# Patient Record
Sex: Female | Born: 1965
Health system: Southern US, Community
[De-identification: ages and names within clinical notes are randomized; demographics above are authoritative.]

## PROBLEM LIST (undated history)

## (undated) DIAGNOSIS — E119 Type 2 diabetes mellitus without complications: Secondary | ICD-10-CM

## (undated) DIAGNOSIS — E669 Obesity, unspecified: Secondary | ICD-10-CM

## (undated) DIAGNOSIS — M7581 Other shoulder lesions, right shoulder: Secondary | ICD-10-CM

## (undated) DIAGNOSIS — F418 Other specified anxiety disorders: Secondary | ICD-10-CM

## (undated) DIAGNOSIS — M79672 Pain in left foot: Secondary | ICD-10-CM

## (undated) DIAGNOSIS — F32A Depression, unspecified: Secondary | ICD-10-CM

## (undated) DIAGNOSIS — N6320 Unspecified lump in the left breast, unspecified quadrant: Secondary | ICD-10-CM

## (undated) DIAGNOSIS — M79671 Pain in right foot: Secondary | ICD-10-CM

## (undated) DIAGNOSIS — E785 Hyperlipidemia, unspecified: Secondary | ICD-10-CM

## (undated) DIAGNOSIS — F329 Major depressive disorder, single episode, unspecified: Secondary | ICD-10-CM

## (undated) DIAGNOSIS — R7303 Prediabetes: Secondary | ICD-10-CM

## (undated) DIAGNOSIS — K635 Polyp of colon: Secondary | ICD-10-CM

## (undated) DIAGNOSIS — K219 Gastro-esophageal reflux disease without esophagitis: Secondary | ICD-10-CM

## (undated) HISTORY — DX: Obesity, unspecified: E66.9

## (undated) HISTORY — DX: Other specified anxiety disorders: F41.8

## (undated) HISTORY — DX: Hyperlipidemia, unspecified: E78.5

## (undated) HISTORY — DX: Type 2 diabetes mellitus without complications: E11.9

## (undated) HISTORY — DX: Other shoulder lesions, right shoulder: M75.81

## (undated) HISTORY — DX: Pain in right foot: M79.672

## (undated) HISTORY — DX: Major depressive disorder, single episode, unspecified: F32.9

## (undated) HISTORY — DX: Depression, unspecified: F32.A

## (undated) HISTORY — DX: Prediabetes: R73.03

## (undated) HISTORY — DX: Polyp of colon: K63.5

## (undated) HISTORY — DX: Gastro-esophageal reflux disease without esophagitis: K21.9

## (undated) HISTORY — DX: Pain in right foot: M79.671

---

## 2011-04-03 DIAGNOSIS — M7581 Other shoulder lesions, right shoulder: Secondary | ICD-10-CM

## 2011-04-03 HISTORY — DX: Other shoulder lesions, right shoulder: M75.81

## 2012-04-02 DIAGNOSIS — K635 Polyp of colon: Secondary | ICD-10-CM

## 2012-04-02 HISTORY — DX: Polyp of colon: K63.5

## 2014-04-02 HISTORY — PX: COLONOSCOPY: SHX174

## 2014-04-02 HISTORY — PX: POLYPECTOMY: SHX149

## 2014-11-09 DIAGNOSIS — S61012A Laceration without foreign body of left thumb without damage to nail, initial encounter: Secondary | ICD-10-CM | POA: Insufficient documentation

## 2014-11-09 DIAGNOSIS — Y929 Unspecified place or not applicable: Secondary | ICD-10-CM | POA: Insufficient documentation

## 2014-11-09 DIAGNOSIS — Y998 Other external cause status: Secondary | ICD-10-CM | POA: Insufficient documentation

## 2014-11-09 DIAGNOSIS — Y9389 Activity, other specified: Secondary | ICD-10-CM | POA: Insufficient documentation

## 2014-11-09 DIAGNOSIS — W260XXA Contact with knife, initial encounter: Secondary | ICD-10-CM | POA: Insufficient documentation

## 2014-11-10 ENCOUNTER — Encounter (HOSPITAL_BASED_OUTPATIENT_CLINIC_OR_DEPARTMENT_OTHER): Payer: Self-pay | Admitting: Emergency Medicine

## 2014-11-10 ENCOUNTER — Emergency Department (HOSPITAL_BASED_OUTPATIENT_CLINIC_OR_DEPARTMENT_OTHER)
Admission: EM | Admit: 2014-11-10 | Discharge: 2014-11-10 | Disposition: A | Discharge status: Home / Self Care | Payer: Self-pay | Attending: Emergency Medicine | Admitting: Emergency Medicine

## 2014-11-10 DIAGNOSIS — S61012A Laceration without foreign body of left thumb without damage to nail, initial encounter: Secondary | ICD-10-CM

## 2014-11-10 NOTE — ED Notes (Signed)
Pt c/o laceration to left thumb by kitchen knife

## 2014-11-10 NOTE — ED Provider Notes (Signed)
CSN: [id number]     Arrival date & time 11/09/14  2359 History   None    No chief complaint on file.    (Consider location/radiation/quality/duration/timing/severity/associated sxs/prior Treatment) Patient is a 49 y.o. female presenting with skin laceration. The history is provided by the patient.  Laceration Location:  Hand Hand laceration location:  L finger Depth:  Cutaneous Quality: straight   Bleeding: controlled   Laceration mechanism:  Knife Pain details:    Quality:  Aching   Severity:  No pain   Timing:  Constant   Progression:  Unchanged Foreign body present:  No foreign bodies Relieved by:  Nothing Worsened by:  Nothing tried Ineffective treatments:  None tried Tetanus status:  Up to date   History reviewed. No pertinent past medical history. No past surgical history on file. History reviewed. No pertinent family history. History  Substance Use Topics  . Smoking status: Not on file  . Smokeless tobacco: Not on file  . Alcohol Use: Not on file   OB History    No data available     Review of Systems  All other systems reviewed and are negative.     Allergies  Review of patient's allergies indicates not on file.  Home Medications   Prior to Admission medications   Not on File   There were no vitals taken for this visit. Physical Exam  Constitutional: She is oriented to person, place, and time. She appears well-developed and well-nourished.  HENT:  Head: Normocephalic and atraumatic.  Mouth/Throat: Oropharynx is clear and moist.  Eyes: Conjunctivae are normal. Pupils are equal, round, and reactive to light.  Neck: Normal range of motion. Neck supple.  Cardiovascular: Normal rate, regular rhythm and intact distal pulses.   Pulmonary/Chest: Effort normal and breath sounds normal. She has no wheezes. She has no rales.  Abdominal: Soft. Bowel sounds are normal. There is no tenderness. There is no rebound and no guarding.  Musculoskeletal: Normal  range of motion.  Neurological: She is alert and oriented to person, place, and time.  Skin: Skin is warm and dry.  1 cm superficial laceration to the tip of the left thumb hemostatic  Cap refill < 2 sec no vascular nor tendon injury    ED Course  Procedures (including critical care time) Labs Review Labs Reviewed - No data to display  Imaging Review No results found.   EKG Interpretation None      MDM   Final diagnoses:  None    LACERATION REPAIR Performed by: Carlisle Beers Authorized by: Carlisle Beers Consent: Verbal consent obtained. Risks and benefits: risks, benefits and alternatives were discussed Consent given by: patient Patient identity confirmed: provided demographic data Prepped and Draped in normal sterile fashion Wound explored  Laceration Location: left thumb tip  Laceration Length: 1 cm  No Foreign Bodies seen or palpated  Irrigation method: syringe Amount of cleaning: standard  Skin closure: dermabond  Patient tolerance: Patient tolerated the procedure well with no immediate complications.     Veatrice Kells, MD 11/10/14 [phone]

## 2015-11-02 ENCOUNTER — Ambulatory Visit (INDEPENDENT_AMBULATORY_CARE_PROVIDER_SITE_OTHER): Payer: Managed Care, Other (non HMO) | Admitting: Family Medicine

## 2015-11-02 ENCOUNTER — Encounter: Payer: Self-pay | Admitting: Family Medicine

## 2015-11-02 VITALS — BP 116/75 | HR 68 | Temp 98.1°F | Resp 20 | Ht 66.0 in | Wt 185.8 lb

## 2015-11-02 DIAGNOSIS — L678 Other hair color and hair shaft abnormalities: Secondary | ICD-10-CM | POA: Insufficient documentation

## 2015-11-02 DIAGNOSIS — Z7689 Persons encountering health services in other specified circumstances: Secondary | ICD-10-CM

## 2015-11-02 DIAGNOSIS — Z136 Encounter for screening for cardiovascular disorders: Secondary | ICD-10-CM

## 2015-11-02 DIAGNOSIS — Z6829 Body mass index (BMI) 29.0-29.9, adult: Secondary | ICD-10-CM

## 2015-11-02 DIAGNOSIS — Z7189 Other specified counseling: Secondary | ICD-10-CM | POA: Diagnosis not present

## 2015-11-02 DIAGNOSIS — L682 Localized hypertrichosis: Secondary | ICD-10-CM | POA: Diagnosis not present

## 2015-11-02 DIAGNOSIS — Z86010 Personal history of colonic polyps: Secondary | ICD-10-CM | POA: Insufficient documentation

## 2015-11-02 DIAGNOSIS — R7303 Prediabetes: Secondary | ICD-10-CM | POA: Insufficient documentation

## 2015-11-02 DIAGNOSIS — Z1329 Encounter for screening for other suspected endocrine disorder: Secondary | ICD-10-CM

## 2015-11-02 DIAGNOSIS — Z13220 Encounter for screening for lipoid disorders: Secondary | ICD-10-CM

## 2015-11-02 DIAGNOSIS — E669 Obesity, unspecified: Secondary | ICD-10-CM | POA: Insufficient documentation

## 2015-11-02 LAB — POCT GLYCOSYLATED HEMOGLOBIN (HGB A1C): HEMOGLOBIN A1C: 5.7

## 2015-11-02 NOTE — Progress Notes (Signed)
Patient ID: Kristy Cooper, female  DOB: July 01, 1965, 50 y.o.   MRN: [id number] Patient Care Team    Relationship Specialty Notifications Start End  [name], DO PCP - General Family Medicine  11/02/15     Subjective:  Kristy Cooper is a 50 y.o.  female present for new patient establishment. All past medical history, surgical history, allergies, family history, immunizations, medications and social history were obtained and entered in the electronic medical record today. All records reviewed/requested.  All recent labs, ED visits and hospitalizations within the last year were reviewed.  Eye doctor: [facility] 10/2015  Prediabetes: pt states her last a1c was about a year ago and was around 6.5. She was seen at a "free" clinic in [place] secondary to losing her insurance coverage. She has been closely watching her diet and exercising. Her and her husband have been lost significant weight over the last few months. Pt denies nonhealing wounds, tingling in extremities, dizziness. She endorses increased facial hair. Her menstrual cycle is very light, about 3 days in length as well.    Health maintenance:  Colonoscopy: completed: 2016 through [facility], by polyp removed,  follow up 2-3 years. Fhx of colon cancer.  Mammogram: completed: 07/2014, birads 1. Fhx of breast cancer.  Cervical cancer screening: last pap: 2015, results: normal, completed by: PCP. Immunizations: tdap 2013, Influenza (encouraged yearly) Infectious disease screening: unknown DEXA: unknown  Immunization History  Administered Date(s) Administered  . Influenza-Unspecified 03/12/2012  . Tdap 08/16/2011     Past Medical History:  Diagnosis Date  . Bilateral foot pain    chronic- told arthritis and was prescribed tramadol.   . Colon polyp 2014   tubular adenoma  . Depression   . Depression with anxiety   . GERD (gastroesophageal reflux disease)   . Obesity   . Prediabetes   .  Right rotator cuff tendinitis 2013   No Known Allergies Past Surgical History:  Procedure Laterality Date  . CESAREAN SECTION     Family History  Problem Relation Age of Onset  . Breast cancer Mother   . Diabetes Mother   . Mental illness Mother   . Heart disease Mother   . Arthritis Mother   . Arthritis Father   . Mental illness Father   . Breast cancer Maternal Aunt   . Diabetes Maternal Aunt   . Arthritis Maternal Aunt   . Colon cancer Maternal Uncle   . Diabetes Maternal Uncle   . Colon cancer Paternal Aunt   . Diabetes Paternal Aunt   . Heart disease Paternal Aunt    Social History   Social History  . Marital status: Married    Spouse name: N/A  . Number of children: N/A  . Years of education: N/A   Occupational History  . Not on file.   Social History Main Topics  . Smoking status: Former Research scientist (life sciences)  . Smokeless tobacco: Never Used  . Alcohol use No  . Drug use: No  . Sexual activity: Yes    Birth control/ protection: None   Other Topics Concern  . Not on file   Social History Narrative   Married to [place]. 1 child Bubba Hales).   HS grad. Artist.         Medication List    as of 11/02/2015  6:52 PM   You have not been prescribed any medications.     Recent Results (from the past 2160 hour(s))  POCT HgB  A1C     Status: Normal   Collection Time: 11/02/15  1:43 PM  Result Value Ref Range   Hemoglobin A1C 5.7     No results found.  ROS: 14 pt review of systems performed and negative (unless mentioned in an HPI)  Objective: BP 116/75 (BP Location: Right Arm, Patient Position: Sitting, Cuff Size: Large)   Pulse 68   Temp 98.1 F (36.7 C) (Oral)   Resp 20   Ht 5\' 6"  (1.676 m)   Wt 185 lb 12 oz (84.3 kg)   LMP 11/01/2015 (Exact Date)   SpO2 98%   BMI 29.98 kg/m  Gen: Afebrile. No acute distress. Nontoxic in appearance, well-developed, well-nourished,  Pleasant caucasian female.  HENT: AT. [place]. MMM. Eyes:Pupils Equal Round Reactive to light,  Extraocular movements intact,  Conjunctiva without redness, discharge or icterus. Neck/lymp/endocrine: Supple,no lymphadenopathy, no thyromegaly CV: RRR, no edema Chest: CTAB, no wheeze, rhonchi or crackles.  Abd: Soft. NTND. BS present. Skin: Warm and well-perfused. Neuro/Msk: Normal gait. PERLA. EOMi. Alert. Oriented x3.   Psych: Normal affect, dress and demeanor. Normal speech. Normal thought content and judgment.  Assessment/plan: Kristy Cooper is a 50 y.o. female present for establishment of Prediabetes/BMI 25/abnl facial hair - 5.7 a1c today. Will follow again in 6 months to ensure continued down trend with weight loss.  - continue diet/exercise, congratulated on weight loss.  - POCT HgB A1C - facial hair: currently she is plucking frequently. Dicussed hormonal changes briefly and cosmetic treatments. Pt had questions about a cream, which would be cosmetic and price is ~190 for a small container when looked up. She decided to think about it. - 6 mos f/u for prediabetes.  - CPE with in 3 weeks.   Greater than 45 minutes was spent with patient, greater than 50% of that time was spent face-to-face with patient counseling and coordinating care.   Future labs placed for CPE  Return in about 3 weeks (around 11/23/2015) for CPE.  Electronically signed by: Howard Pouch, DO [place]

## 2015-11-02 NOTE — Patient Instructions (Signed)
It was nice to meet you Kristy Cooper.  I will check a1c today.  I will get records from GI and [place] clinic. Please schedule physical within 2-4 weeks. You can get labs 2 days prior to OV, so you can be fasting. If you desire to do this you will need to make a lab appt and provider appt 2 days later.

## 2015-11-18 ENCOUNTER — Other Ambulatory Visit (INDEPENDENT_AMBULATORY_CARE_PROVIDER_SITE_OTHER): Payer: Managed Care, Other (non HMO)

## 2015-11-18 DIAGNOSIS — Z6829 Body mass index (BMI) 29.0-29.9, adult: Secondary | ICD-10-CM

## 2015-11-18 DIAGNOSIS — R7989 Other specified abnormal findings of blood chemistry: Secondary | ICD-10-CM

## 2015-11-18 DIAGNOSIS — R7303 Prediabetes: Secondary | ICD-10-CM | POA: Diagnosis not present

## 2015-11-18 DIAGNOSIS — Z136 Encounter for screening for cardiovascular disorders: Secondary | ICD-10-CM

## 2015-11-18 DIAGNOSIS — Z13220 Encounter for screening for lipoid disorders: Secondary | ICD-10-CM

## 2015-11-18 DIAGNOSIS — L678 Other hair color and hair shaft abnormalities: Secondary | ICD-10-CM

## 2015-11-18 DIAGNOSIS — Z1329 Encounter for screening for other suspected endocrine disorder: Secondary | ICD-10-CM

## 2015-11-18 DIAGNOSIS — L682 Localized hypertrichosis: Secondary | ICD-10-CM

## 2015-11-18 LAB — CBC WITH DIFFERENTIAL/PLATELET
BASOS PCT: 0.9 % (ref 0.0–3.0)
Basophils Absolute: 0.1 10*3/uL (ref 0.0–0.1)
EOS PCT: 2.8 % (ref 0.0–5.0)
Eosinophils Absolute: 0.3 10*3/uL (ref 0.0–0.7)
HCT: 37.9 % (ref 36.0–46.0)
HEMOGLOBIN: 12.2 g/dL (ref 12.0–15.0)
LYMPHS PCT: 21.2 % (ref 12.0–46.0)
Lymphs Abs: 2.1 10*3/uL (ref 0.7–4.0)
MCHC: 32.2 g/dL (ref 30.0–36.0)
MCV: 80.7 fl (ref 78.0–100.0)
Monocytes Absolute: 0.5 10*3/uL (ref 0.1–1.0)
Monocytes Relative: 4.6 % (ref 3.0–12.0)
NEUTROS ABS: 7.1 10*3/uL (ref 1.4–7.7)
Neutrophils Relative %: 70.5 % (ref 43.0–77.0)
PLATELETS: 400 10*3/uL (ref 150.0–400.0)
RBC: 4.69 Mil/uL (ref 3.87–5.11)
RDW: 15.1 % (ref 11.5–15.5)
WBC: 10 10*3/uL (ref 4.0–10.5)

## 2015-11-18 LAB — COMPREHENSIVE METABOLIC PANEL
ALT: 8 U/L (ref 0–35)
AST: 13 U/L (ref 0–37)
Albumin: 4.2 g/dL (ref 3.5–5.2)
Alkaline Phosphatase: 70 U/L (ref 39–117)
BUN: 13 mg/dL (ref 6–23)
CHLORIDE: 106 meq/L (ref 96–112)
CO2: 26 meq/L (ref 19–32)
Calcium: 9.3 mg/dL (ref 8.4–10.5)
Creatinine, Ser: 0.71 mg/dL (ref 0.40–1.20)
GFR: 92.61 mL/min (ref >60.00)
Glucose, Bld: 104 mg/dL — ABNORMAL HIGH (ref 70–99)
POTASSIUM: 4.3 meq/L (ref 3.5–5.1)
Sodium: 140 mEq/L (ref 135–145)
Total Bilirubin: 0.3 mg/dL (ref 0.2–1.2)
Total Protein: 6.9 g/dL (ref 6.0–8.3)

## 2015-11-18 LAB — LIPID PANEL
CHOL/HDL RATIO: 7
Cholesterol: 241 mg/dL — ABNORMAL HIGH (ref 0–200)
HDL: 36 mg/dL — AB (ref >39.00)
NONHDL: 204.87
TRIGLYCERIDES: 306 mg/dL — AB (ref 0.0–149.0)
VLDL: 61.2 mg/dL — ABNORMAL HIGH (ref 0.0–40.0)

## 2015-11-18 LAB — LDL CHOLESTEROL, DIRECT: Direct LDL: 152 mg/dL

## 2015-11-18 LAB — TSH: TSH: 2.33 u[IU]/mL (ref 0.35–4.50)

## 2015-11-21 ENCOUNTER — Other Ambulatory Visit: Payer: Managed Care, Other (non HMO)

## 2015-11-22 ENCOUNTER — Telehealth: Payer: Self-pay | Admitting: Family Medicine

## 2015-11-22 NOTE — Telephone Encounter (Signed)
Opened in error

## 2015-11-23 ENCOUNTER — Ambulatory Visit (INDEPENDENT_AMBULATORY_CARE_PROVIDER_SITE_OTHER): Payer: Managed Care, Other (non HMO) | Admitting: Family Medicine

## 2015-11-23 ENCOUNTER — Encounter: Payer: Self-pay | Admitting: Family Medicine

## 2015-11-23 VITALS — BP 115/76 | HR 66 | Temp 98.1°F | Resp 20 | Ht 66.0 in | Wt 185.5 lb

## 2015-11-23 DIAGNOSIS — E785 Hyperlipidemia, unspecified: Secondary | ICD-10-CM | POA: Diagnosis not present

## 2015-11-23 DIAGNOSIS — R6889 Other general symptoms and signs: Secondary | ICD-10-CM

## 2015-11-23 DIAGNOSIS — Z1231 Encounter for screening mammogram for malignant neoplasm of breast: Secondary | ICD-10-CM

## 2015-11-23 DIAGNOSIS — E781 Pure hyperglyceridemia: Secondary | ICD-10-CM | POA: Diagnosis not present

## 2015-11-23 DIAGNOSIS — Z0001 Encounter for general adult medical examination with abnormal findings: Secondary | ICD-10-CM | POA: Insufficient documentation

## 2015-11-23 NOTE — Progress Notes (Signed)
Patient ID: Kristy Cooper, female  DOB: May 31, 1965, 50 y.o.   MRN: [id number] Patient Care Team    Relationship Specialty Notifications Start End  [name], DO PCP - General Family Medicine  11/02/15     Subjective:  Kristy Cooper is a 50 y.o.  Female  present for CPE. All past medical history, surgical history, allergies, family history, immunizations, medications and social history were updated in the electronic medical record today. All recent labs, ED visits and hospitalizations within the last year were reviewed. Period abnormal Mole left side Health maintenance:  Colonoscopy: completed: 2016 through [facility], by polyp removed,  follow up 2-3 years. Fhx of colon cancer.  Mammogram: completed: 07/2014, birads 1. Fhx of breast cancer mother and aunt. Yearly encouraged.  Cervical cancer screening: last pap: 2015, results: normal, completed by: PCP to complete in 2018.  Immunizations: tdap 2013, Influenza (encouraged yearly) Infectious disease screening: unknown DEXA: Screen at 60 Assistive device: None Oxygen use: none  Patient has a Dental home. Hospitalizations/ED visits: None  Immunization History  Administered Date(s) Administered  . Influenza-Unspecified 03/12/2012  . Tdap 08/16/2011     Past Medical History:  Diagnosis Date  . Bilateral foot pain    chronic- told arthritis and was prescribed tramadol.   . Colon polyp 2014   tubular adenoma  . Depression   . Depression with anxiety   . GERD (gastroesophageal reflux disease)   . Obesity   . Prediabetes   . Right rotator cuff tendinitis 2013   No Known Allergies Past Surgical History:  Procedure Laterality Date  . CESAREAN SECTION     Family History  Problem Relation Age of Onset  . Breast cancer Mother   . Diabetes Mother   . Mental illness Mother   . Heart disease Mother   . Arthritis Mother   . Arthritis Father   . Mental illness Father   . Breast cancer Maternal Aunt   .  Diabetes Maternal Aunt   . Arthritis Maternal Aunt   . Colon cancer Maternal Uncle   . Diabetes Maternal Uncle   . Colon cancer Paternal Aunt   . Diabetes Paternal Aunt   . Heart disease Paternal Aunt    Social History   Social History  . Marital status: Married    Spouse name: N/A  . Number of children: N/A  . Years of education: N/A   Occupational History  . Not on file.   Social History Main Topics  . Smoking status: Former Research scientist (life sciences)  . Smokeless tobacco: Never Used  . Alcohol use No  . Drug use: No  . Sexual activity: Yes    Partners: Male    Birth control/ protection: None     Comment: married   Other Topics Concern  . Not on file   Social History Narrative   Married to [place]. 2 chilrn Bubba Hales)- special needs and an adult child. She also has a grandchild.    HS grad. Artist.   Drinks caffeine. Takes daily vitamin.   Wears seatbelt. Wears bicycle helmet. Smoke detector in the home.    Exercises routinely.    Feels safe in relationships.             Medication List    as of 11/23/2015  8:42 PM   You have not been prescribed any medications.      Recent Results (from the past 2160 hour(s))  POCT HgB A1C     Status: Normal  Collection Time: 11/02/15  1:43 PM  Result Value Ref Range   Hemoglobin A1C 5.7   CBC w/Diff     Status: None   Collection Time: 11/18/15  8:14 AM  Result Value Ref Range   WBC 10.0 4.0 - 10.5 K/uL   RBC 4.69 3.87 - 5.11 Mil/uL   Hemoglobin 12.2 12.0 - 15.0 g/dL   HCT 37.9 36.0 - 46.0 %   MCV 80.7 78.0 - 100.0 fl   MCHC 32.2 30.0 - 36.0 g/dL   RDW 15.1 11.5 - 15.5 %   Platelets 400.0 150.0 - 400.0 K/uL   Neutrophils Relative % 70.5 43.0 - 77.0 %   Lymphocytes Relative 21.2 12.0 - 46.0 %   Monocytes Relative 4.6 3.0 - 12.0 %   Eosinophils Relative 2.8 0.0 - 5.0 %   Basophils Relative 0.9 0.0 - 3.0 %   Neutro Abs 7.1 1.4 - 7.7 K/uL   Lymphs Abs 2.1 0.7 - 4.0 K/uL   Monocytes Absolute 0.5 0.1 - 1.0 K/uL   Eosinophils  Absolute 0.3 0.0 - 0.7 K/uL   Basophils Absolute 0.1 0.0 - 0.1 K/uL  Comp Met (CMET)     Status: Abnormal   Collection Time: 11/18/15  8:14 AM  Result Value Ref Range   Sodium 140 135 - 145 mEq/L   Potassium 4.3 3.5 - 5.1 mEq/L   Chloride 106 96 - 112 mEq/L   CO2 26 19 - 32 mEq/L   Glucose, Bld 104 (H) 70 - 99 mg/dL   BUN 13 6 - 23 mg/dL   Creatinine, Ser 0.71 0.40 - 1.20 mg/dL   Total Bilirubin 0.3 0.2 - 1.2 mg/dL   Alkaline Phosphatase 70 39 - 117 U/L   AST 13 0 - 37 U/L   ALT 8 0 - 35 U/L   Total Protein 6.9 6.0 - 8.3 g/dL   Albumin 4.2 3.5 - 5.2 g/dL   Calcium 9.3 8.4 - 10.5 mg/dL   GFR 92.61 >60.00 mL/min  Lipid panel     Status: Abnormal   Collection Time: 11/18/15  8:14 AM  Result Value Ref Range   Cholesterol 241 (H) 0 - 200 mg/dL    Comment: ATP III Classification       Desirable:  < 200 mg/dL               Borderline High:  200 - 239 mg/dL          High:  > = 240 mg/dL   Triglycerides 306.0 (H) 0.0 - 149.0 mg/dL    Comment: Normal:  <150 mg/dLBorderline High:  150 - 199 mg/dL   HDL 36.00 (L) >39.00 mg/dL   VLDL 61.2 (H) 0.0 - 40.0 mg/dL   Total CHOL/HDL Ratio 7     Comment:                Men          Women1/2 Average Risk     3.4          3.3Average Risk          5.0          4.42X Average Risk          9.6          7.13X Average Risk          15.0          11.0  NonHDL 204.87     Comment: NOTE:  Non-HDL goal should be 30 mg/dL higher than patient's LDL goal (i.e. LDL goal of < 70 mg/dL, would have non-HDL goal of < 100 mg/dL)  TSH     Status: None   Collection Time: 11/18/15  8:14 AM  Result Value Ref Range   TSH 2.33 0.35 - 4.50 uIU/mL  LDL cholesterol, direct     Status: None   Collection Time: 11/18/15  8:14 AM  Result Value Ref Range   Direct LDL 152.0 mg/dL    Comment: Optimal:  <100 mg/dLNear or Above Optimal:  100-129 mg/dLBorderline High:  130-159 mg/dLHigh:  160-189 mg/dLVery High:  >190 mg/dL    No results found.   ROS: 14 pt  review of systems performed and negative (unless mentioned in an HPI)  Objective: BP 115/76 (BP Location: Left Arm, Patient Position: Sitting, Cuff Size: Large)   Pulse 66   Temp 98.1 F (36.7 C) (Oral)   Resp 20   Ht _0  (1.676 m)   Wt 185 lb 8 oz (84.1 kg)   LMP 11/15/2015   SpO2 98%   BMI 29.94 kg/m  Gen: Afebrile. No acute distress. Nontoxic in appearance, well-developed, well-nourished,  pleasant female. HENT: AT. [place]. Bilateral TM visualized and normal in appearance, normal external auditory canal. MMM, no oral lesions, adequate dentition. Bilateral nares within normal limits. Throat without erythema, ulcerations or exudates. No Cough on exam, no hoarseness on exam. Eyes:Pupils Equal Round Reactive to light, Extraocular movements intact,  Conjunctiva without redness, discharge or icterus. Neck/lymp/endocrine: Supple, no lymphadenopathy, no thyromegaly CV: RRR no murmur, no edema, +2/4 P posterior tibialis pulses. Chest: CTAB, no wheeze, rhonchi or crackles. Normal Respiratory effort. Good Air movement. Abd: Soft. Round. NTND. BS present. No Masses palpated. No hepatosplenomegaly. No rebound tenderness or guarding. Skin: No rashes, purpura or petechiae. Warm and well-perfused. Skin intact. Neuro/Msk:  Normal gait. PERLA. EOMi. Alert. Oriented x3.  Cranial nerves II through XII intact. Muscle strength 5/5 upper/lower extremity. DTRs equal bilaterally. Psych: Normal affect, dress and demeanor. Normal speech. Normal thought content and judgment.  Assessment/plan: Kristy Cooper is a 50 y.o. female present for CPE without Pap Encounter for preventative adult health care exam with abnormal findings Patient was encouraged to exercise greater than 150 minutes a week. Patient was encouraged to choose a diet filled with fresh fruits and vegetables, and lean meats. AVS provided to patient today for education/recommendation on gender specific health and safety maintenance. Colonoscopy:  completed: 2016 through [facility], by polyp removed,  follow up 2-3 years. Fhx of colon cancer.  Mammogram: completed: 07/2014, birads 1. Fhx of breast cancer mother and aunt. Yearly encouraged. Order placed today. Cervical cancer screening: last pap: 2015, results: normal, completed by: PCP to complete in 2018.  Immunizations: tdap 2013, Influenza (encouraged yearly) Infectious disease screening: unknown DEXA: unknown Encounter for screening mammogram for breast cancer - [facility] DIGITAL SCREENING BILATERAL; Future  Hyperlipidemia/hypertriglyceridemia: Patient encouraged to start 2 g of fish oil daily. Will recheck cholesterol and A1c in 6 months.    Return in about 6 months (around 05/25/2016), or a1c and cholesterol.  Electronically signed by: Howard Pouch, DO [place]

## 2015-11-23 NOTE — Patient Instructions (Signed)
Vit d 600-800u and calcium 1000-1200 mg a day Fish oil 2g  mammogram ordered Information on the mole below, if you want it off to test make "procedure appt 30 minutes"   Seborrheic Keratosis Seborrheic keratosis is a common, noncancerous (benign) skin growth. This condition causes waxy, rough, tan, brown, or black spots to appear on the skin. These skin growths can be flat or raised. CAUSES The cause of this condition is not known. RISK FACTORS This condition is more likely to develop in:  People who have a family history of seborrheic keratosis.  People who are 68 or older.  People who are pregnant.  People who have had estrogen replacement therapy. SYMPTOMS This condition often occurs on the face, chest, shoulders, back, or other areas. These growths:  Are usually painless, but may become irritated and itchy.  Can be yellow, brown, black, or other colors.  Are slightly raised or have a flat surface.  Are sometimes rough or wart-like in texture.  Are often waxy on the surface.  Are round or oval-shaped.  Sometimes look like they are "stuck on."  Often occur in groups, but may occur as a single growth. DIAGNOSIS This condition is diagnosed with a medical history and physical exam. A sample of the growth may be tested (skin biopsy). You may need to see a skin specialist (dermatologist). TREATMENT Treatment is not usually needed for this condition, unless the growths are irritated or are often bleeding. You may also choose to have the growths removed if you do not like their appearance. Most commonly, these growths are treated with a procedure in which liquid nitrogen is applied to "freeze" off the growth (cryosurgery). They may also be burned off with electricity or cut off. HOME CARE INSTRUCTIONS  Watch your growth for any changes.  Keep all follow-up visits as told by your health care provider. This is important.  Do not scratch or pick at the growth or growths. This  can cause them to become irritated or infected. SEEK MEDICAL CARE IF:  You suddenly have many new growths.  Your growth bleeds, itches, or hurts.  Your growth suddenly becomes larger or changes color.   This information is not intended to replace advice given to you by your health care provider. Make sure you discuss any questions you have with your health care provider.   Document Released: 04/21/2010 Document Revised: 12/08/2014 Document Reviewed: 08/04/2014 Elsevier Interactive Patient Education 2016 [employer].   Health Maintenance, Female Adopting a healthy lifestyle and getting preventive care can go a long way to promote health and wellness. Talk with your health care provider about what schedule of regular examinations is right for you. This is a good chance for you to check in with your provider about disease prevention and staying healthy. In between checkups, there are plenty of things you can do on your own. Experts have done a lot of research about which lifestyle changes and preventive measures are most likely to keep you healthy. Ask your health care provider for more information. WEIGHT AND DIET  Eat a healthy diet  Be sure to include plenty of vegetables, fruits, low-fat dairy products, and lean protein.  Do not eat a lot of foods high in solid fats, added sugars, or salt.  Get regular exercise. This is one of the most important things you can do for your health.  Most adults should exercise for at least 150 minutes each week. The exercise should increase your heart rate and make you  sweat (moderate-intensity exercise).  Most adults should also do strengthening exercises at least twice a week. This is in addition to the moderate-intensity exercise.  Maintain a healthy weight  Body mass index (BMI) is a measurement that can be used to identify possible weight problems. It estimates body fat based on height and weight. Your health care provider can help determine  your BMI and help you achieve or maintain a healthy weight.  For females 37 years of age and older:   A BMI below 18.5 is considered underweight.  A BMI of 18.5 to 24.9 is normal.  A BMI of 25 to 29.9 is considered overweight.  A BMI of 30 and above is considered obese.  Watch levels of cholesterol and blood lipids  You should start having your blood tested for lipids and cholesterol at 50 years of age, then have this test every 5 years.  You may need to have your cholesterol levels checked more often if:  Your lipid or cholesterol levels are high.  You are older than 50 years of age.  You are at high risk for heart disease.  CANCER SCREENING   Lung Cancer  Lung cancer screening is recommended for adults 39-108 years old who are at high risk for lung cancer because of a history of smoking.  A yearly low-dose CT scan of the lungs is recommended for people who:  Currently smoke.  Have quit within the past 15 years.  Have at least a 30-pack-year history of smoking. A pack year is smoking an average of one pack of cigarettes a day for 1 year.  Yearly screening should continue until it has been 15 years since you quit.  Yearly screening should stop if you develop a health problem that would prevent you from having lung cancer treatment.  Breast Cancer  Practice breast self-awareness. This means understanding how your breasts normally appear and feel.  It also means doing regular breast self-exams. Let your health care provider know about any changes, no matter how small.  If you are in your 20s or 30s, you should have a clinical breast exam (CBE) by a health care provider every 1-3 years as part of a regular health exam.  If you are 17 or older, have a CBE every year. Also consider having a breast X-ray (mammogram) every year.  If you have a family history of breast cancer, talk to your health care provider about genetic screening.  If you are at high risk for breast  cancer, talk to your health care provider about having an MRI and a mammogram every year.  Breast cancer gene (BRCA) assessment is recommended for women who have family members with BRCA-related cancers. BRCA-related cancers include:  Breast.  Ovarian.  Tubal.  Peritoneal cancers.  Results of the assessment will determine the need for genetic counseling and BRCA1 and BRCA2 testing. Cervical Cancer Your health care provider may recommend that you be screened regularly for cancer of the pelvic organs (ovaries, uterus, and vagina). This screening involves a pelvic examination, including checking for microscopic changes to the surface of your cervix (Pap test). You may be encouraged to have this screening done every 3 years, beginning at age 51.  For women ages 77-65, health care providers may recommend pelvic exams and Pap testing every 3 years, or they may recommend the Pap and pelvic exam, combined with testing for human papilloma virus (HPV), every 5 years. Some types of HPV increase your risk of cervical cancer. Testing for HPV  may also be done on women of any age with unclear Pap test results.  Other health care providers may not recommend any screening for nonpregnant women who are considered low risk for pelvic cancer and who do not have symptoms. Ask your health care provider if a screening pelvic exam is right for you.  If you have had past treatment for cervical cancer or a condition that could lead to cancer, you need Pap tests and screening for cancer for at least 20 years after your treatment. If Pap tests have been discontinued, your risk factors (such as having a new sexual partner) need to be reassessed to determine if screening should resume. Some women have medical problems that increase the chance of getting cervical cancer. In these cases, your health care provider may recommend more frequent screening and Pap tests. Colorectal Cancer  This type of cancer can be detected and  often prevented.  Routine colorectal cancer screening usually begins at 50 years of age and continues through 50 years of age.  Your health care provider may recommend screening at an earlier age if you have risk factors for colon cancer.  Your health care provider may also recommend using home test kits to check for hidden blood in the stool.  A small camera at the end of a tube can be used to examine your colon directly (sigmoidoscopy or colonoscopy). This is done to check for the earliest forms of colorectal cancer.  Routine screening usually begins at age 33.  Direct examination of the colon should be repeated every 5-10 years through 50 years of age. However, you may need to be screened more often if early forms of precancerous polyps or small growths are found. Skin Cancer  Check your skin from head to toe regularly.  Tell your health care provider about any new moles or changes in moles, especially if there is a change in a mole's shape or color.  Also tell your health care provider if you have a mole that is larger than the size of a pencil eraser.  Always use sunscreen. Apply sunscreen liberally and repeatedly throughout the day.  Protect yourself by wearing long sleeves, pants, a wide-brimmed hat, and sunglasses whenever you are outside. HEART DISEASE, DIABETES, AND HIGH BLOOD PRESSURE   High blood pressure causes heart disease and increases the risk of stroke. High blood pressure is more likely to develop in:  People who have blood pressure in the high end of the normal range (130-139/85-89 mm Hg).  People who are overweight or obese.  People who are African American.  If you are 10-27 years of age, have your blood pressure checked every 3-5 years. If you are 19 years of age or older, have your blood pressure checked every year. You should have your blood pressure measured twice--once when you are at a hospital or clinic, and once when you are not at a hospital or clinic.  Record the average of the two measurements. To check your blood pressure when you are not at a hospital or clinic, you can use:  An automated blood pressure machine at a pharmacy.  A home blood pressure monitor.  If you are between 62 years and 79 years old, ask your health care provider if you should take aspirin to prevent strokes.  Have regular diabetes screenings. This involves taking a blood sample to check your fasting blood sugar level.  If you are at a normal weight and have a low risk for diabetes, have this  test once every three years after 50 years of age.  If you are overweight and have a high risk for diabetes, consider being tested at a younger age or more often. PREVENTING INFECTION  Hepatitis B  If you have a higher risk for hepatitis B, you should be screened for this virus. You are considered at high risk for hepatitis B if:  You were born in a country where hepatitis B is common. Ask your health care provider which countries are considered high risk.  Your parents were born in a high-risk country, and you have not been immunized against hepatitis B (hepatitis B vaccine).  You have HIV or AIDS.  You use needles to inject street drugs.  You live with someone who has hepatitis B.  You have had sex with someone who has hepatitis B.  You get hemodialysis treatment.  You take certain medicines for conditions, including cancer, organ transplantation, and autoimmune conditions. Hepatitis C  Blood testing is recommended for:  Everyone born from [birth year] through 1965.  Anyone with known risk factors for hepatitis C. Sexually transmitted infections (STIs)  You should be screened for sexually transmitted infections (STIs) including gonorrhea and chlamydia if:  You are sexually active and are younger than 50 years of age.  You are older than 50 years of age and your health care provider tells you that you are at risk for this type of infection.  Your sexual activity  has changed since you were last screened and you are at an increased risk for chlamydia or gonorrhea. Ask your health care provider if you are at risk.  If you do not have HIV, but are at risk, it may be recommended that you take a prescription medicine daily to prevent HIV infection. This is called pre-exposure prophylaxis (PrEP). You are considered at risk if:  You are sexually active and do not regularly use condoms or know the HIV status of your partner(s).  You take drugs by injection.  You are sexually active with a partner who has HIV. Talk with your health care provider about whether you are at high risk of being infected with HIV. If you choose to begin PrEP, you should first be tested for HIV. You should then be tested every 3 months for as long as you are taking PrEP.  PREGNANCY   If you are premenopausal and you may become pregnant, ask your health care provider about preconception counseling.  If you may become pregnant, take 400 to 800 micrograms (mcg) of folic acid every day.  If you want to prevent pregnancy, talk to your health care provider about birth control (contraception). OSTEOPOROSIS AND MENOPAUSE   Osteoporosis is a disease in which the bones lose minerals and strength with aging. This can result in serious bone fractures. Your risk for osteoporosis can be identified using a bone density scan.  If you are 67 years of age or older, or if you are at risk for osteoporosis and fractures, ask your health care provider if you should be screened.  Ask your health care provider whether you should take a calcium or vitamin D supplement to lower your risk for osteoporosis.  Menopause may have certain physical symptoms and risks.  Hormone replacement therapy may reduce some of these symptoms and risks. Talk to your health care provider about whether hormone replacement therapy is right for you.  HOME CARE INSTRUCTIONS   Schedule regular health, dental, and eye  exams.  Stay current with your immunizations.  Do not use any tobacco products including cigarettes, chewing tobacco, or electronic cigarettes.  If you are pregnant, do not drink alcohol.  If you are breastfeeding, limit how much and how often you drink alcohol.  Limit alcohol intake to no more than 1 drink per day for nonpregnant women. One drink equals 12 ounces of beer, 5 ounces of wine, or 1 ounces of hard liquor.  Do not use street drugs.  Do not share needles.  Ask your health care provider for help if you need support or information about quitting drugs.  Tell your health care provider if you often feel depressed.  Tell your health care provider if you have ever been abused or do not feel safe at home.   This information is not intended to replace advice given to you by your health care provider. Make sure you discuss any questions you have with your health care provider.   Document Released: 10/02/2010 Document Revised: 04/09/2014 Document Reviewed: 02/18/2013 Elsevier Interactive Patient Education [employer].

## 2016-01-02 ENCOUNTER — Other Ambulatory Visit: Payer: Self-pay | Admitting: Family Medicine

## 2016-01-02 DIAGNOSIS — Z1231 Encounter for screening mammogram for malignant neoplasm of breast: Secondary | ICD-10-CM

## 2016-01-18 ENCOUNTER — Ambulatory Visit
Admission: RE | Admit: 2016-01-18 | Discharge: 2016-01-18 | Disposition: A | Discharge status: Home / Self Care | Payer: Managed Care, Other (non HMO) | Source: Ambulatory Visit | Attending: Family Medicine | Admitting: Family Medicine

## 2016-01-18 DIAGNOSIS — Z1231 Encounter for screening mammogram for malignant neoplasm of breast: Secondary | ICD-10-CM

## 2016-02-28 ENCOUNTER — Encounter: Payer: Self-pay | Admitting: Family Medicine

## 2016-02-28 ENCOUNTER — Ambulatory Visit (INDEPENDENT_AMBULATORY_CARE_PROVIDER_SITE_OTHER): Payer: Managed Care, Other (non HMO) | Admitting: Family Medicine

## 2016-02-28 VITALS — BP 129/82 | HR 83 | Temp 98.0°F | Resp 20 | Wt 189.8 lb

## 2016-02-28 DIAGNOSIS — G244 Idiopathic orofacial dystonia: Secondary | ICD-10-CM | POA: Diagnosis not present

## 2016-02-28 DIAGNOSIS — R7309 Other abnormal glucose: Secondary | ICD-10-CM | POA: Diagnosis not present

## 2016-02-28 DIAGNOSIS — G514 Facial myokymia: Secondary | ICD-10-CM | POA: Diagnosis not present

## 2016-02-28 DIAGNOSIS — R700 Elevated erythrocyte sedimentation rate: Secondary | ICD-10-CM

## 2016-02-28 LAB — CBC WITH DIFFERENTIAL/PLATELET
BASOS ABS: 0 {cells}/uL (ref 0–200)
Basophils Relative: 0 %
EOS ABS: 180 {cells}/uL (ref 15–500)
EOS PCT: 2 %
HEMATOCRIT: 40.1 % (ref 35.0–45.0)
HEMOGLOBIN: 13.1 g/dL (ref 11.7–15.5)
LYMPHS ABS: 1350 {cells}/uL (ref 850–3900)
Lymphocytes Relative: 15 %
MCH: 26.2 pg — AB (ref 27.0–33.0)
MCHC: 32.7 g/dL (ref 32.0–36.0)
MCV: 80.2 fL (ref 80.0–100.0)
MPV: 9 fL (ref 7.5–12.5)
Monocytes Absolute: 450 cells/uL (ref 200–950)
Monocytes Relative: 5 %
NEUTROS PCT: 78 %
Neutro Abs: 7020 cells/uL (ref 1500–7800)
Platelets: 330 10*3/uL (ref 140–400)
RBC: 5 MIL/uL (ref 3.80–5.10)
RDW: 15.3 % — ABNORMAL HIGH (ref 11.0–15.0)
WBC: 9 10*3/uL (ref 3.8–10.8)

## 2016-02-28 LAB — COMPREHENSIVE METABOLIC PANEL
ALT: 9 U/L (ref 0–35)
AST: 15 U/L (ref 0–37)
Albumin: 4.5 g/dL (ref 3.5–5.2)
Alkaline Phosphatase: 76 U/L (ref 39–117)
BUN: 15 mg/dL (ref 6–23)
CO2: 25 meq/L (ref 19–32)
Calcium: 9.4 mg/dL (ref 8.4–10.5)
Chloride: 103 mEq/L (ref 96–112)
Creatinine, Ser: 0.72 mg/dL (ref 0.40–1.20)
GFR: 91.03 mL/min (ref >60.00)
GLUCOSE: 204 mg/dL — AB (ref 70–99)
POTASSIUM: 4.1 meq/L (ref 3.5–5.1)
SODIUM: 137 meq/L (ref 135–145)
TOTAL PROTEIN: 7.5 g/dL (ref 6.0–8.3)
Total Bilirubin: 0.3 mg/dL (ref 0.2–1.2)

## 2016-02-28 MED ORDER — BACLOFEN 20 MG PO TABS: 20.0000 mg | ORAL_TABLET | Freq: Three times a day (TID) | ORAL | 0 refills | Status: DC

## 2016-02-28 NOTE — Progress Notes (Signed)
Kristy Cooper , [birth year], 50 y.o., female MRN: [id number] Patient Care Team    Relationship Specialty Notifications Start End  [name], DO PCP - General Family Medicine  11/02/15     CC: facial twitching Subjective: Pt presents for an acute OV with complaints of "facial twitching" of approximately 2 and half weeks duration.  Patient reports the left side of her cheek near her mouth started twitching uncontrollably about 2. 5-3 weeks ago. The twitching has worsened to be constant on the left side of her mouth and now intermittently in her bilateral eyes (see right greater than left). She reports the twitching in her eyes occurs approximately every 20-30 minutes and last about 5 seconds. The left-sided facial twitch is constant. She states this is very embarrassing. She has never experienced anything like this before. She does endorse posterior left ear pain the week prior to the onset of the twitching. She endorses ringing in her left ear, has resolved. She does endorse increasing frequency of headaches and mild nausea. She denies fever, chills, abdominal pain, rash, erythema, weakness, dizziness, visual changes, light sensitivity or hearing loss. She has had no recent medication changes. She does admit to increased in anxiety. She states her mother was recently moved into an assisted living facility and it has caused her a great deal stress.  Results for orders placed or performed in visit on 02/28/16 (from the past 24 hour(s))  CBC w/Diff     Status: Abnormal   Collection Time: 02/28/16  2:26 PM  Result Value Ref Range   WBC 9.0 3.8 - 10.8 K/uL   RBC 5.00 3.80 - 5.10 MIL/uL   Hemoglobin 13.1 11.7 - 15.5 g/dL   HCT 40.1 35.0 - 45.0 %   MCV 80.2 80.0 - 100.0 fL   MCH 26.2 (L) 27.0 - 33.0 pg   MCHC 32.7 32.0 - 36.0 g/dL   RDW 15.3 (H) 11.0 - 15.0 %   Platelets 330 140 - 400 K/uL   MPV 9.0 7.5 - 12.5 fL   Neutro Abs 7,020 1,500 - 7,800 cells/uL   Lymphs Abs 1,350 850 - 3,900  cells/uL   Monocytes Absolute 450 200 - 950 cells/uL   Eosinophils Absolute 180 15 - 500 cells/uL   Basophils Absolute 0 0 - 200 cells/uL   Neutrophils Relative % 78 %   Lymphocytes Relative 15 %   Monocytes Relative 5 %   Eosinophils Relative 2 %   Basophils Relative 0 %   Smear Review Criteria for review not met    Narrative   Performed at:  [address], [address]                [place], [place] [address]  Sed Rate (ESR)     Status: Abnormal   Collection Time: 02/28/16  2:26 PM  Result Value Ref Range   Sed Rate 32 (H) 0 - 20 mm/hr   Narrative   Performed at:  [address], [address]                [place],  [address]  Comp Met (CMET)     Status: Abnormal   Collection Time: 02/28/16  2:26 PM  Result Value Ref Range   Sodium 137 135 - 145 mEq/L  Potassium 4.1 3.5 - 5.1 mEq/L   Chloride 103 96 - 112 mEq/L   CO2 25 19 - 32 mEq/L   Glucose, Bld 204 (H) 70 - 99 mg/dL   BUN 15 6 - 23 mg/dL   Creatinine, Ser 0.47 0.40 - 1.20 mg/dL   Total Bilirubin 0.3 0.2 - 1.2 mg/dL   Alkaline Phosphatase 76 39 - 117 U/L   AST 15 0 - 37 U/L   ALT 9 0 - 35 U/L   Total Protein 7.5 6.0 - 8.3 g/dL   Albumin 4.5 3.5 - 5.2 g/dL   Calcium 9.4 8.4 - 92.9 mg/dL   GFR 19.69 >68.77 mL/min     No Known Allergies Social History  Substance Use Topics  . Smoking status: Former Games developer  . Smokeless tobacco: Never Used  . Alcohol use No   Past Medical History:  Diagnosis Date  . Bilateral foot pain    chronic- told arthritis and was prescribed tramadol.   . Colon polyp 2014   tubular adenoma  . Depression   . Depression with anxiety   . GERD (gastroesophageal reflux disease)   . Obesity   . Prediabetes   . Right rotator cuff tendinitis 2013   Past Surgical History:  Procedure Laterality Date  . CESAREAN SECTION     Family History  Problem Relation Age of Onset  . Breast cancer Mother   . Diabetes Mother     . Mental illness Mother   . Heart disease Mother   . Arthritis Mother   . Arthritis Father   . Mental illness Father   . Breast cancer Maternal Aunt   . Diabetes Maternal Aunt   . Arthritis Maternal Aunt   . Colon cancer Maternal Uncle   . Diabetes Maternal Uncle   . Colon cancer Paternal Aunt   . Diabetes Paternal Aunt   . Heart disease Paternal Aunt      Medication List    as of 02/28/2016  2:01 PM   You have not been prescribed any medications.     No results found for this or any previous visit (from the past 24 hour(s)). No results found.   ROS: Negative, with the exception of above mentioned in HPI   Objective:  BP 129/82 (BP Location: Right Arm, Patient Position: Sitting, Cuff Size: Large)   Pulse 83   Temp 98 F (36.7 C)   Resp 20   Wt 189 lb 12 oz (86.1 kg)   SpO2 98%   BMI 30.63 kg/m  Body mass index is 30.63 kg/m. Gen: Afebrile. No acute distress. Nontoxic in appearance, well developed, well nourished. Very pleasant female. HENT: AT. . No facial swelling. Constant left nasolabial fold facial twitch near mouth appreciated. Intermittent right eye twitching appreciated. Bilateral TM visualized without erythema, bulging or mass. No tenderness to palpation external ear or mastoid. MMM, no oral lesions. Bilateral nares without erythema or swelling. Throat without erythema or exudates. No cough, no hoarseness, no tenderness to palpation. Eyes:Pupils Equal Round Reactive to light, Extraocular movements intact,  Conjunctiva without redness, discharge or icterus. Neck/lymp/endocrine: Supple, no lymphadenopathy CV: RRR Chest: CTAB, no wheeze or crackles.  MSK: No erythema, no soft tissue swelling cervical spine. Full range of motion. Skin: No rashes, purpura or petechiae.  Neuro: Normal gait. PERLA. EOMi. Alert. Oriented x3 Cranial nerves II through XII intact. Uvula midline. Bilateral facial Sensation intact. Muscle strength 5/5 bilateral upper and lower  extremity. DTRs equal bilaterally. Psych: Normal affect, dress and  demeanor. Normal speech. Normal thought content and judgment.  Assessment/Plan: Kristy Cooper is a 50 y.o. female present for acute OV for  Facial twitching/oral mandibular dystonia/elevated sedimentation rate/elevated glucose - Discussed with patient and her husband differential diagnosis includes infection versus lesion affecting facial nerves. Also consider increased anxiety as a potential cause although less likely. CBC, sedimentation rate, ANA, CMET obtained.  - ANA is still pending.  - Patient was found to have an elevated glucose above 200, she had been diagnosed as prediabetic a few months prior. - ESR elevated 32. The available results with patient today patient was started on baclofen to help with the tension/spasms. She is also to start the prednisone taper and Valtrex. - CBC w/Diff - Sed Rate (ESR) - ANA, IFA Comprehensive Panel - Comp Met (CMET) - MRI with contrast brain/face ordered ASAP. Given the worsening, now constant facial twitching/oromandibular dystonia present for 3 weeks, that was superseded by left posterior ear pain now with an elevated sedimentation rate, it is appropriate to complete a prompt imaging study of her brain/face to investigate nerve irritation from tumor/lesion. - Patient to follow-up one to 2 days after MRI is completed, sooner if worsening. Ideally would like MRI completed ASAP and patient to be seen within a week of initial visit.  electronically signed by:  Howard Pouch, DO  [place]

## 2016-02-28 NOTE — Patient Instructions (Signed)
Start baclofen tonight. Take every 8 hours if able.  I will call you with results with further plan.

## 2016-02-29 ENCOUNTER — Telehealth: Payer: Self-pay | Admitting: Family Medicine

## 2016-02-29 DIAGNOSIS — R700 Elevated erythrocyte sedimentation rate: Secondary | ICD-10-CM | POA: Insufficient documentation

## 2016-02-29 DIAGNOSIS — R7309 Other abnormal glucose: Secondary | ICD-10-CM | POA: Insufficient documentation

## 2016-02-29 DIAGNOSIS — G244 Idiopathic orofacial dystonia: Secondary | ICD-10-CM | POA: Insufficient documentation

## 2016-02-29 DIAGNOSIS — G514 Facial myokymia: Secondary | ICD-10-CM

## 2016-02-29 LAB — ANA, IFA COMPREHENSIVE PANEL
ANA: NEGATIVE
ENA SM Ab Ser-aCnc: <1
SCLERODERMA (SCL-70) (ENA) ANTIBODY, IGG: NEGATIVE
SM/RNP: <1
SSA (Ro) (ENA) Antibody, IgG: <1
SSB (LA) (ENA) ANTIBODY, IGG: NEGATIVE

## 2016-02-29 LAB — SEDIMENTATION RATE: SED RATE: 32 mm/h — AB (ref 0–20)

## 2016-02-29 MED ORDER — PREDNISONE 20 MG PO TABS: ORAL_TABLET | ORAL | 0 refills | Status: DC

## 2016-02-29 MED ORDER — VALACYCLOVIR HCL 1 G PO TABS: 1000.0000 mg | ORAL_TABLET | Freq: Two times a day (BID) | ORAL | 0 refills | Status: DC

## 2016-02-29 NOTE — Telephone Encounter (Signed)
-  pt labs are normal with exception of elevated glucose > 200 and elevated ESR. She is started on steroid taper and antiviral.  - discussed considering  anti-anxiety medication if she felt she needed.  - MRI of brain/temporal/face warranted to rule out lesion/tumor with worsening symptoms and duration of symptoms. Ordered placed today for facial dystonia, worsening over 3 weeks period with elevated sed rate.  - F/U 1-2 days after MRI, sooner if worsening.

## 2016-03-01 ENCOUNTER — Telehealth: Payer: Self-pay | Admitting: *Deleted

## 2016-03-01 DIAGNOSIS — G514 Facial myokymia: Secondary | ICD-10-CM

## 2016-03-01 DIAGNOSIS — R700 Elevated erythrocyte sedimentation rate: Secondary | ICD-10-CM

## 2016-03-01 DIAGNOSIS — G244 Idiopathic orofacial dystonia: Secondary | ICD-10-CM

## 2016-03-01 NOTE — Telephone Encounter (Signed)
Received call from [facility] imaging they state MRI orders must be entered as with and without contrast. Re-entered orders.

## 2016-03-02 ENCOUNTER — Telehealth: Payer: Self-pay | Admitting: Family Medicine

## 2016-03-02 ENCOUNTER — Other Ambulatory Visit: Payer: Self-pay | Admitting: Plastic Surgery

## 2016-03-02 NOTE — Telephone Encounter (Signed)
MRI brain/head approved.

## 2016-03-02 NOTE — Telephone Encounter (Signed)
Christella Scheuermann called regarding a prior authorization for patient. The MRI of the neck was denied. To go over the denial or do a peer to peer they can be reached at the number below and use the reference number provided.   [employer] phone: [phone]  Reference # [id number]

## 2016-03-06 ENCOUNTER — Ambulatory Visit
Admission: RE | Admit: 2016-03-06 | Discharge: 2016-03-06 | Disposition: A | Discharge status: Home / Self Care | Payer: Managed Care, Other (non HMO) | Source: Ambulatory Visit | Attending: Family Medicine | Admitting: Family Medicine

## 2016-03-06 DIAGNOSIS — G514 Facial myokymia: Secondary | ICD-10-CM

## 2016-03-06 DIAGNOSIS — G244 Idiopathic orofacial dystonia: Secondary | ICD-10-CM

## 2016-03-06 DIAGNOSIS — R700 Elevated erythrocyte sedimentation rate: Secondary | ICD-10-CM

## 2016-03-06 MED ORDER — GADOBENATE DIMEGLUMINE 529 MG/ML IV SOLN
17.0000 mL | Freq: Once | INTRAVENOUS | Status: AC | PRN
  Administered 2016-03-06: 17 mL via INTRAVENOUS

## 2016-03-07 ENCOUNTER — Telehealth: Payer: Self-pay | Admitting: Family Medicine

## 2016-03-07 NOTE — Telephone Encounter (Signed)
Spoke with patient scheduled follow up appt. 

## 2016-03-07 NOTE — Telephone Encounter (Signed)
Please make sure pt has an appt scheduled for this week for f/u. She had her MRI yesterday and I also need to see how she is responding to medicines.  Thank you.

## 2016-03-12 ENCOUNTER — Encounter: Payer: Self-pay | Admitting: Family Medicine

## 2016-03-12 ENCOUNTER — Ambulatory Visit (INDEPENDENT_AMBULATORY_CARE_PROVIDER_SITE_OTHER): Payer: Managed Care, Other (non HMO) | Admitting: Family Medicine

## 2016-03-12 VITALS — BP 139/81 | HR 100 | Temp 97.9°F | Resp 20 | Ht 66.0 in | Wt 186.0 lb

## 2016-03-12 DIAGNOSIS — R700 Elevated erythrocyte sedimentation rate: Secondary | ICD-10-CM | POA: Diagnosis not present

## 2016-03-12 DIAGNOSIS — G244 Idiopathic orofacial dystonia: Secondary | ICD-10-CM

## 2016-03-12 DIAGNOSIS — R7303 Prediabetes: Secondary | ICD-10-CM

## 2016-03-12 DIAGNOSIS — G514 Facial myokymia: Secondary | ICD-10-CM | POA: Diagnosis not present

## 2016-03-12 DIAGNOSIS — F4323 Adjustment disorder with mixed anxiety and depressed mood: Secondary | ICD-10-CM

## 2016-03-12 LAB — POCT GLYCOSYLATED HEMOGLOBIN (HGB A1C): HEMOGLOBIN A1C: 6

## 2016-03-12 MED ORDER — SERTRALINE HCL 50 MG PO TABS: ORAL_TABLET | ORAL | 0 refills | Status: DC

## 2016-03-12 MED ORDER — PREDNISONE 20 MG PO TABS: ORAL_TABLET | ORAL | 0 refills | Status: DC

## 2016-03-12 NOTE — Patient Instructions (Signed)
Start zoloft 50 mg qd for 7 days then increase to 100 mg QD, followup in 4 weeks.   Prednisone taper prescribed again. Hopefully this helps decrease the facial twitch until you see Neurology.   Prediabetes: continue diet and exercise > 150 minutes a week. Consider the program I gave you for guidance on diet and exercise.  F/U 3 months    Diabetes Mellitus and Food It is important for you to manage your blood sugar (glucose) level. Your blood glucose level can be greatly affected by what you eat. Eating healthier foods in the appropriate amounts throughout the day at about the same time each day will help you control your blood glucose level. It can also help slow or prevent worsening of your diabetes mellitus. Healthy eating may even help you improve the level of your blood pressure and reach or maintain a healthy weight. General recommendations for healthful eating and cooking habits include:  Eating meals and snacks regularly. Avoid going long periods of time without eating to lose weight.  Eating a diet that consists mainly of plant-based foods, such as fruits, vegetables, nuts, legumes, and whole grains.  Using low-heat cooking methods, such as baking, instead of high-heat cooking methods, such as deep frying. Work with your dietitian to make sure you understand how to use the Nutrition Facts information on food labels. How can food affect me? Carbohydrates  Carbohydrates affect your blood glucose level more than any other type of food. Your dietitian will help you determine how many carbohydrates to eat at each meal and teach you how to count carbohydrates. Counting carbohydrates is important to keep your blood glucose at a healthy level, especially if you are using insulin or taking certain medicines for diabetes mellitus. Alcohol  Alcohol can cause sudden decreases in blood glucose (hypoglycemia), especially if you use insulin or take certain medicines for diabetes mellitus. Hypoglycemia  can be a life-threatening condition. Symptoms of hypoglycemia (sleepiness, dizziness, and disorientation) are similar to symptoms of having too much alcohol. If your health care provider has given you approval to drink alcohol, do so in moderation and use the following guidelines:  Women should not have more than one drink per day, and men should not have more than two drinks per day. One drink is equal to:  12 oz of beer.  5 oz of wine.  1 oz of hard liquor.  Do not drink on an empty stomach.  Keep yourself hydrated. Have water, diet soda, or unsweetened iced tea.  Regular soda, juice, and other mixers might contain a lot of carbohydrates and should be counted. What foods are not recommended? As you make food choices, it is important to remember that all foods are not the same. Some foods have fewer nutrients per serving than other foods, even though they might have the same number of calories or carbohydrates. It is difficult to get your body what it needs when you eat foods with fewer nutrients. Examples of foods that you should avoid that are high in calories and carbohydrates but low in nutrients include:  Trans fats (most processed foods list trans fats on the Nutrition Facts label).  Regular soda.  Juice.  Candy.  Sweets, such as cake, pie, doughnuts, and cookies.  Fried foods. What foods can I eat? Eat nutrient-rich foods, which will nourish your body and keep you healthy. The food you should eat also will depend on several factors, including:  The calories you need.  The medicines you take.  Your  weight.  Your blood glucose level.  Your blood pressure level.  Your cholesterol level. You should eat a variety of foods, including:  Protein.  Lean cuts of meat.  Proteins low in saturated fats, such as fish, egg whites, and beans. Avoid processed meats.  Fruits and vegetables.  Fruits and vegetables that may help control blood glucose levels, such as apples,  mangoes, and yams.  Dairy products.  Choose fat-free or low-fat dairy products, such as milk, yogurt, and cheese.  Grains, bread, pasta, and rice.  Choose whole grain products, such as multigrain bread, whole oats, and brown rice. These foods may help control blood pressure.  Fats.  Foods containing healthful fats, such as nuts, avocado, olive oil, canola oil, and fish. Does everyone with diabetes mellitus have the same meal plan? Because every person with diabetes mellitus is different, there is not one meal plan that works for everyone. It is very important that you meet with a dietitian who will help you create a meal plan that is just right for you. This information is not intended to replace advice given to you by your health care provider. Make sure you discuss any questions you have with your health care provider. Document Released: 12/14/2004 Document Revised: 08/25/2015 Document Reviewed: 02/13/2013 Elsevier Interactive Patient Education  2017 [employer].

## 2016-03-12 NOTE — Progress Notes (Signed)
Kristy Cooper , [birth year], 50 y.o., female MRN: [id number] Patient Care Team    Relationship Specialty Notifications Start End  [name], DO PCP - General Family Medicine  11/02/15     CC: facial twitching followup Subjective:   Elevated sedimentation rate/Facial twitching/Oromandibular dystonia Patient states the twitching did go away for a few days during the use of prednisone and valtrex, but returned last night. The twitching of her left nasolabial fold is again constant. She endorses intermittent left upper eyelid twitching returned as well.  Reviewed her lab and imaging studies with pt, elevated ESR and glucose. The MRI of her brain was normal. An MRI of her face was ordered, however her insurance would not pay for that study. Pt denies exposure to ticks, fever, chills, confusion or headache.   Prediabetes: pt had an elevated a1c 3 months ago 5.7. Her glucose on random labs was 204 after eating cereal prior to appt. Pt states she has been watching her diet and eating lower starchy and sugar content since the 204 glucose. She has checked her sugars at home and reports they are elevated, however she has been on prednisone as well.    Adjustment disorder with mixed anxiety and depressed mood Pt has been on zoloft 100 mg QD by prior provider a few years ago. She reports when she lost her insurance she had to abruptly come of medication and did not like the way the discontinuation process made her feel. She tolerated the zolfot use well, and reports feeling better in the medicine. She has had increase stress lately surrounding the care of her mother and now her facial twitching, which has caused her great anxiety. She would like to restart the medicine.    Prior note:  Pt presents for an acute OV with complaints of "facial twitching" of approximately 2 and half weeks duration.  Patient reports the left side of her cheek near her mouth started twitching uncontrollably about 2. 5-3  weeks ago. The twitching has worsened to be constant on the left side of her mouth and now intermittently in her bilateral eyes (see right greater than left). She reports the twitching in her eyes occurs approximately every 20-30 minutes and last about 5 seconds. The left-sided facial twitch is constant. She states this is very embarrassing. She has never experienced anything like this before. She does endorse posterior left ear pain the week prior to the onset of the twitching. She endorses ringing in her left ear, has resolved. She does endorse increasing frequency of headaches and mild nausea. She denies fever, chills, abdominal pain, rash, erythema, weakness, dizziness, visual changes, light sensitivity or hearing loss. She has had no recent medication changes. She does admit to increased in anxiety. She states her mother was recently moved into an assisted living facility and it has caused her a great deal stress.  Recent Results (from the past 2160 hour(s))  CBC w/Diff     Status: Abnormal   Collection Time: 02/28/16  2:26 PM  Result Value Ref Range   WBC 9.0 3.8 - 10.8 K/uL   RBC 5.00 3.80 - 5.10 MIL/uL   Hemoglobin 13.1 11.7 - 15.5 g/dL   HCT 40.1 35.0 - 45.0 %   MCV 80.2 80.0 - 100.0 fL   MCH 26.2 (L) 27.0 - 33.0 pg   MCHC 32.7 32.0 - 36.0 g/dL   RDW 15.3 (H) 11.0 - 15.0 %   Platelets 330 140 - 400 K/uL   MPV 9.0  7.5 - 12.5 fL   Neutro Abs 7,020 1,500 - 7,800 cells/uL   Lymphs Abs 1,350 850 - 3,900 cells/uL   Monocytes Absolute 450 200 - 950 cells/uL   Eosinophils Absolute 180 15 - 500 cells/uL   Basophils Absolute 0 0 - 200 cells/uL   Neutrophils Relative % 78 %   Lymphocytes Relative 15 %   Monocytes Relative 5 %   Eosinophils Relative 2 %   Basophils Relative 0 %   Smear Review Criteria for review not met   Sed Rate (ESR)     Status: Abnormal   Collection Time: 02/28/16  2:26 PM  Result Value Ref Range   Sed Rate 32 (H) 0 - 20 mm/hr  Comp Met (CMET)     Status: Abnormal    Collection Time: 02/28/16  2:26 PM  Result Value Ref Range   Sodium 137 135 - 145 mEq/L   Potassium 4.1 3.5 - 5.1 mEq/L   Chloride 103 96 - 112 mEq/L   CO2 25 19 - 32 mEq/L   Glucose, Bld 204 (H) 70 - 99 mg/dL   BUN 15 6 - 23 mg/dL   Creatinine, Ser 0.72 0.40 - 1.20 mg/dL   Total Bilirubin 0.3 0.2 - 1.2 mg/dL   Alkaline Phosphatase 76 39 - 117 U/L   AST 15 0 - 37 U/L   ALT 9 0 - 35 U/L   Total Protein 7.5 6.0 - 8.3 g/dL   Albumin 4.5 3.5 - 5.2 g/dL   Calcium 9.4 8.4 - 10.5 mg/dL   GFR 91.03 >60.00 mL/min  ANA, IFA Comprehensive Panel     Status: None   Collection Time: 02/28/16  2:27 PM  Result Value Ref Range   Anit Nuclear Antibody(ANA) NEG NEGATIVE   ds DNA Ab <1 IU/mL    Comment:                                 IU/mL       Interpretation                               < or = 4    Negative                               5-9         Indeterminate                               > or = 10   Positive      Scleroderma (Scl-70) (ENA) Antibody, IgG <1.0 NEG <1.0 NEG AI   SSA (Ro) (ENA) Antibody, IgG <1.0 NEG <1.0 NEG AI   SSB (La) (ENA) Antibody, IgG <1.0 NEG <1.0 NEG AI   ENA SM Ab Ser-aCnc <1.0 NEG <1.0 NEG AI   SM/RNP <1.0 NEG <1.0 NEG AI     Mr [name] Contrast  Result Date: 03/07/2016 CLINICAL DATA:  [place] 5 months ago, striking back of head. I twitching, facial twitching, headache, extremity tingling for 1 month. 5 minute episode of blurry vision. EXAM: MRI HEAD WITHOUT AND WITH CONTRAST TECHNIQUE: Multiplanar, multiecho pulse sequences of the brain and surrounding structures were obtained without and with intravenous contrast. CONTRAST:  16m MULTIHANCE GADOBENATE DIMEGLUMINE 529 MG/ML IV  SOLN COMPARISON:  None. FINDINGS: BRAIN: No reduced diffusion to suggest acute ischemia. No susceptibility artifact to suggest hemorrhage. The ventricles and sulci are normal for patient's age. No suspicious parenchymal signal, masses or mass effect. No abnormal extra-axial fluid  collections. No extra-axial masses though, contrast enhanced sequences would be more sensitive. VASCULAR: Normal major intracranial vascular flow voids present at skull base. SKULL AND UPPER CERVICAL SPINE: No abnormal sellar expansion. No suspicious calvarial bone marrow signal. Craniocervical junction maintained. SINUSES/ORBITS: The mastoid air-cells and included paranasal sinuses are well-aerated. The included ocular globes and orbital contents are non-suspicious. OTHER: None. IMPRESSION: Normal MRI head with and without contrast. Electronically Signed   By: [name]   On: 03/07/2016 05:53   No results found for this or any previous visit (from the past 24 hour(s)).   No Known Allergies Social History  Substance Use Topics  . Smoking status: Former Research scientist (life sciences)  . Smokeless tobacco: Never Used  . Alcohol use No   Past Medical History:  Diagnosis Date  . Bilateral foot pain    chronic- told arthritis and was prescribed tramadol.   . Colon polyp 2014   tubular adenoma  . Depression   . Depression with anxiety   . GERD (gastroesophageal reflux disease)   . Obesity   . Prediabetes   . Right rotator cuff tendinitis 2013   Past Surgical History:  Procedure Laterality Date  . CESAREAN SECTION     Family History  Problem Relation Age of Onset  . Breast cancer Mother   . Diabetes Mother   . Mental illness Mother   . Heart disease Mother   . Arthritis Mother   . Arthritis Father   . Mental illness Father   . Breast cancer Maternal Aunt   . Diabetes Maternal Aunt   . Arthritis Maternal Aunt   . Colon cancer Maternal Uncle   . Diabetes Maternal Uncle   . Colon cancer Paternal Aunt   . Diabetes Paternal Aunt   . Heart disease Paternal Aunt      Medication List    as of 03/12/2016 11:08 AM   You have not been prescribed any medications.     No results found for this or any previous visit (from the past 24 hour(s)). No results found.   ROS: Negative, with the  exception of above mentioned in HPI   Objective:  BP 139/81 (BP Location: Right Arm, Patient Position: Sitting, Cuff Size: Large)   Pulse 100   Temp 97.9 F (36.6 C)   Resp 20   Ht _0  (1.676 m)   Wt 186 lb (84.4 kg)   SpO2 98%   BMI 30.02 kg/m  Body mass index is 30.02 kg/m.  Gen: Afebrile. No acute distress. Very pleasant female.  HENT: AT. [place]. Bilateral TM visualized and normal in appearance. No TTP ear or mastoid, parotid/facial nerve. MMM, no lesions.  Bilateral nares WNL. Throat without erythema or exudates. No facial swelling. Constant left nasolabial fold twitching, most notable within dimple.  Eyes:Pupils Equal Round Reactive to light, Extraocular movements intact,  Conjunctiva without redness, discharge or icterus. Neck/lymp/endocrine: Supple,no lymphadenopathy Skin: no rashes, purpura or petechiae.  Neuro: Normal gait. PERLA. EOMi. Alert. Oriented. Cranial nerves II through XII intact. Uvula midline, bilateral facial sensation intact. Symmetrical facies.  Psych: Normal affect, dress and demeanor. Normal speech. Normal thought content and judgment.     Assessment/Plan: Cynia Abruzzo is a 50 y.o. female present for followup OV for  Elevated  sedimentation rate Facial twitching Oromandibular dystonia - possibly responded to prednisone, however restarted last night and has been constant again.  - MRI brain was normal, unfortunately her insurance did not cover the MRI face.  - discussed labs and imaging results today, pt agreed to neurology referral.  - predniSONE (DELTASONE) 20 MG tablet; 60 mg x3d, 40 mg x3d, 20 mg x2d, 10 mg x2d  Dispense: 18 tablet; Refill: 0 - Ambulatory referral to Neurology  Prediabetes - dicussed diet, exercise. PREP program information provided to pt.  - A1c today increased from 5.7 to 6.0. Pt has a glucose of 204 after eating a sugary cereal.  - POCT glycosylated hemoglobin (Hb A1C) - discussed the prednisone taper will cause some  elevation in her sugars short term.  - F/U 3 months, if elevated will need to consider metformin use.    Adjustment disorder with mixed anxiety and depressed mood - restart zoloft, pt had been on prior and had tolerated medication. She feels her anxiety has been increased and would like to restart medication.  - sertraline (ZOLOFT) 50 MG tablet; 50 mg QD for 7 days then increase to 100 mg QD  Dispense: 49 tablet; Refill: 0  electronically signed by:  Howard Pouch, DO  [place]

## 2016-03-13 ENCOUNTER — Telehealth: Payer: Self-pay | Admitting: Family Medicine

## 2016-03-13 NOTE — Telephone Encounter (Signed)
[facility]  Patient Name: Kristy Cooper  DOB: 1965-05-17    Initial Comment Caller States they have pain on their side, nausea, chills and loss of appetite    Nurse Assessment  Nurse: Harlow Mares, RN, Suanne Marker Date/Time (Eastern Time): 03/13/2016 4:00:11 PM  Confirm and document reason for call. If symptomatic, describe symptoms. ---Caller States they have pain on their side, nausea, chills and loss of appetite. Pain is intermittent on her right side of her abd, "just above my ovary". Denies fever. Pain began yesterday.  Does the patient have any new or worsening symptoms? ---Yes  Will a triage be completed? ---Yes  Related visit to physician within the last 2 weeks? ---No  Does the PT have any chronic conditions? (i.e. diabetes, asthma, etc.) ---Yes  List chronic conditions. ---facial twitching (taking prednisone); borderline diabetic; depression  Is the patient pregnant or possibly pregnant? (Ask all females between the ages of 42-55) ---No  Is this a behavioral health or substance abuse call? ---No     Guidelines    Guideline Title Affirmed Question Affirmed Notes  Abdominal Pain - Female [1] MODERATE (e.g., interferes with normal activities) AND [2] pain comes and goes (cramps) AND [3] present > 24 hours (Exception: pain with Vomiting or Diarrhea - see that Guideline)    Final Disposition User   See Physician within [place], RN, Suanne Marker    Comments  Caller scheduled to see Dr. Raoul Pitch at the [facility] office tomorrow 03/14/16 at 8:45am. Caller voiced understanding.   Referrals  REFERRED TO PCP OFFICE   Disagree/Comply: Comply

## 2016-03-14 ENCOUNTER — Telehealth: Payer: Self-pay | Admitting: Family Medicine

## 2016-03-14 ENCOUNTER — Encounter: Payer: Self-pay | Admitting: Family Medicine

## 2016-03-14 ENCOUNTER — Ambulatory Visit (INDEPENDENT_AMBULATORY_CARE_PROVIDER_SITE_OTHER): Payer: Managed Care, Other (non HMO) | Admitting: Family Medicine

## 2016-03-14 ENCOUNTER — Ambulatory Visit (HOSPITAL_BASED_OUTPATIENT_CLINIC_OR_DEPARTMENT_OTHER)
Admission: RE | Admit: 2016-03-14 | Discharge: 2016-03-14 | Disposition: A | Discharge status: Home / Self Care | Payer: Managed Care, Other (non HMO) | Source: Ambulatory Visit | Attending: Family Medicine | Admitting: Family Medicine

## 2016-03-14 VITALS — BP 128/84 | HR 77 | Temp 97.7°F | Resp 20 | Wt 183.2 lb

## 2016-03-14 DIAGNOSIS — Z23 Encounter for immunization: Secondary | ICD-10-CM

## 2016-03-14 DIAGNOSIS — N926 Irregular menstruation, unspecified: Secondary | ICD-10-CM

## 2016-03-14 DIAGNOSIS — R1030 Lower abdominal pain, unspecified: Secondary | ICD-10-CM | POA: Diagnosis present

## 2016-03-14 DIAGNOSIS — Z860100 Personal history of colon polyps, unspecified: Secondary | ICD-10-CM

## 2016-03-14 DIAGNOSIS — R319 Hematuria, unspecified: Secondary | ICD-10-CM | POA: Diagnosis not present

## 2016-03-14 LAB — FOLLICLE STIMULATING HORMONE: FSH: 7 m[IU]/mL

## 2016-03-14 LAB — URINALYSIS, ROUTINE W REFLEX MICROSCOPIC
BILIRUBIN URINE: NEGATIVE
Ketones, ur: NEGATIVE
Leukocytes, UA: NEGATIVE
NITRITE: NEGATIVE
Specific Gravity, Urine: 1.015 (ref 1.000–1.030)
TOTAL PROTEIN, URINE-UPE24: NEGATIVE
URINE GLUCOSE: NEGATIVE
Urobilinogen, UA: 0.2 (ref 0.0–1.0)
pH: 6 (ref 5.0–8.0)

## 2016-03-14 LAB — POC URINALSYSI DIPSTICK (AUTOMATED)
BILIRUBIN UA: NEGATIVE
GLUCOSE UA: NEGATIVE
KETONES UA: NEGATIVE
Leukocytes, UA: NEGATIVE
Nitrite, UA: NEGATIVE
Protein, UA: NEGATIVE
SPEC GRAV UA: 1.015
UROBILINOGEN UA: 0.2
pH, UA: 5.5

## 2016-03-14 LAB — CBC WITH DIFFERENTIAL/PLATELET
BASOS PCT: 0.4 % (ref 0.0–3.0)
Basophils Absolute: 0.1 10*3/uL (ref 0.0–0.1)
EOS PCT: 0.5 % (ref 0.0–5.0)
Eosinophils Absolute: 0.1 10*3/uL (ref 0.0–0.7)
HEMATOCRIT: 42.6 % (ref 36.0–46.0)
HEMOGLOBIN: 14 g/dL (ref 12.0–15.0)
LYMPHS PCT: 20 % (ref 12.0–46.0)
Lymphs Abs: 3.3 10*3/uL (ref 0.7–4.0)
MCHC: 32.9 g/dL (ref 30.0–36.0)
MCV: 82.4 fl (ref 78.0–100.0)
MONOS PCT: 5.3 % (ref 3.0–12.0)
Monocytes Absolute: 0.9 10*3/uL (ref 0.1–1.0)
Neutro Abs: 12.2 10*3/uL — ABNORMAL HIGH (ref 1.4–7.7)
Neutrophils Relative %: 73.8 % (ref 43.0–77.0)
Platelets: 412 10*3/uL — ABNORMAL HIGH (ref 150.0–400.0)
RBC: 5.16 Mil/uL — AB (ref 3.87–5.11)
RDW: 15.6 % — AB (ref 11.5–15.5)
WBC: 16.5 10*3/uL — AB (ref 4.0–10.5)

## 2016-03-14 LAB — POCT URINE PREGNANCY: Preg Test, Ur: NEGATIVE

## 2016-03-14 LAB — C-REACTIVE PROTEIN: CRP: 0.4 mg/dL — AB (ref 0.5–20.0)

## 2016-03-14 LAB — LUTEINIZING HORMONE: LH: 7.41 m[IU]/mL

## 2016-03-14 LAB — TSH: TSH: 1.73 u[IU]/mL (ref 0.35–4.50)

## 2016-03-14 NOTE — Progress Notes (Signed)
Kristy Cooper , 1965-05-25, 49 y.o., female MRN: [id number] Patient Care Team    Relationship Specialty Notifications Start End  [name], DO PCP - General Family Medicine  11/02/15     CC: Right lower quadrant pain Subjective: Pt presents for an acute OV with complaints of right lower quadrant pain of 2 days duration.  Associated symptoms include nausea, chills, loss of appetite and constipation. She denies fever, diarrhea or dysuria. She describes the pain as a burning pain, with intermittent sharpness that is happening multiple times throughout the day. She reports laying on her side and eating will make the pain worse. The pain is improved with lying on her back. Patient states she's had more frequent issues with constipation over the last few months. Her last bowel movement was yesterday, and was hard. She was unable to have a bowel movement today. She denies any blood in her stool. Her menstrual cycles have become irregular over the last few months, lasting only a few days, and skipping cycles. Her last menstrual period was December 5-8 th, and she skipped her cycle in November. She denies any prior history of ovarian cyst. She has a strong family history of colon cancer, and has had early colon cancer screening with polypectomy.She has not taken anything for the pain.  No Known Allergies Social History  Substance Use Topics  . Smoking status: Former Research scientist (life sciences)  . Smokeless tobacco: Never Used  . Alcohol use No   Past Medical History:  Diagnosis Date  . Bilateral foot pain    chronic- told arthritis and was prescribed tramadol.   . Colon polyp 2014   tubular adenoma  . Depression   . Depression with anxiety   . GERD (gastroesophageal reflux disease)   . Obesity   . Prediabetes   . Right rotator cuff tendinitis 2013   Past Surgical History:  Procedure Laterality Date  . CESAREAN SECTION     Family History  Problem Relation Age of Onset  . Breast cancer Mother   .  Diabetes Mother   . Mental illness Mother   . Heart disease Mother   . Arthritis Mother   . Arthritis Father   . Mental illness Father   . Breast cancer Maternal Aunt   . Diabetes Maternal Aunt   . Arthritis Maternal Aunt   . Colon cancer Maternal Uncle   . Diabetes Maternal Uncle   . Colon cancer Paternal Aunt   . Diabetes Paternal Aunt   . Heart disease Paternal Aunt      Medication List       Accurate as of 03/14/16  9:08 AM. Always use your most recent med list.          predniSONE 20 MG tablet Commonly known as:  DELTASONE 60 mg x3d, 40 mg x3d, 20 mg x2d, 10 mg x2d   sertraline 50 MG tablet Commonly known as:  ZOLOFT 50 mg QD for 7 days then increase to 100 mg QD       No results found for this or any previous visit (from the past 24 hour(s)). No results found.   ROS: Negative, with the exception of above mentioned in HPI   Objective:  BP 128/84 (BP Location: Right Arm, Patient Position: Sitting, Cuff Size: Large)   Pulse 77   Temp 97.7 F (36.5 C)   Resp 20   Wt 183 lb 4 oz (83.1 kg)   SpO2 97%   BMI 29.58 kg/m  Body mass  index is 29.58 kg/m. Gen: Afebrile. No acute distress. Nontoxic in appearance, well developed, well nourished. Pleasant female. HENT: AT. [place].  MMM Eyes:Pupils Equal Round Reactive to light, Extraocular movements intact,  Conjunctiva without redness, discharge or icterus. CV: RRR  Chest: CTAB, no wheeze or crackles. Good air movement, normal resp effort.  Abd: Soft. Obese. ND. Tender to palpation left lower quadrant, right lower quadrant and suprapubic. BS hyperactive. ? Increased stool burden versus enlarged uterus near umbilicus. No rebound or guarding. Negative Murphy's. Negative McBurney's. Negative psoas. No peritoneal signs/negative heel test. MSK: No CVA tenderness Skin: No rashes, purpura or petechiae. No skin changes of abdomen. Neuro: Normal gait. PERLA. EOMi. Alert. Oriented x3   Results for orders placed or performed  in visit on 03/14/16 (from the past 24 hour(s))  POCT Urinalysis Dipstick (Automated)     Status: Abnormal   Collection Time: 03/14/16  9:32 AM  Result Value Ref Range   Color, UA yellow    Clarity, UA clear    Glucose, UA negative    Bilirubin, UA negative    Ketones, UA negative    Spec Grav, UA 1.015    Blood, UA small    pH, UA 5.5    Protein, UA negative    Urobilinogen, UA 0.2    Nitrite, UA negative    Leukocytes, UA Negative Negative  POCT urine pregnancy     Status: Normal   Collection Time: 03/14/16  9:41 AM  Result Value Ref Range   Preg Test, Ur Negative Negative   Dg Abd 2 Views  Result Date: 03/14/2016 CLINICAL DATA:  Right lower quadrant pain for several days EXAM: ABDOMEN - 2 VIEW COMPARISON:  07/21/2014 FINDINGS: Scattered large and small bowel gas is noted. Mild fecal material is noted within the colon. No obstructive changes are seen. No free air is noted. Degenerative changes of the lumbar spine are seen. No other bony abnormality is noted. IMPRESSION: No acute abnormality seen. Electronically Signed   By: Inez Catalina M.D.   On: 03/14/2016 10:48     Assessment/Plan: Kristy Cooper is a 50 y.o. female present for acute OV for  Lower abdominal pain Hematuria, unspecified type Irregular menstrual cycle - At this time unknown etiology of patient's lower abdominal pain. Discussed the possibility of constipation/irritable bowel syndrome. Patient states that she's always had a hyperactive sounding abdomen, especially after she eats.  - DDX: Constipation, ovarian cyst, ileus - collect labs today to evaluate irregular menses and abdominal pain. X-ray ordered to rule out constipation/ - POCT Urinalysis Dipstick (Automated): small/trace blood - POCT urine pregnancy: Negative - TSH - Estradiol - FSH - LH - C-reactive protein - CBC w/Diff - Urinalysis, Routine w reflex microscopic - DG Abd 2 Views; Future - Patient encouraged to use Miralax Daily in 8 ounces  of water. Consider probiotic. - Depending upon labs in patient condition on follow-up, will consider abdominal/pelvis ultrasound versus CT.  - If symptoms not improved with in one week patient is to follow-up, sooner if worsening. Urged her to call in if pain is worsening and will order imaging with follow-up to occur after. - If symptoms resolve with use of stool softenener and  bowel movements, no need to follow-up.   Need for prophylactic vaccination and inoculation against influenza - Flu Vaccine QUAD 36+ mos IM  electronically signed by:  Howard Pouch, DO  [place]

## 2016-03-14 NOTE — Telephone Encounter (Signed)
Please call patient: - Her x-ray did show some mild stool burden/constipation and moderate bowel gas. - I would encourage her to start Miralax 1 cap in 8 ounces of water today and continue until bowel movements are soft, but not diarrhea. She then can taper the amount of miralax back, with the goal to keep bowels moving regularly.  - Once I receive lab results we will discuss plan.

## 2016-03-14 NOTE — Patient Instructions (Signed)
I have ordered labs for you and an xray of your abdomen.  I will call you with all results once available, we will discuss further management.  Try over the counter pain relief for now, would like to avoid narcotic if able.   Start a stool softener daily (miralax, dulcolax)

## 2016-03-14 NOTE — Telephone Encounter (Signed)
Spoke with patient reviewed results and instructions . Patient verbalized understanding. 

## 2016-03-15 ENCOUNTER — Telehealth: Payer: Self-pay | Admitting: Family Medicine

## 2016-03-15 LAB — ESTRADIOL: Estradiol: 154 pg/mL

## 2016-03-15 MED ORDER — CIPROFLOXACIN HCL 500 MG PO TABS: 500.0000 mg | ORAL_TABLET | Freq: Two times a day (BID) | ORAL | 0 refills | Status: DC

## 2016-03-15 NOTE — Telephone Encounter (Signed)
Spoke with patient reviewed results and instructions with patient. Patient verbalized understanding. 

## 2016-03-15 NOTE — Telephone Encounter (Signed)
Please call pt: Her labs look normal with the following exceptions:  - She has an elevated WBC count. This is seen with infection, but could also be from steroid use (which she is in her second round for facial twitching).  - her female hormones and thyroid do not indicate reason for menstrual changes.  - her urine appeared infectious on send out exam. I have sent out a script for 3 days of abx. If her pain is not improving or worsening, I will want to see her within a week.

## 2016-04-03 ENCOUNTER — Other Ambulatory Visit: Payer: Self-pay | Admitting: Family Medicine

## 2016-04-03 ENCOUNTER — Telehealth: Payer: Self-pay | Admitting: Family Medicine

## 2016-04-03 DIAGNOSIS — F4323 Adjustment disorder with mixed anxiety and depressed mood: Secondary | ICD-10-CM

## 2016-04-03 MED ORDER — SERTRALINE HCL 100 MG PO TABS: 100.0000 mg | ORAL_TABLET | Freq: Every day | ORAL | 1 refills | Status: DC

## 2016-04-03 NOTE — Telephone Encounter (Signed)
Please call pt: - I have refilled her zoloft. The new pill is the 100 mg tab, so just take 1 tab now. I have called in enough for 6 months. She will need f/u prior to running out of meds in 6 months

## 2016-04-04 NOTE — Telephone Encounter (Signed)
Patient notified and verbalized understanding. 

## 2016-04-24 ENCOUNTER — Ambulatory Visit: Payer: Managed Care, Other (non HMO) | Admitting: Neurology

## 2016-07-30 ENCOUNTER — Ambulatory Visit (INDEPENDENT_AMBULATORY_CARE_PROVIDER_SITE_OTHER): Payer: Managed Care, Other (non HMO) | Admitting: Family Medicine

## 2016-07-30 ENCOUNTER — Encounter: Payer: Self-pay | Admitting: Family Medicine

## 2016-07-30 ENCOUNTER — Ambulatory Visit (HOSPITAL_BASED_OUTPATIENT_CLINIC_OR_DEPARTMENT_OTHER)
Admission: RE | Admit: 2016-07-30 | Discharge: 2016-07-30 | Disposition: A | Discharge status: Home / Self Care | Payer: Managed Care, Other (non HMO) | Source: Ambulatory Visit | Attending: Family Medicine | Admitting: Family Medicine

## 2016-07-30 ENCOUNTER — Telehealth: Payer: Self-pay | Admitting: Family Medicine

## 2016-07-30 VITALS — BP 111/72 | HR 83 | Temp 98.6°F | Resp 20 | Ht 66.0 in | Wt 191.0 lb

## 2016-07-30 DIAGNOSIS — M5136 Other intervertebral disc degeneration, lumbar region: Secondary | ICD-10-CM | POA: Insufficient documentation

## 2016-07-30 DIAGNOSIS — M5431 Sciatica, right side: Secondary | ICD-10-CM | POA: Insufficient documentation

## 2016-07-30 DIAGNOSIS — F4323 Adjustment disorder with mixed anxiety and depressed mood: Secondary | ICD-10-CM

## 2016-07-30 MED ORDER — SERTRALINE HCL 100 MG PO TABS: 100.0000 mg | ORAL_TABLET | Freq: Every day | ORAL | 1 refills | Status: DC

## 2016-07-30 MED ORDER — MELOXICAM 15 MG PO TABS: 15.0000 mg | ORAL_TABLET | Freq: Every day | ORAL | 1 refills | Status: DC

## 2016-07-30 NOTE — Progress Notes (Signed)
Kristy Cooper , 06/17/1965, 51 y.o., female MRN: [id number] Patient Care Team    Relationship Specialty Notifications Start End  [name], DO PCP - General Family Medicine  11/02/15     Chief Complaint  Patient presents with  . Sciatica    right with numbness in foot x 2 months  . Depression    Subjective:  Adjustment disorder with mixed anxiety and depressed mood Patient presents for follow-up on anxiety and depression today. She reports she is doing very well on the Zoloft 100 mg daily. She denies any notable side effects. She feels she is stable on this medication. She would like refills today. Prior note: Pt has been on zoloft 100 mg QD by prior provider a few years ago. She reports when she lost her insurance she had to abruptly come of medication and did not like the way the discontinuation process made her feel. She tolerated the zolfot use well, and reports feeling better in the medicine. She has had increase stress lately surrounding the care of her mother and now her facial twitching, which has caused her great anxiety. She would like to restart the medicine.    Depression screen [facility] 2/9 07/30/2016 11/23/2015 11/02/2015  Decreased Interest 0 0 0  Down, Depressed, Hopeless 0 0 0  PHQ - 2 Score 0 0 0    Low back pain: This is a new problem for patient. She states she has had right-sided low back pain for about one month. There was no increase in activity, lifting or known injury acutely. She states she did have an injury when she was a young child, which did not need any medical intervention, but nothing recent. However over the last month she started with right sided lower back pain, that radiated into her buttock and now is radiating down to her right foot. She reports tingling sensation intermittently in her right foot. She denies any weakness. She denies any bladder or bowel changes.   No results found for this or any previous visit (from the past 2160  hour(s)).   Mr [name] Contrast  Result Date: 03/07/2016 CLINICAL DATA:  [place] 5 months ago, striking back of head. I twitching, facial twitching, headache, extremity tingling for 1 month. 5 minute episode of blurry vision. EXAM: MRI HEAD WITHOUT AND WITH CONTRAST TECHNIQUE: Multiplanar, multiecho pulse sequences of the brain and surrounding structures were obtained without and with intravenous contrast. CONTRAST:  50mL MULTIHANCE GADOBENATE DIMEGLUMINE 529 MG/ML IV SOLN COMPARISON:  None. FINDINGS: BRAIN: No reduced diffusion to suggest acute ischemia. No susceptibility artifact to suggest hemorrhage. The ventricles and sulci are normal for patient's age. No suspicious parenchymal signal, masses or mass effect. No abnormal extra-axial fluid collections. No extra-axial masses though, contrast enhanced sequences would be more sensitive. VASCULAR: Normal major intracranial vascular flow voids present at skull base. SKULL AND UPPER CERVICAL SPINE: No abnormal sellar expansion. No suspicious calvarial bone marrow signal. Craniocervical junction maintained. SINUSES/ORBITS: The mastoid air-cells and included paranasal sinuses are well-aerated. The included ocular globes and orbital contents are non-suspicious. OTHER: None. IMPRESSION: Normal MRI head with and without contrast. Electronically Signed   By: [name]   On: 03/07/2016 05:53   No results found for this or any previous visit (from the past 24 hour(s)).   No Known Allergies Social History  Substance Use Topics  . Smoking status: Former Research scientist (life sciences)  . Smokeless tobacco: Never Used  . Alcohol use No   Past Medical  History:  Diagnosis Date  . Bilateral foot pain    chronic- told arthritis and was prescribed tramadol.   . Colon polyp 2014   tubular adenoma  . Depression   . Depression with anxiety   . GERD (gastroesophageal reflux disease)   . Obesity   . Prediabetes   . Right rotator cuff tendinitis 2013   Past Surgical  History:  Procedure Laterality Date  . CESAREAN SECTION     Family History  Problem Relation Age of Onset  . Breast cancer Mother   . Diabetes Mother   . Mental illness Mother   . Heart disease Mother   . Arthritis Mother   . Arthritis Father   . Mental illness Father   . Breast cancer Maternal Aunt   . Diabetes Maternal Aunt   . Arthritis Maternal Aunt   . Colon cancer Maternal Uncle   . Diabetes Maternal Uncle   . Colon cancer Paternal Aunt   . Diabetes Paternal Aunt   . Heart disease Paternal Aunt    Allergies as of 07/30/2016   No Known Allergies     Medication List       Accurate as of 07/30/16  2:54 PM. Always use your most recent med list.          sertraline 100 MG tablet Commonly known as:  ZOLOFT Take 1 tablet (100 mg total) by mouth daily.       No results found for this or any previous visit (from the past 24 hour(s)). No results found.   ROS: Negative, with the exception of above mentioned in HPI   Objective:  BP 111/72 (BP Location: Left Arm, Patient Position: Sitting, Cuff Size: Large)   Pulse 83   Temp 98.6 F (37 C)   Resp 20   Ht 5\' 6"  (1.676 m)   Wt 191 lb (86.6 kg)   SpO2 98%   BMI 30.83 kg/m  Body mass index is 30.83 kg/m.  Gen: Afebrile. No acute distress. Very pleasant Caucasian female. HENT: AT. [place].  MMM.  Eyes:Pupils Equal Round Reactive to light, Extraocular movements intact,  Conjunctiva without redness, discharge or icterus. CV: RRR, no edema, +2/4 P posterior tibialis pulses Chest: CTAB, no wheeze or crackles Abd: Soft. NTND. BS present.  MSK: No erythema, no soft tissue swelling. No bone tenderness over lumbar spine. Tender to palpation right> left SI joint. Tender to palpation over piriformis. Normal range of motion lumbar spine, with mild discomfort on left side bending. Muscle strength 5/5 bilateral lower extremity. Negative FabRE, negative straight leg raise. DTR equal bilaterally. Normal sensation. NV intact  distally. Skin: No rashes, purpura or petechiae.  Neuro:  Normal gait. PERLA. EOMi. Alert. Oriented x3 Psych: Normal affect, dress and demeanor. Normal speech. Normal thought content and judgment.  Assessment/Plan: Kristy Cooper is a 51 y.o. female present for followup OV for  Adjustment disorder with mixed anxiety and depressed mood -Stable. - Continue Zoloft 100 mg daily. - Follow-up in 6 months, sooner if needed.  Lower lumbar pain/sciatica right: - New problem. - We'll obtain lumbar x-ray today given greater than 4 weeks into discomfort. Patient also had a abdomen x-ray a few months ago that showed degenerative changes in the spine. - Discussed different types of treatments today, patient would like to avoid steroid use. - Mobic 15 mg daily with meals. - Sciatic stretches - Heating pad - Follow-up in 3-4 weeks if not improving, sooner if worsening.  electronically signed by:  Howard Pouch,  DO  [facility] [place]

## 2016-07-30 NOTE — Telephone Encounter (Signed)
Please call pt:  - her lumbar xray did show evidence of arthritic changes and bone spurring. Bone spurring can cause problems with the nerve if it is pressing against it. Would suggest taking th mobic daily with food as discussed. If symptoms are not improved in 3-4 weeks followup and we can refer her to specialist vs further imaging. If worsening she is to be seen sooner.

## 2016-07-30 NOTE — Patient Instructions (Signed)
Sciatica Rehab Ask your health care provider which exercises are safe for you. Do exercises exactly as told by your health care provider and adjust them as directed. It is normal to feel mild stretching, pulling, tightness, or discomfort as you do these exercises, but you should stop right away if you feel sudden pain or your pain gets worse.Do not begin these exercises until told by your health care provider. Stretching and range of motion exercises These exercises warm up your muscles and joints and improve the movement and flexibility of your hips and your back. These exercises also help to relieve pain, numbness, and tingling. Exercise A: Sciatic nerve glide  1. Sit in a chair with your head facing down toward your chest. Place your hands behind your back. Let your shoulders slump forward. 2. Slowly straighten one of your knees while you tilt your head back as if you are looking toward the ceiling. Only straighten your leg as far as you can without making your symptoms worse. 3. Hold for __________ seconds. 4. Slowly return to the starting position. 5. Repeat with your other leg. Repeat __________ times. Complete this exercise __________ times a day. Exercise B: Knee to chest with hip adduction and internal rotation    1. Lie on your back on a firm surface with both legs straight. 2. Bend one of your knees and move it up toward your chest until you feel a gentle stretch in your lower back and buttock. Then, move your knee toward the shoulder that is on the opposite side from your leg. ? Hold your leg in this position by holding onto the front of your knee. 3. Hold for __________ seconds. 4. Slowly return to the starting position. 5. Repeat with your other leg. Repeat __________ times. Complete this exercise __________ times a day. Exercise C: Prone extension on elbows    1. Lie on your abdomen on a firm surface. A bed may be too soft for this exercise. 2. Prop yourself up on your  elbows. 3. Use your arms to help lift your chest up until you feel a gentle stretch in your abdomen and your lower back. ? This will place some of your body weight on your elbows. If this is uncomfortable, try stacking pillows under your chest. ? Your hips should stay down, against the surface that you are lying on. Keep your hip and back muscles relaxed. 4. Hold for __________ seconds. 5. Slowly relax your upper body and return to the starting position. Repeat __________ times. Complete this exercise __________ times a day. Strengthening exercises These exercises build strength and endurance in your back. Endurance is the ability to use your muscles for a long time, even after they get tired. Exercise D: Pelvic tilt  1. Lie on your back on a firm surface. Bend your knees and keep your feet flat. 2. Tense your abdominal muscles. Tip your pelvis up toward the ceiling and flatten your lower back into the floor. ? To help with this exercise, you may place a small towel under your lower back and try to push your back into the towel. 3. Hold for __________ seconds. 4. Let your muscles relax completely before you repeat this exercise. Repeat __________ times. Complete this exercise __________ times a day. Exercise E: Alternating arm and leg raises    1. Get on your hands and knees on a firm surface. If you are on a hard floor, you may want to use padding to cushion your knees, such as   an exercise mat. 2. Line up your arms and legs. Your hands should be below your shoulders, and your knees should be below your hips. 3. Lift your left leg behind you. At the same time, raise your right arm and straighten it in front of you. ? Do not lift your leg higher than your hip. ? Do not lift your arm higher than your shoulder. ? Keep your abdominal and back muscles tight. ? Keep your hips facing the ground. ? Do not arch your back. ? Keep your balance carefully, and do not hold your breath. 4. Hold for  __________ seconds. 5. Slowly return to the starting position and repeat with your right leg and your left arm. Repeat __________ times. Complete this exercise __________ times a day. Posture and body mechanics    Body mechanics refers to the movements and positions of your body while you do your daily activities. Posture is part of body mechanics. Good posture and healthy body mechanics can help to relieve stress in your body's tissues and joints. Good posture means that your spine is in its natural S-curve position (your spine is neutral), your shoulders are pulled back slightly, and your head is not tipped forward. The following are general guidelines for applying improved posture and body mechanics to your everyday activities. Standing     When standing, keep your spine neutral and your feet about hip-width apart. Keep a slight bend in your knees. Your ears, shoulders, and hips should line up.  When you do a task in which you stand in one place for a long time, place one foot up on a stable object that is 2-4 inches (5-10 cm) high, such as a footstool. This helps keep your spine neutral. Sitting     When sitting, keep your spine neutral and keep your feet flat on the floor. Use a footrest, if necessary, and keep your thighs parallel to the floor. Avoid rounding your shoulders, and avoid tilting your head forward.  When working at a desk or a computer, keep your desk at a height where your hands are slightly lower than your elbows. Slide your chair under your desk so you are close enough to maintain good posture.  When working at a computer, place your monitor at a height where you are looking straight ahead and you do not have to tilt your head forward or downward to look at the screen. Resting     When lying down and resting, avoid positions that are most painful for you.  If you have pain with activities such as sitting, bending, stooping, or squatting (flexion-based  activities), lie in a position in which your body does not bend very much. For example, avoid curling up on your side with your arms and knees near your chest (fetal position).  If you have pain with activities such as standing for a long time or reaching with your arms (extension-based activities), lie with your spine in a neutral position and bend your knees slightly. Try the following positions: ? Lying on your side with a pillow between your knees. ? Lying on your back with a pillow under your knees. Lifting     When lifting objects, keep your feet at least shoulder-width apart and tighten your abdominal muscles.  Bend your knees and hips and keep your spine neutral. It is important to lift using the strength of your legs, not your back. Do not lock your knees straight out.  Always ask for help to lift heavy   or awkward objects. This information is not intended to replace advice given to you by your health care provider. Make sure you discuss any questions you have with your health care provider. Document Released: 03/19/2005 Document Revised: 11/24/2015 Document Reviewed: 12/03/2014 Elsevier Interactive Patient Education  2017 [employer]          

## 2016-07-31 NOTE — Telephone Encounter (Signed)
Spoke with patient reviewed results and instructions . Patient verbalized understanding. 

## 2016-08-28 ENCOUNTER — Telehealth: Payer: Self-pay | Admitting: *Deleted

## 2016-08-28 NOTE — Telephone Encounter (Signed)
Patient called and left message stating the meloxicam she was Rx'd on 07/30/16 is not working and she was requesting something lse be called in. After review of office visit note from 07/30/16 Dr Raoul Pitch had advised patient at that time to follow up in office in 3-4 weeks if no improvement . Called patient and left message with this information and instructions for patient to call and schedule an office visit.

## 2016-09-03 ENCOUNTER — Encounter: Payer: Self-pay | Admitting: Family Medicine

## 2016-09-03 ENCOUNTER — Ambulatory Visit (INDEPENDENT_AMBULATORY_CARE_PROVIDER_SITE_OTHER): Payer: Managed Care, Other (non HMO) | Admitting: Family Medicine

## 2016-09-03 VITALS — BP 118/76 | HR 82 | Temp 97.8°F | Resp 20 | Wt 191.5 lb

## 2016-09-03 DIAGNOSIS — M5136 Other intervertebral disc degeneration, lumbar region: Secondary | ICD-10-CM | POA: Insufficient documentation

## 2016-09-03 DIAGNOSIS — M5416 Radiculopathy, lumbar region: Secondary | ICD-10-CM | POA: Diagnosis not present

## 2016-09-03 DIAGNOSIS — M51369 Other intervertebral disc degeneration, lumbar region without mention of lumbar back pain or lower extremity pain: Secondary | ICD-10-CM | POA: Insufficient documentation

## 2016-09-03 MED ORDER — DICLOFENAC SODIUM 75 MG PO TBEC: 75.0000 mg | DELAYED_RELEASE_TABLET | Freq: Two times a day (BID) | ORAL | 0 refills | Status: DC

## 2016-09-03 MED ORDER — GABAPENTIN 100 MG PO CAPS: 100.0000 mg | ORAL_CAPSULE | Freq: Three times a day (TID) | ORAL | 0 refills | Status: DC

## 2016-09-03 NOTE — Progress Notes (Signed)
Kristy Cooper , 10/09/1965, 51 y.o., female MRN: [id number] Patient Care Team    Relationship Specialty Notifications Start End  [name], DO PCP - General Family Medicine  11/02/15     Chief Complaint  Patient presents with  . Sciatica    Subjective:  Low back pain:  Pt presents for follow up on low back pain with radiation to her right lower extremity which started about 8 weeks ago without known injury. Please see prior note below for initial encounter specifics. She has been taking the mobic daily, but had noticed swelling of her eyes/lips that she contributed to being allergic to their new rabbit, but since running out of mobic the symptoms subsided. She felt the mobic gave her mild improvement but did not resolve pain. She still endorses pain that radiates to her left lateral calf with activity only, such as walking. She states the pain is ok as long as she is not moving. Walking distance creates discomfort and she has to stop and rest/stretch.  Prior note 07/30/2016: This is a new problem for patient. She states she has had right-sided low back pain for about one month. There was no increase in activity, lifting or known injury acutely. She states she did have an injury when she was a young child, which did not need any medical intervention, but nothing recent. However over the last month she started with right sided lower back pain, that radiated into her buttock and now is radiating down to her right foot. She reports tingling sensation intermittently in her right foot. She denies any weakness. She denies any bladder or bowel changes.   No results found for this or any previous visit (from the past 2160 hour(s)).  CLINICAL DATA:  Lower back pain radiating to RIGHT side with numbness down RIGHT leg to foot for 3 weeks, RIGHT side sciatica, noticed pain after pulling hard on a vacuum cleaner  EXAM: LUMBAR SPINE - COMPLETE 4+ VIEW COMPARISON:  None FINDINGS: Five  non-rib-bearing lumbar vertebra. Minimal levoconvex scoliosis. Bones appear demineralized. Vertebral body and disc space heights maintained though scattered endplate spurs are noted particularly at L3-L4 and L4-L5. No acute fracture, subluxation, or bone destruction. No spondylolysis. SI joints symmetric. IMPRESSION: Degenerative disc disease changes lumbar spine. No acute abnormalities. Mr Kristy Cooper Contrast  Result Date: 03/07/2016 CLINICAL DATA:  [place] 5 months ago, striking back of head. I twitching, facial twitching, headache, extremity tingling for 1 month. 5 minute episode of blurry vision. EXAM: MRI HEAD WITHOUT AND WITH CONTRAST TECHNIQUE: Multiplanar, multiecho pulse sequences of the brain and surrounding structures were obtained without and with intravenous contrast. CONTRAST:  40mL MULTIHANCE GADOBENATE DIMEGLUMINE 529 MG/ML IV SOLN COMPARISON:  None. FINDINGS: BRAIN: No reduced diffusion to suggest acute ischemia. No susceptibility artifact to suggest hemorrhage. The ventricles and sulci are normal for patient's age. No suspicious parenchymal signal, masses or mass effect. No abnormal extra-axial fluid collections. No extra-axial masses though, contrast enhanced sequences would be more sensitive. VASCULAR: Normal major intracranial vascular flow voids present at skull base. SKULL AND UPPER CERVICAL SPINE: No abnormal sellar expansion. No suspicious calvarial bone marrow signal. Craniocervical junction maintained. SINUSES/ORBITS: The mastoid air-cells and included paranasal sinuses are well-aerated. The included ocular globes and orbital contents are non-suspicious. OTHER: None. IMPRESSION: Normal MRI head with and without contrast. Electronically Signed   By: Kristy Cooper M.D.   On: 03/07/2016 05:53   No results found for this or any previous visit (from  the past 24 hour(s)).   Allergies  Allergen Reactions  . Meloxicam Swelling   Social History  Substance Use Topics  .  Smoking status: Former Research scientist (life sciences)  . Smokeless tobacco: Never Used  . Alcohol use No   Past Medical History:  Diagnosis Date  . Bilateral foot pain    chronic- told arthritis and was prescribed tramadol.   . Colon polyp 2014   tubular adenoma  . Depression   . Depression with anxiety   . GERD (gastroesophageal reflux disease)   . Obesity   . Prediabetes   . Right rotator cuff tendinitis 2013   Past Surgical History:  Procedure Laterality Date  . CESAREAN SECTION     Family History  Problem Relation Age of Onset  . Breast cancer Mother   . Diabetes Mother   . Mental illness Mother   . Heart disease Mother   . Arthritis Mother   . Arthritis Father   . Mental illness Father   . Breast cancer Maternal Aunt   . Diabetes Maternal Aunt   . Arthritis Maternal Aunt   . Colon cancer Maternal Uncle   . Diabetes Maternal Uncle   . Colon cancer Paternal Aunt   . Diabetes Paternal Aunt   . Heart disease Paternal Aunt    Allergies as of 09/03/2016      Reactions   Meloxicam Swelling      Medication List       Accurate as of 09/03/16  8:34 AM. Always use your most recent med list.          sertraline 100 MG tablet Commonly known as:  ZOLOFT Take 1 tablet (100 mg total) by mouth daily.       No results found for this or any previous visit (from the past 24 hour(s)). No results found.   ROS: Negative, with the exception of above mentioned in HPI   Objective:  BP 118/76 (BP Location: Left Arm, Patient Position: Sitting, Cuff Size: Large)   Pulse 82   Temp 97.8 F (36.6 C)   Resp 20   Wt 191 lb 8 oz (86.9 kg)   SpO2 97%   BMI 30.91 kg/m  Body mass index is 30.91 kg/m.  Gen: Afebrile. No acute distress.  HENT: AT. [place]. MMM.  Eyes:Pupils Equal Round Reactive to light, Extraocular movements intact,  Conjunctiva without redness, discharge or icterus. MSK: no erythema, no step off of lumbar spine. No bone tenderness lumbar spine. TTP Right SI. Normal ROM lumbar spine.  Neg FABRE. Neg SLR. NV intact Skin: no rashes, purpura or petechiae. WWP, intact Neuro: Normal gait. PERLA. EOMi. Alert. Oriented x3.  Muscle strength 5/5 bilateral Lower extremity. NV intact distally.    Assessment/Plan: Aniesha Haughn is a 51 y.o. female present for followup OV for  Lumbar radiculopathy - unresolved. Reaction to mobic with mild swelling of eyes/lips (but still took for a month), but has been able to tolerate aleve/advil/motrin products. - Reviewed xrays with her and her husband today. Rather significant spurring of lumbar spine likely causing pain, now greater than 8 weeks. Pt would still like to avoid steroid use. They would also like to avoid MRI if possible.  - Trail of Voltaren BID, of course stop if reaction or go to ED if SOB etc. Pt willing to try. Could consider tramadol if needed.  - Start gabapentin taper, explained to pt today.  - gabapentin (NEURONTIN) 100 MG capsule; Take 1 capsule (100 mg total) by mouth 3 (three)  times daily.  Dispense: 90 capsule; Refill: 0 - diclofenac (VOLTAREN) 75 MG EC tablet; Take 1 tablet (75 mg total) by mouth 2 (two) times daily.  Dispense: 60 tablet; Refill: 0 - referral to neuro. - F/U 4 weeks if not seen by neuro in that time which would take over management.   electronically signed by:  Howard Pouch, DO  [place]

## 2016-09-03 NOTE — Patient Instructions (Addendum)
Taper gabapentin 100 mg at night for 3 days, then increase to 100 mg every 8 hours for a week. Then increase to 300 mg at night, and 100-200 the other two doses in the day depending on sedation.  Stop at doses that pain is better and sedation is not a factor.  No more than 300 mg every 8 hours before being seen again for follow up.   Voltaren every 12 hours with food.   I will refer you to specialist, if they want an MRI prior we will need to get one, sometimes they do-sometimes they do not.

## 2016-09-28 ENCOUNTER — Other Ambulatory Visit: Payer: Self-pay | Admitting: Family Medicine

## 2016-09-28 DIAGNOSIS — M5416 Radiculopathy, lumbar region: Secondary | ICD-10-CM

## 2016-10-25 ENCOUNTER — Other Ambulatory Visit: Payer: Self-pay | Admitting: Family Medicine

## 2016-10-25 DIAGNOSIS — M5416 Radiculopathy, lumbar region: Secondary | ICD-10-CM

## 2016-11-20 ENCOUNTER — Telehealth: Payer: Self-pay | Admitting: *Deleted

## 2016-11-20 ENCOUNTER — Encounter: Payer: Self-pay | Admitting: *Deleted

## 2016-11-20 NOTE — Telephone Encounter (Signed)
Received refill request for gabapentin and sertraline patient need office visit prior to anymore refills on these medications. Message sent in My Chart to patient.

## 2016-11-22 ENCOUNTER — Ambulatory Visit (INDEPENDENT_AMBULATORY_CARE_PROVIDER_SITE_OTHER): Payer: Managed Care, Other (non HMO) | Admitting: Family Medicine

## 2016-11-22 ENCOUNTER — Encounter: Payer: Self-pay | Admitting: Family Medicine

## 2016-11-22 DIAGNOSIS — F4323 Adjustment disorder with mixed anxiety and depressed mood: Secondary | ICD-10-CM

## 2016-11-22 DIAGNOSIS — M5416 Radiculopathy, lumbar region: Secondary | ICD-10-CM | POA: Diagnosis not present

## 2016-11-22 MED ORDER — GABAPENTIN 100 MG PO CAPS: 200.0000 mg | ORAL_CAPSULE | Freq: Three times a day (TID) | ORAL | 1 refills | Status: DC

## 2016-11-22 MED ORDER — SERTRALINE HCL 100 MG PO TABS: 100.0000 mg | ORAL_TABLET | Freq: Every day | ORAL | 1 refills | Status: DC

## 2016-11-22 NOTE — Progress Notes (Signed)
Kristy Cooper , 07-Dec-1965, 51 y.o., female MRN: [id number] Patient Care Team    Relationship Specialty Notifications Start End  [name], DO PCP - General Family Medicine  11/02/15     Chief Complaint  Patient presents with  . Anxiety  . Back Pain    Subjective:  Adjustment disorder with mixed anxiety and depressed mood Patient presents for follow-up on anxiety and depression today. She is doing rather well on zoloft 100 mg qd. She would like refills today.  Low back pain:  The gabapentin is working for her at 200-300 mg TID. She is not feeling over tired, but does notice at the 300 mg dose she is more sleepy. She does have times she is in more discomfort, but overall improved. She did not schedule a neurology appt, referral was placed. She states it was too expensive and the pain had improved.  Prior note: Pt presents for follow up on low back pain with radiation to her right lower extremity which started about 8 weeks ago without known injury. Please see prior note below for initial encounter specifics. She has been taking the mobic daily, but had noticed swelling of her eyes/lips that she contributed to being allergic to their new rabbit, but since running out of mobic the symptoms subsided. She felt the mobic gave her mild improvement but did not resolve pain. She still endorses pain that radiates to her left lateral calf with activity only, such as walking. She states the pain is ok as long as she is not moving. Walking distance creates discomfort and she has to stop and rest/stretch.  Prior note 07/30/2016: This is a new problem for patient. She states she has had right-sided low back pain for about one month. There was no increase in activity, lifting or known injury acutely. She states she did have an injury when she was a young child, which did not need any medical intervention, but nothing recent. However over the last month she started with right sided lower back pain,  that radiated into her buttock and now is radiating down to her right foot. She reports tingling sensation intermittently in her right foot. She denies any weakness. She denies any bladder or bowel changes.   No results found for this or any previous visit (from the past 2160 hour(s)).  CLINICAL DATA:  Lower back pain radiating to RIGHT side with numbness down RIGHT leg to foot for 3 weeks, RIGHT side sciatica, noticed pain after pulling hard on a vacuum cleaner  EXAM: LUMBAR SPINE - COMPLETE 4+ VIEW COMPARISON:  None FINDINGS: Five non-rib-bearing lumbar vertebra. Minimal levoconvex scoliosis. Bones appear demineralized. Vertebral body and disc space heights maintained though scattered endplate spurs are noted particularly at L3-L4 and L4-L5. No acute fracture, subluxation, or bone destruction. No spondylolysis. SI joints symmetric. IMPRESSION: Degenerative disc disease changes lumbar spine. No acute abnormalities. Mr [name] Contrast  Result Date: 03/07/2016 CLINICAL DATA:  [place] 5 months ago, striking back of head. I twitching, facial twitching, headache, extremity tingling for 1 month. 5 minute episode of blurry vision. EXAM: MRI HEAD WITHOUT AND WITH CONTRAST TECHNIQUE: Multiplanar, multiecho pulse sequences of the brain and surrounding structures were obtained without and with intravenous contrast. CONTRAST:  14mL MULTIHANCE GADOBENATE DIMEGLUMINE 529 MG/ML IV SOLN COMPARISON:  None. FINDINGS: BRAIN: No reduced diffusion to suggest acute ischemia. No susceptibility artifact to suggest hemorrhage. The ventricles and sulci are normal for patient's age. No suspicious parenchymal signal, masses or mass  effect. No abnormal extra-axial fluid collections. No extra-axial masses though, contrast enhanced sequences would be more sensitive. VASCULAR: Normal major intracranial vascular flow voids present at skull base. SKULL AND UPPER CERVICAL SPINE: No abnormal sellar expansion. No suspicious  calvarial bone marrow signal. Craniocervical junction maintained. SINUSES/ORBITS: The mastoid air-cells and included paranasal sinuses are well-aerated. The included ocular globes and orbital contents are non-suspicious. OTHER: None. IMPRESSION: Normal MRI head with and without contrast. Electronically Signed   By: [name]   On: 03/07/2016 05:53   No results found for this or any previous visit (from the past 24 hour(s)).   Allergies  Allergen Reactions  . Meloxicam Swelling  . Voltaren [Diclofenac] Swelling   Social History  Substance Use Topics  . Smoking status: Former Research scientist (life sciences)  . Smokeless tobacco: Never Used  . Alcohol use No   Past Medical History:  Diagnosis Date  . Bilateral foot pain    chronic- told arthritis and was prescribed tramadol.   . Colon polyp 2014   tubular adenoma  . Depression   . Depression with anxiety   . GERD (gastroesophageal reflux disease)   . Obesity   . Prediabetes   . Right rotator cuff tendinitis 2013   Past Surgical History:  Procedure Laterality Date  . CESAREAN SECTION     Family History  Problem Relation Age of Onset  . Breast cancer Mother   . Diabetes Mother   . Mental illness Mother   . Heart disease Mother   . Arthritis Mother   . Arthritis Father   . Mental illness Father   . Breast cancer Maternal Aunt   . Diabetes Maternal Aunt   . Arthritis Maternal Aunt   . Colon cancer Maternal Uncle   . Diabetes Maternal Uncle   . Colon cancer Paternal Aunt   . Diabetes Paternal Aunt   . Heart disease Paternal Aunt    Allergies as of 11/22/2016      Reactions   Meloxicam Swelling   Voltaren [diclofenac] Swelling      Medication List       Accurate as of 11/22/16  8:27 AM. Always use your most recent med list.          alclomethasone 0.05 % cream Commonly known as:  ACLOVATE   gabapentin 100 MG capsule Commonly known as:  NEURONTIN TAKE ONE CAPSULE BY MOUTH THREE TIMES DAILY   sertraline 100 MG  tablet Commonly known as:  ZOLOFT Take 1 tablet (100 mg total) by mouth daily.       No results found for this or any previous visit (from the past 24 hour(s)). No results found.   ROS: Negative, with the exception of above mentioned in HPI   Objective:  BP 113/80 (BP Location: Left Arm, Patient Position: Sitting, Cuff Size: Large)   Pulse 72   Temp 98 F (36.7 C)   Resp 20   Ht 5\' 6"  (1.676 m)   Wt 195 lb 8 oz (88.7 kg)   SpO2 97%   BMI 31.55 kg/m  Body mass index is 31.55 kg/m.  Gen: Afebrile. No acute distress.  HENT: AT. [place]. . MMM.  Eyes:Pupils Equal Round Reactive to light, Extraocular movements intact,  Conjunctiva without redness, discharge or icterus. CV: RRR Chest: CTAB, no wheeze or crackles Neuro: Normal gait. PERLA. EOMi. Alert. Oriented x3  Psych: Normal affect, dress and demeanor. Normal speech. Normal thought content and judgment..    Assessment/Plan: Teresha Hanks is a 51 y.o.  female present for followup OV for  Lumbar radiculopathy - Improved, now chronic. Pt counseled on chronicity of this type of pain and flares.  - She is allergic to mobic and voltaran. She was counseled on NSAID allergy. - Refills on gabapentin 300 mg TID, discuss daily TID use is best. 200-300 mg. - gabapentin (NEURONTIN) 100 MG capsule; Take 1 capsule (100 mg total) by mouth 3 (three) times daily.  Dispense: 90 capsule; Refill: 0 - F/U 6 mos.   Adjustment disorder with mixed anxiety and depressed mood - Stable.  - Continue Zoloft 100 mg daily. - Follow-up in 6 months, sooner if needed. electronically signed by:  Howard Pouch, DO  [place]

## 2016-11-22 NOTE — Patient Instructions (Addendum)
It was great to see you today.  I am glad you are doing well.   Follow up in 6 months on issues.   Yearly physical.   Please help [place] help you:  We are honored you have chosen [facility] for your Primary Care home. Below you will find basic instructions that you may need to access in the future. Please help [place] help you by reading the instructions, which cover many of the frequent questions we experience.   Prescription refills and request:  -In order to allow more efficient response time, please call your pharmacy for all refills. They will forward the request electronically to [place]. This allows for the quickest possible response. Request left on a nurse line can take longer to refill, since these are checked as time allows between office patients and other phone calls.  - refill request can take up to 3-5 working days to complete.  - If request is sent electronically and request is appropiate, it is usually completed in 1-2 business days.  - all patients will need to be seen routinely for all chronic medical conditions requiring prescription medications (see follow-up below). If you are overdue for follow up on your condition, you will be asked to make an appointment and we will call in enough medication to cover you until your appointment (up to 30 days).  - all controlled substances will require a face to face visit to request/refill.  - if you desire your prescriptions to go through a new pharmacy, and have an active script at original pharmacy, you will need to call your pharmacy and have scripts transferred to new pharmacy. This is completed between the pharmacy locations and not by your provider.    Results: If any images or labs were ordered, it can take up to 1 week to get results depending on the test ordered and the lab/facility running and resulting the test. - Normal or stable results, which do not need further discussion, may be released to your mychart immediately with attached  note to you. A call may not be generated for normal results. Please make certain to sign up for mychart. If you have questions on how to activate your mychart you can call the front office.  - If your results need further discussion, our office will attempt to contact you via phone, and if unable to reach you after 2 attempts, we will release your abnormal result to your mychart with instructions.  - All results will be automatically released in mychart after 1 week.  - Your provider will provide you with explanation and instruction on all relevant material in your results. Please keep in mind, results and labs may appear confusing or abnormal to the untrained eye, but it does not mean they are actually abnormal for you personally. If you have any questions about your results that are not covered, or you desire more detailed explanation than what was provided, you should make an appointment with your provider to do so.   Our office handles many outgoing and incoming calls daily. If we have not contacted you within 1 week about your results, please check your mychart to see if there is a message first and if not, then contact our office.  In helping with this matter, you help decrease call volume, and therefore allow [place] to be able to respond to patients needs more efficiently.   Acute office visits (sick visit):  An acute visit is intended for a new problem and are  scheduled in shorter time slots to allow schedule openings for patients with new problems. This is the appropriate visit to discuss a new problem. In order to provide you with excellent quality medical care with proper time for you to explain your problem, have an exam and receive treatment with instructions, these appointments should be limited to one new problem per visit. If you experience a new problem, in which you desire to be addressed, please make an acute office visit, we save openings on the schedule to accommodate you. Please do not save  your new problem for any other type of visit, let us take care of it properly and quickly for you.   Follow up visits:  Depending on your condition(s) your provider will need to see you routinely in order to provide you with quality care and prescribe medication(s). Most chronic conditions (Example: hypertension, Diabetes, depression/anxiety... etc), require visits a couple times a year. Your provider will instruct you on proper follow up for your personal medical conditions and history. Please make certain to make follow up appointments for your condition as instructed. Failing to do so could result in lapse in your medication treatment/refills. If you request a refill, and are overdue to be seen on a condition, we will always provide you with a 30 day script (once) to allow you time to schedule.    Medicare wellness (well visit): - we have a wonderful Nurse Maudie Mercury), that will meet with you and provide you will yearly medicare wellness visits. These visits should occur yearly (can not be scheduled less than 1 calendar year apart) and cover preventive health, immunizations, advance directives and screenings you are entitled to yearly through your medicare benefits. Do not miss out on your entitled benefits, this is when medicare will pay for these benefits to be ordered for you.  These are strongly encouraged by your provider and is the appropriate type of visit to make certain you are up to date with all preventive health benefits. If you have not had your medicare wellness exam in the last 12 months, please make certain to schedule one by calling the office and schedule your medicare wellness with Maudie Mercury as soon as possible.   Yearly physical (well visit):  - Adults are recommended to be seen yearly for physicals. Check with your insurance and date of your last physical, most insurances require one calendar year between physicals. Physicals include all preventive health topics, screenings, medical exam and  labs that are appropriate for gender/age and history. You may have fasting labs needed at this visit. This is a well visit (not a sick visit), new problems should not be covered during this visit (see acute visit).  - Pediatric patients are seen more frequently when they are younger. Your provider will advise you on well child visit timing that is appropriate for your their age. - This is not a medicare wellness visit. Medicare wellness exams do not have an exam portion to the visit. Some medicare companies allow for a physical, some do not allow a yearly physical. If your medicare allows a yearly physical you can schedule the medicare wellness with our nurse Maudie Mercury and have your physical with your provider after, on the same day. Please check with insurance for your full benefits.   Late Policy/No Shows:  - all new patients should arrive 15-30 minutes earlier than appointment to allow [place] time  to  obtain all personal demographics,  insurance information and for you to complete office paperwork. - All  established patients should arrive 10-15 minutes earlier than appointment time to update all information and be checked in .  - In our best efforts to run on time, if you are late for your appointment you will be asked to either reschedule or if able, we will work you back into the schedule. There will be a wait time to work you back in the schedule,  depending on availability.  - If you are unable to make it to your appointment as scheduled, please call 24 hours ahead of time to allow [place] to fill the time slot with someone else who needs to be seen. If you do not cancel your appointment ahead of time, you may be charged a no show fee.

## 2017-04-02 DIAGNOSIS — M5430 Sciatica, unspecified side: Secondary | ICD-10-CM

## 2017-04-02 HISTORY — DX: Sciatica, unspecified side: M54.30

## 2017-05-07 ENCOUNTER — Encounter: Payer: Self-pay | Admitting: *Deleted

## 2017-05-07 ENCOUNTER — Other Ambulatory Visit: Payer: Self-pay | Admitting: Family Medicine

## 2017-05-07 DIAGNOSIS — F4323 Adjustment disorder with mixed anxiety and depressed mood: Secondary | ICD-10-CM

## 2017-05-17 ENCOUNTER — Ambulatory Visit (INDEPENDENT_AMBULATORY_CARE_PROVIDER_SITE_OTHER): Payer: Managed Care, Other (non HMO) | Admitting: Family Medicine

## 2017-05-17 ENCOUNTER — Encounter: Payer: Self-pay | Admitting: Family Medicine

## 2017-05-17 VITALS — BP 110/72 | HR 83 | Temp 97.8°F | Ht 66.0 in | Wt 198.1 lb

## 2017-05-17 DIAGNOSIS — M5416 Radiculopathy, lumbar region: Secondary | ICD-10-CM | POA: Diagnosis not present

## 2017-05-17 DIAGNOSIS — F4323 Adjustment disorder with mixed anxiety and depressed mood: Secondary | ICD-10-CM

## 2017-05-17 DIAGNOSIS — Z23 Encounter for immunization: Secondary | ICD-10-CM

## 2017-05-17 MED ORDER — GABAPENTIN 300 MG PO CAPS: 300.0000 mg | ORAL_CAPSULE | Freq: Three times a day (TID) | ORAL | 1 refills | Status: DC

## 2017-05-17 MED ORDER — SERTRALINE HCL 100 MG PO TABS: 100.0000 mg | ORAL_TABLET | Freq: Every day | ORAL | 1 refills | Status: DC

## 2017-05-17 NOTE — Progress Notes (Signed)
Kristy Cooper , 1965/11/21, 52 y.o., female MRN: [id number] Patient Care Team    Relationship Specialty Notifications Start End  [name], DO PCP - General Family Medicine  11/02/15     Chief Complaint  Patient presents with  . Follow-up    for Med Refill    Subjective:  Adjustment disorder with mixed anxiety and depressed mood Patient presents for follow-up on anxiety and depression today. Is doing well on medication and would like refills today. She denies any notable side effects. She feels she is stable on this medication.  Prior note: Pt has been on zoloft 100 mg QD by prior provider a few years ago. She reports when she lost her insurance she had to abruptly come of medication and did not like the way the discontinuation process made her feel. She tolerated the zolfot use well, and reports feeling better in the medicine. She has had increase stress lately surrounding the care of her mother and now her facial twitching, which has caused her great anxiety. She would like to restart the medicine.    Depression screen [facility] 2/9 05/17/2017 11/22/2016 07/30/2016  Decreased Interest 0 0 0  Down, Depressed, Hopeless 0 0 0  PHQ - 2 Score 0 0 0  Altered sleeping 1 2 -  Tired, decreased energy 1 2 -  Change in appetite 0 0 -  Feeling bad or failure about yourself  0 0 -  Trouble concentrating 0 0 -  Moving slowly or fidgety/restless 0 2 -  Suicidal thoughts 0 0 -  PHQ-9 Score 2 6 -  Difficult doing work/chores - Not difficult at all -    Low back pain: Patient has been doing well on gabapentin 200 mg 3 times a day. She initially had some sedation however that has improved. She continues a little bit more coverage, but overall is happy with the result. Prior note This is a new problem for patient. She states she has had right-sided low back pain for about one month. There was no increase in activity, lifting or known injury acutely. She states she did have an injury when she  was a young child, which did not need any medical intervention, but nothing recent. However over the last month she started with right sided lower back pain, that radiated into her buttock and now is radiating down to her right foot. She reports tingling sensation intermittently in her right foot. She denies any weakness. She denies any bladder or bowel changes.   No results found for this or any previous visit (from the past 2160 hour(s)).   Mr [name] Contrast  Result Date: 03/07/2016 CLINICAL DATA:  [place] 5 months ago, striking back of head. I twitching, facial twitching, headache, extremity tingling for 1 month. 5 minute episode of blurry vision. EXAM: MRI HEAD WITHOUT AND WITH CONTRAST TECHNIQUE: Multiplanar, multiecho pulse sequences of the brain and surrounding structures were obtained without and with intravenous contrast. CONTRAST:  3mL MULTIHANCE GADOBENATE DIMEGLUMINE 529 MG/ML IV SOLN COMPARISON:  None. FINDINGS: BRAIN: No reduced diffusion to suggest acute ischemia. No susceptibility artifact to suggest hemorrhage. The ventricles and sulci are normal for patient's age. No suspicious parenchymal signal, masses or mass effect. No abnormal extra-axial fluid collections. No extra-axial masses though, contrast enhanced sequences would be more sensitive. VASCULAR: Normal major intracranial vascular flow voids present at skull base. SKULL AND UPPER CERVICAL SPINE: No abnormal sellar expansion. No suspicious calvarial bone marrow signal. Craniocervical junction maintained. SINUSES/ORBITS:  The mastoid air-cells and included paranasal sinuses are well-aerated. The included ocular globes and orbital contents are non-suspicious. OTHER: None. IMPRESSION: Normal MRI head with and without contrast. Electronically Signed   By: [name]   On: 03/07/2016 05:53   No results found for this or any previous visit (from the past 24 hour(s)).   Allergies  Allergen Reactions  . Meloxicam Swelling    . Voltaren [Diclofenac] Swelling   Social History   Tobacco Use  . Smoking status: Former Research scientist (life sciences)  . Smokeless tobacco: Never Used  Substance Use Topics  . Alcohol use: No   Past Medical History:  Diagnosis Date  . Bilateral foot pain    chronic- told arthritis and was prescribed tramadol.   . Colon polyp 2014   tubular adenoma  . Depression   . Depression with anxiety   . GERD (gastroesophageal reflux disease)   . Obesity   . Prediabetes   . Right rotator cuff tendinitis 2013   Past Surgical History:  Procedure Laterality Date  . CESAREAN SECTION     Family History  Problem Relation Age of Onset  . Breast cancer Mother   . Diabetes Mother   . Mental illness Mother   . Heart disease Mother   . Arthritis Mother   . Arthritis Father   . Mental illness Father   . Breast cancer Maternal Aunt   . Diabetes Maternal Aunt   . Arthritis Maternal Aunt   . Colon cancer Maternal Uncle   . Diabetes Maternal Uncle   . Colon cancer Paternal Aunt   . Diabetes Paternal Aunt   . Heart disease Paternal Aunt    Allergies as of 05/17/2017      Reactions   Meloxicam Swelling   Voltaren [diclofenac] Swelling      Medication List        Accurate as of 05/17/17 11:52 AM. Always use your most recent med list.          gabapentin 300 MG capsule Commonly known as:  NEURONTIN Take 1 capsule (300 mg total) by mouth 3 (three) times daily.   sertraline 100 MG tablet Commonly known as:  ZOLOFT Take 1 tablet (100 mg total) by mouth daily.       No results found for this or any previous visit (from the past 24 hour(s)). No results found.   ROS: Negative, with the exception of above mentioned in HPI   Objective:  BP 110/72 (BP Location: Right Arm, Patient Position: Sitting, Cuff Size: Normal)   Pulse 83   Temp 97.8 F (36.6 C) (Oral)   Ht 5\' 6"  (1.676 m)   Wt 198 lb 1.9 oz (89.9 kg)   LMP 05/03/2017   SpO2 97%   BMI 31.98 kg/m  Body mass index is 31.98 kg/m.  Gen:  Afebrile. No acute distress. No acute distress, nontoxic. Very pleasant Caucasian female, obese. HENT: AT. [place].  MMM.  Eyes:Pupils Equal Round Reactive to light, Extraocular movements intact,  Conjunctiva without redness, discharge or icterus. CV: RRR, no edema Chest: CTAB, no wheeze or crackles Neuro:  Normal gait. PERLA. EOMi. Alert. Oriented x3  Psych: Normal affect, dress and demeanor. Normal speech. Normal thought content and judgment.   Assessment/Plan: Ilah Boule is a 52 y.o. female present for followup OV for  Adjustment disorder with mixed anxiety and depressed mood - Stable today. Refills on Zoloft. - Continue Zoloft 100 mg daily. - Follow-up in 6 months, sooner if needed.  Lumbar radiculopathy -  Could use a little bit more coverage. Advised patient to start 300 mg 3 times a day, if she has sedated effects she could try to decrease frequency to twice a day. - gabapentin (NEURONTIN) 300 MG capsule; Take 1 capsule (300 mg total) by mouth 3 (three) times daily.  Dispense: 180 capsule; Refill: 1  Immunization due - Flu Vaccine QUAD 6+ mos PF IM (Fluarix Quad PF)    electronically signed by:  Howard Pouch, DO  [place]

## 2017-05-17 NOTE — Patient Instructions (Signed)
Gabapentin dose increased slightly per pill to 300 mg--> one pill. If this makes you more tired then try taking every 12 hours instead of every 8 hours.   AS long as doing well, followup in 6 months, sooner if needed.    Please help [place] help you:  We are honored you have chosen [facility] for your Primary Care home. Below you will find basic instructions that you may need to access in the future. Please help [place] help you by reading the instructions, which cover many of the frequent questions we experience.   Prescription refills and request:  -In order to allow more efficient response time, please call your pharmacy for all refills. They will forward the request electronically to [place]. This allows for the quickest possible response. Request left on a nurse line can take longer to refill, since these are checked as time allows between office patients and other phone calls.  - refill request can take up to 3-5 working days to complete.  - If request is sent electronically and request is appropiate, it is usually completed in 1-2 business days.  - all patients will need to be seen routinely for all chronic medical conditions requiring prescription medications (see follow-up below). If you are overdue for follow up on your condition, you will be asked to make an appointment and we will call in enough medication to cover you until your appointment (up to 30 days).  - all controlled substances will require a face to face visit to request/refill.  - if you desire your prescriptions to go through a new pharmacy, and have an active script at original pharmacy, you will need to call your pharmacy and have scripts transferred to new pharmacy. This is completed between the pharmacy locations and not by your provider.    Results: If any images or labs were ordered, it can take up to 1 week to get results depending on the test ordered and the lab/facility running and resulting the test. - Normal or stable  results, which do not need further discussion, may be released to your mychart immediately with attached note to you. A call may not be generated for normal results. Please make certain to sign up for mychart. If you have questions on how to activate your mychart you can call the front office.  - If your results need further discussion, our office will attempt to contact you via phone, and if unable to reach you after 2 attempts, we will release your abnormal result to your mychart with instructions.  - All results will be automatically released in mychart after 1 week.  - Your provider will provide you with explanation and instruction on all relevant material in your results. Please keep in mind, results and labs may appear confusing or abnormal to the untrained eye, but it does not mean they are actually abnormal for you personally. If you have any questions about your results that are not covered, or you desire more detailed explanation than what was provided, you should make an appointment with your provider to do so.   Our office handles many outgoing and incoming calls daily. If we have not contacted you within 1 week about your results, please check your mychart to see if there is a message first and if not, then contact our office.  In helping with this matter, you help decrease call volume, and therefore allow [place] to be able to respond to patients needs more efficiently.   Acute office  visits (sick visit):  An acute visit is intended for a new problem and are scheduled in shorter time slots to allow schedule openings for patients with new problems. This is the appropriate visit to discuss a new problem. In order to provide you with excellent quality medical care with proper time for you to explain your problem, have an exam and receive treatment with instructions, these appointments should be limited to one new problem per visit. If you experience a new problem, in which you desire to be addressed,  please make an acute office visit, we save openings on the schedule to accommodate you. Please do not save your new problem for any other type of visit, let us take care of it properly and quickly for you.   Follow up visits:  Depending on your condition(s) your provider will need to see you routinely in order to provide you with quality care and prescribe medication(s). Most chronic conditions (Example: hypertension, Diabetes, depression/anxiety... etc), require visits a couple times a year. Your provider will instruct you on proper follow up for your personal medical conditions and history. Please make certain to make follow up appointments for your condition as instructed. Failing to do so could result in lapse in your medication treatment/refills. If you request a refill, and are overdue to be seen on a condition, we will always provide you with a 30 day script (once) to allow you time to schedule.    Medicare wellness (well visit): - we have a wonderful Nurse Maudie Mercury), that will meet with you and provide you will yearly medicare wellness visits. These visits should occur yearly (can not be scheduled less than 1 calendar year apart) and cover preventive health, immunizations, advance directives and screenings you are entitled to yearly through your medicare benefits. Do not miss out on your entitled benefits, this is when medicare will pay for these benefits to be ordered for you.  These are strongly encouraged by your provider and is the appropriate type of visit to make certain you are up to date with all preventive health benefits. If you have not had your medicare wellness exam in the last 12 months, please make certain to schedule one by calling the office and schedule your medicare wellness with Maudie Mercury as soon as possible.   Yearly physical (well visit):  - Adults are recommended to be seen yearly for physicals. Check with your insurance and date of your last physical, most insurances require one  calendar year between physicals. Physicals include all preventive health topics, screenings, medical exam and labs that are appropriate for gender/age and history. You may have fasting labs needed at this visit. This is a well visit (not a sick visit), new problems should not be covered during this visit (see acute visit).  - Pediatric patients are seen more frequently when they are younger. Your provider will advise you on well child visit timing that is appropriate for your their age. - This is not a medicare wellness visit. Medicare wellness exams do not have an exam portion to the visit. Some medicare companies allow for a physical, some do not allow a yearly physical. If your medicare allows a yearly physical you can schedule the medicare wellness with our nurse Maudie Mercury and have your physical with your provider after, on the same day. Please check with insurance for your full benefits.   Late Policy/No Shows:  - all new patients should arrive 15-30 minutes earlier than appointment to allow [place] time  to  obtain  all personal demographics,  insurance information and for you to complete office paperwork. - All established patients should arrive 10-15 minutes earlier than appointment time to update all information and be checked in .  - In our best efforts to run on time, if you are late for your appointment you will be asked to either reschedule or if able, we will work you back into the schedule. There will be a wait time to work you back in the schedule,  depending on availability.  - If you are unable to make it to your appointment as scheduled, please call 24 hours ahead of time to allow [place] to fill the time slot with someone else who needs to be seen. If you do not cancel your appointment ahead of time, you may be charged a no show fee.

## 2017-05-20 ENCOUNTER — Other Ambulatory Visit: Payer: Self-pay | Admitting: *Deleted

## 2017-05-20 DIAGNOSIS — M5416 Radiculopathy, lumbar region: Secondary | ICD-10-CM

## 2017-05-20 MED ORDER — GABAPENTIN 300 MG PO CAPS: 300.0000 mg | ORAL_CAPSULE | Freq: Three times a day (TID) | ORAL | 1 refills | Status: DC

## 2017-07-04 ENCOUNTER — Ambulatory Visit: Payer: Managed Care, Other (non HMO) | Admitting: Family Medicine

## 2017-07-05 ENCOUNTER — Ambulatory Visit: Payer: Managed Care, Other (non HMO) | Admitting: Family Medicine

## 2017-07-08 ENCOUNTER — Ambulatory Visit: Payer: Managed Care, Other (non HMO) | Admitting: Family Medicine

## 2017-10-21 ENCOUNTER — Other Ambulatory Visit: Payer: Self-pay | Admitting: Family Medicine

## 2017-10-21 DIAGNOSIS — F4323 Adjustment disorder with mixed anxiety and depressed mood: Secondary | ICD-10-CM

## 2017-11-04 ENCOUNTER — Encounter: Payer: Self-pay | Admitting: Family Medicine

## 2017-11-08 ENCOUNTER — Encounter: Payer: Self-pay | Admitting: Family Medicine

## 2017-11-08 ENCOUNTER — Ambulatory Visit (INDEPENDENT_AMBULATORY_CARE_PROVIDER_SITE_OTHER): Payer: Managed Care, Other (non HMO) | Admitting: Family Medicine

## 2017-11-08 VITALS — BP 119/76 | HR 86 | Temp 98.3°F | Resp 20 | Ht 66.0 in | Wt 202.0 lb

## 2017-11-08 DIAGNOSIS — F4323 Adjustment disorder with mixed anxiety and depressed mood: Secondary | ICD-10-CM | POA: Diagnosis not present

## 2017-11-08 DIAGNOSIS — M5416 Radiculopathy, lumbar region: Secondary | ICD-10-CM

## 2017-11-08 MED ORDER — SERTRALINE HCL 100 MG PO TABS: 100.0000 mg | ORAL_TABLET | Freq: Every day | ORAL | 1 refills | Status: DC

## 2017-11-08 MED ORDER — GABAPENTIN 400 MG PO CAPS
400.0000 mg | ORAL_CAPSULE | Freq: Three times a day (TID) | ORAL | 1 refills | Status: DC
Start: 2017-11-08 — End: 2018-05-12

## 2017-11-08 NOTE — Patient Instructions (Signed)
Increased your gabapentin to 400 mg every 8 hours. Refilled zoloft.  Please help [place] help you:  We are honored you have chosen [facility] for your Primary Care home. Below you will find basic instructions that you may need to access in the future. Please help [place] help you by reading the instructions, which cover many of the frequent questions we experience.   Prescription refills and request:  -In order to allow more efficient response time, please call your pharmacy for all refills. They will forward the request electronically to [place]. This allows for the quickest possible response. Request left on a nurse line can take longer to refill, since these are checked as time allows between office patients and other phone calls.  - refill request can take up to 3-5 working days to complete.  - If request is sent electronically and request is appropiate, it is usually completed in 1-2 business days.  - all patients will need to be seen routinely for all chronic medical conditions requiring prescription medications (see follow-up below). If you are overdue for follow up on your condition, you will be asked to make an appointment and we will call in enough medication to cover you until your appointment (up to 30 days).  - all controlled substances will require a face to face visit to request/refill.  - if you desire your prescriptions to go through a new pharmacy, and have an active script at original pharmacy, you will need to call your pharmacy and have scripts transferred to new pharmacy. This is completed between the pharmacy locations and not by your provider.    Results: If any images or labs were ordered, it can take up to 1 week to get results depending on the test ordered and the lab/facility running and resulting the test. - Normal or stable results, which do not need further discussion, may be released to your mychart immediately with attached note to you. A call may not be generated for normal  results. Please make certain to sign up for mychart. If you have questions on how to activate your mychart you can call the front office.  - If your results need further discussion, our office will attempt to contact you via phone, and if unable to reach you after 2 attempts, we will release your abnormal result to your mychart with instructions.  - All results will be automatically released in mychart after 1 week.  - Your provider will provide you with explanation and instruction on all relevant material in your results. Please keep in mind, results and labs may appear confusing or abnormal to the untrained eye, but it does not mean they are actually abnormal for you personally. If you have any questions about your results that are not covered, or you desire more detailed explanation than what was provided, you should make an appointment with your provider to do so.   Our office handles many outgoing and incoming calls daily. If we have not contacted you within 1 week about your results, please check your mychart to see if there is a message first and if not, then contact our office.  In helping with this matter, you help decrease call volume, and therefore allow [place] to be able to respond to patients needs more efficiently.   Acute office visits (sick visit):  An acute visit is intended for a new problem and are scheduled in shorter time slots to allow schedule openings for patients with new problems. This is the appropriate  visit to discuss a new problem. Problems will not be addressed by phone call or Echart message. Appointment is needed if requesting treatment. In order to provide you with excellent quality medical care with proper time for you to explain your problem, have an exam and receive treatment with instructions, these appointments should be limited to one new problem per visit. If you experience a new problem, in which you desire to be addressed, please make an acute office visit, we save  openings on the schedule to accommodate you. Please do not save your new problem for any other type of visit, let us take care of it properly and quickly for you.   Follow up visits:  Depending on your condition(s) your provider will need to see you routinely in order to provide you with quality care and prescribe medication(s). Most chronic conditions (Example: hypertension, Diabetes, depression/anxiety... etc), require visits a couple times a year. Your provider will instruct you on proper follow up for your personal medical conditions and history. Please make certain to make follow up appointments for your condition as instructed. Failing to do so could result in lapse in your medication treatment/refills. If you request a refill, and are overdue to be seen on a condition, we will always provide you with a 30 day script (once) to allow you time to schedule.    Medicare wellness (well visit): - we have a wonderful Nurse Maudie Mercury), that will meet with you and provide you will yearly medicare wellness visits. These visits should occur yearly (can not be scheduled less than 1 calendar year apart) and cover preventive health, immunizations, advance directives and screenings you are entitled to yearly through your medicare benefits. Do not miss out on your entitled benefits, this is when medicare will pay for these benefits to be ordered for you.  These are strongly encouraged by your provider and is the appropriate type of visit to make certain you are up to date with all preventive health benefits. If you have not had your medicare wellness exam in the last 12 months, please make certain to schedule one by calling the office and schedule your medicare wellness with Maudie Mercury as soon as possible.   Yearly physical (well visit):  - Adults are recommended to be seen yearly for physicals. Check with your insurance and date of your last physical, most insurances require one calendar year between physicals. Physicals  include all preventive health topics, screenings, medical exam and labs that are appropriate for gender/age and history. You may have fasting labs needed at this visit. This is a well visit (not a sick visit), new problems should not be covered during this visit (see acute visit).  - Pediatric patients are seen more frequently when they are younger. Your provider will advise you on well child visit timing that is appropriate for your their age. - This is not a medicare wellness visit. Medicare wellness exams do not have an exam portion to the visit. Some medicare companies allow for a physical, some do not allow a yearly physical. If your medicare allows a yearly physical you can schedule the medicare wellness with our nurse Maudie Mercury and have your physical with your provider after, on the same day. Please check with insurance for your full benefits.   Late Policy/No Shows:  - all new patients should arrive 15-30 minutes earlier than appointment to allow [place] time  to  obtain all personal demographics,  insurance information and for you to complete office paperwork. - All established  patients should arrive 10-15 minutes earlier than appointment time to update all information and be checked in .  - In our best efforts to run on time, if you are late for your appointment you will be asked to either reschedule or if able, we will work you back into the schedule. There will be a wait time to work you back in the schedule,  depending on availability.  - If you are unable to make it to your appointment as scheduled, please call 24 hours ahead of time to allow [place] to fill the time slot with someone else who needs to be seen. If you do not cancel your appointment ahead of time, you may be charged a no show fee.

## 2017-11-08 NOTE — Progress Notes (Signed)
Kristy Cooper , Oct 06, 1965, 52 y.o., female MRN: [id number] Patient Care Team    Relationship Specialty Notifications Start End  Kristy Hillock, DO PCP - General Family Medicine  11/02/15     Chief Complaint  Patient presents with  . Depression  . Anxiety  . Back Pain    sciatica    Subjective:  Adjustment disorder with mixed anxiety and depressed mood Patient presents for follow-up on anxiety and depression today. She reports the zoloft 100 mg Qd is working well for her. She would like refills today.   Low back pain: Patient has been doing well on gabapentin 300 mg 3 times a day. However, she still feels she could use more coverage.  She denies sedation with use of medication.  She reports her right lower back with her sciatica has worsened since her recent trip to [place], in which they drove many hours.  Prior note This is a new problem for patient. She states she has had right-sided low back pain for about one month. There was no increase in activity, lifting or known injury acutely. She states she did have an injury when she was a young child, which did not need any medical intervention, but nothing recent. However over the last month she started with right sided lower back pain, that radiated into her buttock and now is radiating down to her right foot. She reports tingling sensation intermittently in her right foot. She denies any weakness. She denies any bladder or bowel changes.   Depression screen [facility] 2/9 05/17/2017 11/22/2016 07/30/2016  Decreased Interest 0 0 0  Down, Depressed, Hopeless 0 0 0  PHQ - 2 Score 0 0 0  Altered sleeping 1 2 -  Tired, decreased energy 1 2 -  Change in appetite 0 0 -  Feeling bad or failure about yourself  0 0 -  Trouble concentrating 0 0 -  Moving slowly or fidgety/restless 0 2 -  Suicidal thoughts 0 0 -  PHQ-9 Score 2 6 -  Difficult doing work/chores - Not difficult at all -   No results found for this or any previous visit (from  the past 2160 hour(s)).  No results found for this or any previous visit (from the past 24 hour(s)).  Allergies  Allergen Reactions  . Meloxicam Swelling  . Voltaren [Diclofenac] Swelling   Social History   Tobacco Use  . Smoking status: Former Research scientist (life sciences)  . Smokeless tobacco: Never Used  Substance Use Topics  . Alcohol use: No   Past Medical History:  Diagnosis Date  . Bilateral foot pain    chronic- told arthritis and was prescribed tramadol.   . Colon polyp 2014   tubular adenoma  . Depression   . Depression with anxiety   . GERD (gastroesophageal reflux disease)   . Obesity   . Prediabetes   . Right rotator cuff tendinitis 2013   Past Surgical History:  Procedure Laterality Date  . CESAREAN SECTION     Family History  Problem Relation Age of Onset  . Breast cancer Mother   . Diabetes Mother   . Mental illness Mother   . Heart disease Mother   . Arthritis Mother   . Arthritis Father   . Mental illness Father   . Breast cancer Maternal Aunt   . Diabetes Maternal Aunt   . Arthritis Maternal Aunt   . Colon cancer Maternal Uncle   . Diabetes Maternal Uncle   . Colon cancer Paternal Aunt   .  Diabetes Paternal Aunt   . Heart disease Paternal Aunt    Allergies as of 11/08/2017      Reactions   Meloxicam Swelling   Voltaren [diclofenac] Swelling      Medication List        Accurate as of 11/08/17  4:41 PM. Always use your most recent med list.          gabapentin 400 MG capsule Commonly known as:  NEURONTIN Take 1 capsule (400 mg total) by mouth 3 (three) times daily.   sertraline 100 MG tablet Commonly known as:  ZOLOFT Take 1 tablet (100 mg total) by mouth daily.       No results found for this or any previous visit (from the past 24 hour(s)). No results found.  ROS: Negative, with the exception of above mentioned in HPI  Objective:  BP 119/76 (BP Location: Left Arm, Patient Position: Sitting, Cuff Size: Large)   Pulse 86   Temp 98.3 F (36.8  C)   Resp 20   Ht 5\' 6"  (1.676 m)   Wt 202 lb (91.6 kg)   LMP 10/14/2017   SpO2 96%   BMI 32.60 kg/m  Body mass index is 32.6 kg/m.  Gen: Afebrile. No acute distress.  Nontoxic.  Obese Caucasian female. HENT: AT. [place].  MMM.  Eyes:Pupils Equal Round Reactive to light, Extraocular movements intact,  Conjunctiva without redness, discharge or icterus. CV: RRR Chest: CTAB, no wheeze or crackles Abd: Soft. NTND. BS present Skin: no rashes, purpura or petechiae.  Neuro:  Normal gait. PERLA. EOMi. Alert. Oriented x3  Psych: Normal affect, dress and demeanor. Normal speech. Normal thought content and judgment.  Assessment/Plan: Kristy Cooper is a 52 y.o. female present for followup OV for  Adjustment disorder with mixed anxiety and depressed mood - Stable today. Refills on Zoloft. - Continue Zoloft 100 mg daily. - Follow-up in 6 months, sooner if needed.  Lumbar radiculopathy - Could use a little bit more coverage.  Increased gabapentin to 400 mg 3 times daily. --> prescribed.  -Discussed adding amitriptyline and she would like to wait on this for now. - f/u 6 mos unless needed sooner.   electronically signed by:  Howard Pouch, DO  [place]

## 2018-05-12 ENCOUNTER — Encounter: Payer: Self-pay | Admitting: Family Medicine

## 2018-05-12 ENCOUNTER — Ambulatory Visit (INDEPENDENT_AMBULATORY_CARE_PROVIDER_SITE_OTHER): Payer: BLUE CROSS/BLUE SHIELD | Admitting: Family Medicine

## 2018-05-12 VITALS — BP 124/82 | HR 92 | Temp 98.1°F | Resp 16 | Ht 66.0 in | Wt 205.1 lb

## 2018-05-12 DIAGNOSIS — R7303 Prediabetes: Secondary | ICD-10-CM

## 2018-05-12 DIAGNOSIS — Z23 Encounter for immunization: Secondary | ICD-10-CM

## 2018-05-12 DIAGNOSIS — M5136 Other intervertebral disc degeneration, lumbar region: Secondary | ICD-10-CM

## 2018-05-12 DIAGNOSIS — M5416 Radiculopathy, lumbar region: Secondary | ICD-10-CM

## 2018-05-12 DIAGNOSIS — E669 Obesity, unspecified: Secondary | ICD-10-CM

## 2018-05-12 DIAGNOSIS — Z79899 Other long term (current) drug therapy: Secondary | ICD-10-CM | POA: Diagnosis not present

## 2018-05-12 DIAGNOSIS — E781 Pure hyperglyceridemia: Secondary | ICD-10-CM | POA: Diagnosis not present

## 2018-05-12 DIAGNOSIS — E785 Hyperlipidemia, unspecified: Secondary | ICD-10-CM | POA: Diagnosis not present

## 2018-05-12 DIAGNOSIS — F4323 Adjustment disorder with mixed anxiety and depressed mood: Secondary | ICD-10-CM | POA: Diagnosis not present

## 2018-05-12 LAB — BASIC METABOLIC PANEL
BUN: 14 mg/dL (ref 6–23)
CALCIUM: 9.8 mg/dL (ref 8.4–10.5)
CO2: 23 meq/L (ref 19–32)
Chloride: 105 mEq/L (ref 96–112)
Creatinine, Ser: 0.69 mg/dL (ref 0.40–1.20)
GFR: 89.18 mL/min (ref >60.00)
GLUCOSE: 119 mg/dL — AB (ref 70–99)
Potassium: 4.1 mEq/L (ref 3.5–5.1)
Sodium: 139 mEq/L (ref 135–145)

## 2018-05-12 LAB — LDL CHOLESTEROL, DIRECT: LDL DIRECT: 171 mg/dL

## 2018-05-12 LAB — HEMOGLOBIN A1C: HEMOGLOBIN A1C: 6.3 % (ref 4.6–6.5)

## 2018-05-12 LAB — LIPID PANEL
Cholesterol: 284 mg/dL — ABNORMAL HIGH (ref 0–200)
HDL: 36.8 mg/dL — ABNORMAL LOW (ref >39.00)
Total CHOL/HDL Ratio: 8
Triglycerides: 716 mg/dL — ABNORMAL HIGH (ref 0.0–149.0)

## 2018-05-12 LAB — TSH: TSH: 1.22 u[IU]/mL (ref 0.35–4.50)

## 2018-05-12 MED ORDER — GABAPENTIN 400 MG PO CAPS: 400.0000 mg | ORAL_CAPSULE | Freq: Three times a day (TID) | ORAL | 1 refills | Status: DC

## 2018-05-12 MED ORDER — SERTRALINE HCL 100 MG PO TABS: 100.0000 mg | ORAL_TABLET | Freq: Every day | ORAL | 1 refills | Status: DC

## 2018-05-12 NOTE — Patient Instructions (Signed)
We will call you with lab results.   I am glad you are doing well.   Please help [place] help you:  We are honored you have chosen [facility] for your Primary Care home. Below you will find basic instructions that you may need to access in the future. Please help [place] help you by reading the instructions, which cover many of the frequent questions we experience.   Prescription refills and request:  -In order to allow more efficient response time, please call your pharmacy for all refills. They will forward the request electronically to [place]. This allows for the quickest possible response. Request left on a nurse line can take longer to refill, since these are checked as time allows between office patients and other phone calls.  - refill request can take up to 3-5 working days to complete.  - If request is sent electronically and request is appropiate, it is usually completed in 1-2 business days.  - all patients will need to be seen routinely for all chronic medical conditions requiring prescription medications (see follow-up below). If you are overdue for follow up on your condition, you will be asked to make an appointment and we will call in enough medication to cover you until your appointment (up to 30 days).  - all controlled substances will require a face to face visit to request/refill.  - if you desire your prescriptions to go through a new pharmacy, and have an active script at original pharmacy, you will need to call your pharmacy and have scripts transferred to new pharmacy. This is completed between the pharmacy locations and not by your provider.    Results: If any images or labs were ordered, it can take up to 1 week to get results depending on the test ordered and the lab/facility running and resulting the test. - Normal or stable results, which do not need further discussion, may be released to your mychart immediately with attached note to you. A call may not be generated for normal  results. Please make certain to sign up for mychart. If you have questions on how to activate your mychart you can call the front office.  - If your results need further discussion, our office will attempt to contact you via phone, and if unable to reach you after 2 attempts, we will release your abnormal result to your mychart with instructions.  - All results will be automatically released in mychart after 1 week.  - Your provider will provide you with explanation and instruction on all relevant material in your results. Please keep in mind, results and labs may appear confusing or abnormal to the untrained eye, but it does not mean they are actually abnormal for you personally. If you have any questions about your results that are not covered, or you desire more detailed explanation than what was provided, you should make an appointment with your provider to do so.   Our office handles many outgoing and incoming calls daily. If we have not contacted you within 1 week about your results, please check your mychart to see if there is a message first and if not, then contact our office.  In helping with this matter, you help decrease call volume, and therefore allow [place] to be able to respond to patients needs more efficiently.   Acute office visits (sick visit):  An acute visit is intended for a new problem and are scheduled in shorter time slots to allow schedule openings for patients with  new problems. This is the appropriate visit to discuss a new problem. Problems will not be addressed by phone call or Echart message. Appointment is needed if requesting treatment. In order to provide you with excellent quality medical care with proper time for you to explain your problem, have an exam and receive treatment with instructions, these appointments should be limited to one new problem per visit. If you experience a new problem, in which you desire to be addressed, please make an acute office visit, we save  openings on the schedule to accommodate you. Please do not save your new problem for any other type of visit, let us take care of it properly and quickly for you.   Follow up visits:  Depending on your condition(s) your provider will need to see you routinely in order to provide you with quality care and prescribe medication(s). Most chronic conditions (Example: hypertension, Diabetes, depression/anxiety... etc), require visits a couple times a year. Your provider will instruct you on proper follow up for your personal medical conditions and history. Please make certain to make follow up appointments for your condition as instructed. Failing to do so could result in lapse in your medication treatment/refills. If you request a refill, and are overdue to be seen on a condition, we will always provide you with a 30 day script (once) to allow you time to schedule.    Medicare wellness (well visit): - we have a wonderful Nurse Maudie Mercury), that will meet with you and provide you will yearly medicare wellness visits. These visits should occur yearly (can not be scheduled less than 1 calendar year apart) and cover preventive health, immunizations, advance directives and screenings you are entitled to yearly through your medicare benefits. Do not miss out on your entitled benefits, this is when medicare will pay for these benefits to be ordered for you.  These are strongly encouraged by your provider and is the appropriate type of visit to make certain you are up to date with all preventive health benefits. If you have not had your medicare wellness exam in the last 12 months, please make certain to schedule one by calling the office and schedule your medicare wellness with Maudie Mercury as soon as possible.   Yearly physical (well visit):  - Adults are recommended to be seen yearly for physicals. Check with your insurance and date of your last physical, most insurances require one calendar year between physicals. Physicals  include all preventive health topics, screenings, medical exam and labs that are appropriate for gender/age and history. You may have fasting labs needed at this visit. This is a well visit (not a sick visit), new problems should not be covered during this visit (see acute visit).  - Pediatric patients are seen more frequently when they are younger. Your provider will advise you on well child visit timing that is appropriate for your their age. - This is not a medicare wellness visit. Medicare wellness exams do not have an exam portion to the visit. Some medicare companies allow for a physical, some do not allow a yearly physical. If your medicare allows a yearly physical you can schedule the medicare wellness with our nurse Maudie Mercury and have your physical with your provider after, on the same day. Please check with insurance for your full benefits.   Late Policy/No Shows:  - all new patients should arrive 15-30 minutes earlier than appointment to allow [place] time  to  obtain all personal demographics,  insurance information and for you to  complete office paperwork. - All established patients should arrive 10-15 minutes earlier than appointment time to update all information and be checked in .  - In our best efforts to run on time, if you are late for your appointment you will be asked to either reschedule or if able, we will work you back into the schedule. There will be a wait time to work you back in the schedule,  depending on availability.  - If you are unable to make it to your appointment as scheduled, please call 24 hours ahead of time to allow [place] to fill the time slot with someone else who needs to be seen. If you do not cancel your appointment ahead of time, you may be charged a no show fee.

## 2018-05-12 NOTE — Progress Notes (Signed)
Kristy [facility] , 18-Mar-1966, 53 y.o., female MRN: [id number] Patient Care Team    Relationship Specialty Notifications Start End  [name], DO PCP - General Family Medicine  11/02/15     Chief Complaint  Patient presents with  . Anxiety    No complaints. Needs refills on medications     Subjective:  Adjustment disorder with mixed anxiety and depressed mood Patient presents for follow-up on anxiety and depression today. She reports the zoloft 100 mg Qd is working very well for her. She would like refills today.   Low back pain/ DDD (degenerative disc disease),/ Lumbar radiculopathy: Patient has been doing well on gabapentin 400 mg 3 times a day. Good improvement w/ increased dose.   Prediabetes/obesity Last A1c 03/12/2016 6.0.  Patient has tried to watch her diet and exercise.  SHe has gained approximately 20 pounds in that time. Patient denies dizziness, hyperglycemic or hypoglycemic events. Patient denies numbness, tingling in the extremities or nonhealing wounds of feet.   Hyperlipidemia, unspecified hyperlipidemia type/ Hypertriglyceridemia Patient has a history of hyperlipidemia and hypertriglyceridemia.  Her last lipid panel 11/18/2015 with a total cholesterol 241, HDL 36, LDL 152, triglycerides 306.  She is not taking any medications for her condition.  He has a family history of heart disease in her mother.    Prior note This is a new problem for patient. She states she has had right-sided low back pain for about one month. There was no increase in activity, lifting or known injury acutely. She states she did have an injury when she was a young child, which did not need any medical intervention, but nothing recent. However over the last month she started with right sided lower back pain, that radiated into her buttock and now is radiating down to her right foot. She reports tingling sensation intermittently in her right foot. She denies any weakness. She denies any bladder  or bowel changes.   Depression screen [facility] 2/9 05/12/2018 11/08/2017 05/17/2017  Decreased Interest 0 0 0  Down, Depressed, Hopeless 0 0 0  PHQ - 2 Score 0 0 0  Altered sleeping 0 0 1  Tired, decreased energy 0 0 1  Change in appetite 0 0 0  Feeling bad or failure about yourself  1 0 0  Trouble concentrating 0 0 0  Moving slowly or fidgety/restless 0 0 0  Suicidal thoughts 0 0 0  PHQ-9 Score 1 0 2  Difficult doing work/chores Not difficult at all Not difficult at all -   GAD 7 : Generalized Anxiety Score 05/12/2018 05/17/2017  Nervous, Anxious, on Edge 0 0  Control/stop worrying 0 1  Worry too much - different things 0 1  Trouble relaxing 0 0  Restless 0 0  Easily annoyed or irritable 0 0  Afraid - awful might happen 0 1  Total GAD 7 Score 0 3  Anxiety Difficulty Not difficult at all -     No results found for this or any previous visit (from the past 24 hour(s)).  Allergies  Allergen Reactions  . Meloxicam Swelling  . Voltaren [Diclofenac] Swelling   Social History   Tobacco Use  . Smoking status: Former Research scientist (life sciences)  . Smokeless tobacco: Never Used  Substance Use Topics  . Alcohol use: No   Past Medical History:  Diagnosis Date  . Bilateral foot pain    chronic- told arthritis and was prescribed tramadol.   . Colon polyp 2014   tubular adenoma  . Depression   .  Depression with anxiety   . GERD (gastroesophageal reflux disease)   . Obesity   . Prediabetes   . Right rotator cuff tendinitis 2013   Past Surgical History:  Procedure Laterality Date  . CESAREAN SECTION     Family History  Problem Relation Age of Onset  . Breast cancer Mother   . Diabetes Mother   . Mental illness Mother   . Heart disease Mother   . Arthritis Mother   . Arthritis Father   . Mental illness Father   . Breast cancer Maternal Aunt   . Diabetes Maternal Aunt   . Arthritis Maternal Aunt   . Colon cancer Maternal Uncle   . Diabetes Maternal Uncle   . Colon cancer Paternal Aunt   .  Diabetes Paternal Aunt   . Heart disease Paternal Aunt    Allergies as of 05/12/2018      Reactions   Meloxicam Swelling   Voltaren [diclofenac] Swelling      Medication List       Accurate as of May 12, 2018  1:19 PM. Always use your most recent med list.        gabapentin 400 MG capsule Commonly known as:  NEURONTIN Take 1 capsule (400 mg total) by mouth 3 (three) times daily.   sertraline 100 MG tablet Commonly known as:  ZOLOFT Take 1 tablet (100 mg total) by mouth daily.       No results found for this or any previous visit (from the past 24 hour(s)). No results found.  ROS: Negative, with the exception of above mentioned in HPI  Objective:  BP 124/82 (BP Location: Left Arm, Patient Position: Sitting, Cuff Size: Normal)   Pulse 92   Temp 98.1 F (36.7 C) (Oral)   Resp 16   Ht 5\' 6"  (1.676 m)   Wt 205 lb 2 oz (93 kg)   LMP 04/14/2018 (Exact Date)   BMI 33.11 kg/m  Body mass index is 33.11 kg/m.  Gen: Afebrile. No acute distress.  Nontoxic in appearance.  Well-developed, well-nourished, obese, Caucasian female. HENT: AT. [place]. MMM.  Eyes:Pupils Equal Round Reactive to light, Extraocular movements intact,  Conjunctiva without redness, discharge or icterus. Neck/lymp/endocrine: Supple, no lymphadenopathy, no thyromegaly CV: RRR no murmur, no edema, +2/4 P posterior tibialis pulses Chest: CTAB, no wheeze or crackles Abd: Soft. NTND. BS present.  No masses palpated.  Skin: No rashes, purpura or petechiae.  Neuro:  Normal gait. PERLA. EOMi. Alert. Oriented x3  Psych: Normal affect, dress and demeanor. Normal speech. Normal thought content and judgment..   Assessment/Plan: Kristy [facility] is a 53 y.o. female present for followup OV for  Adjustment disorder with mixed anxiety and depressed mood - Stable. Today. Refills on Zoloft. - Continue Zoloft 100 mg daily. - Follow-up in 6 months, sooner if needed.  Lumbar radiculopathy -Stable. Continue gabapentin  400 mg 3 times daily.  Refills prescribed today.   -Discussed adding amitriptyline and she would like to wait on this for now. - f/u 6 mos unless needed sooner.   Prediabetes/obesity - diet and exercise. Consider metformin start if appropriate - HgB A1c  Hyperlipidemia, unspecified hyperlipidemia type/ Hypertriglyceridemia - currently not on medications. Fhx HD present in mother.  - diet and exercise.  - Basic Metabolic Panel (BMET) - Lipid panel - TSH  Flu shot administered today.  electronically signed by:  Howard Pouch, DO  [place]

## 2018-05-13 ENCOUNTER — Encounter: Payer: Self-pay | Admitting: Family Medicine

## 2018-05-13 ENCOUNTER — Telehealth: Payer: Self-pay | Admitting: Family Medicine

## 2018-05-13 MED ORDER — ATORVASTATIN CALCIUM 20 MG PO TABS: 20.0000 mg | ORAL_TABLET | Freq: Every day | ORAL | 3 refills | Status: DC

## 2018-05-13 NOTE — Telephone Encounter (Signed)
Pt returned call. Note by Dr Raoul Pitch read to patient. Pt verbalized understanding of all instructions.  Pt will try diet and exercise to try to improve her A1C. She has F/U scheduled for 3 months.  She refused nutrition referral and [employer] program Message left for Pt to call back for Cholesterol results missed on Dr Raoul Pitch note.

## 2018-05-13 NOTE — Telephone Encounter (Signed)
LM on patients cell phone to return call in regards to lab results.   Okay for P[facility] to discuss results/PCP recommendations.

## 2018-05-13 NOTE — Telephone Encounter (Deleted)
Pt returned call. Note by Dr Raoul Pitch read to patient. Pt verbalized understanding of all instructions.  Pt will try diet and exercise to try to improve her A1C. She has F/U scheduled for 3 months.  She refused nutrition referral and [employer] program Message left for Pt to call back for Cholesterol results missed on Dr Raoul Pitch note.

## 2018-05-13 NOTE — Telephone Encounter (Signed)
This encounter was created in error - please disregard.

## 2018-05-13 NOTE — Telephone Encounter (Signed)
Pt returned call to office and pt given remaining results and recommendations regarding cholesterol per notes of Dr. Raoul Pitch on 05/13/18. Pt verbalized understanding.

## 2018-05-13 NOTE — Telephone Encounter (Signed)
Please inform patient the following information: Her liver and kidney function is normal. She is at the very end of the spectrum of prediabetes-and A1c any higher would be considered diabetic hers is 6.3.  This is an average of 3 months-it is still accurate even if the patient has eaten that day such as she had. Recommendations:      -Research has shown that low-dose metformin, can be helpful in the prediabetes patient. It also the 1st line medication at higher doses for diabetes type 2.     - her options are to start exercise, diet annd low dose metformin now and follow up in 3 mos for recheck.  OR    - work on a low sugar/carbohydrate  diet, routine daily exercise and follow up in 3 mos for recheck and see if any improvement --> if needed will start med that visit.     -we can also refer her nutrition or she can make an appointment here to discuss diet and exercise if she would like additional guidance on this before her 3 mos follow up.       -There also the PREP program at [employer] we can refer her to that is 8-12 week program run by a nurse that helps with exercise and diet (there is a small fee to this-we have info we can give her - ask Diane)   Lastly, Her cholesterol is very high.  Total cholesterol 284-goal below 200, HDL which is the good cholesterol is 36-goal is around 40 or higher, LDL which is her bad cholesterol is 171-goal is at least below 130 and close to 100 for her.  We knew her triglycerides were higher before, and she was not fasting which makes her triglycerides appear even higher, however hers was astronomically high at 716 goal below 150 when fasting.  I do strongly recommend she start a medication for her cholesterol to give her cardiovascular protection.  The statin medication helps prevent stroke and heart attacks in addition to help lowering cholesterol.  I have called this medication in for her and we will recheck her cholesterol on her 10-month follow-up.  I do  recommend she be fasting for that appointment.  I

## 2018-08-01 ENCOUNTER — Encounter: Payer: Self-pay | Admitting: Family Medicine

## 2018-08-01 ENCOUNTER — Ambulatory Visit (INDEPENDENT_AMBULATORY_CARE_PROVIDER_SITE_OTHER): Payer: BLUE CROSS/BLUE SHIELD | Admitting: Family Medicine

## 2018-08-01 VITALS — Ht 66.0 in | Wt 201.0 lb

## 2018-08-01 DIAGNOSIS — R7303 Prediabetes: Secondary | ICD-10-CM

## 2018-08-01 DIAGNOSIS — E785 Hyperlipidemia, unspecified: Secondary | ICD-10-CM | POA: Diagnosis not present

## 2018-08-01 DIAGNOSIS — F4323 Adjustment disorder with mixed anxiety and depressed mood: Secondary | ICD-10-CM | POA: Diagnosis not present

## 2018-08-01 DIAGNOSIS — Z79899 Other long term (current) drug therapy: Secondary | ICD-10-CM

## 2018-08-01 DIAGNOSIS — M5416 Radiculopathy, lumbar region: Secondary | ICD-10-CM | POA: Diagnosis not present

## 2018-08-01 DIAGNOSIS — E669 Obesity, unspecified: Secondary | ICD-10-CM

## 2018-08-01 MED ORDER — SERTRALINE HCL 100 MG PO TABS: 100.0000 mg | ORAL_TABLET | Freq: Every day | ORAL | 1 refills | Status: DC

## 2018-08-01 MED ORDER — GABAPENTIN 400 MG PO CAPS: 400.0000 mg | ORAL_CAPSULE | Freq: Three times a day (TID) | ORAL | 1 refills | Status: DC

## 2018-08-01 NOTE — Progress Notes (Signed)
VIRTUAL VISIT VIA VIDEO  I connected with Kristy [facility] on 08/01/18 at  2:00 PM EDT by a video enabled telemedicine application and verified that I am speaking with the correct person using two identifiers. Location patient: Home Location provider: [facility] Persons participating in the virtual visit: Patient, Kristy [facility] and R.Baker, Kristy [facility]  I discussed the limitations of evaluation and management by telemedicine and the availability of in person appointments. The patient expressed understanding and agreed to proceed.   SUBJECTIVE Chief Complaint  Patient presents with  . Anxiety    Needs zoloft refilled. No complaints or concerns     HPI:  Adjustment disorder with mixed anxiety and depressed mood Patient presents for follow-up on anxiety and depression today. She reports the zoloft 100 mg Qd is working great for her. She would like refills today. No complaints.    Low back pain/ DDD (degenerative disc disease),/ Lumbar radiculopathy: Patient is doing well on gabapentin 400 mg 3 times a day. Good improvement in symptoms. No  Side effect  Prediabetes/obesity Last A1c 6.3.  Patient has tried to watch her diet and exercise.  SHe has now lost 5 lbs.  Patient denies dizziness, hyperglycemic or hypoglycemic events. Patient denies numbness, tingling in the extremities or nonhealing wounds of feet.   Hyperlipidemia, unspecified hyperlipidemia type/ Hypertriglyceridemia Patient has a history of hyperlipidemia and hypertriglyceridemia.  Her last lipid panel 05/12/2018-total cholesterol 284, HDL 36, LDL 171, triglycerides 716.  She has started the statin without side effects. She is also exercising and dieting.  Family history of heart disease in her mother.    Prior note This is a new problem for patient. She states she has had right-sided low back pain for about one month. There was no increase in activity, lifting or known injury acutely. She states she did have an  injury when she was a young child, which did not need any medical intervention, but nothing recent. However over the last month she started with right sided lower back pain, that radiated into her buttock and now is radiating down to her right foot. She reports tingling sensation intermittently in her right foot. She denies any weakness. She denies any bladder or bowel changes.   ROS: See pertinent positives and negatives per HPI.  Patient Active Problem List   Diagnosis Date Noted  . DDD (degenerative disc disease), lumbar 09/03/2016  . Lumbar radiculopathy 09/03/2016  . Adjustment disorder with mixed anxiety and depressed mood 07/30/2016  . Irregular menstrual cycle 03/14/2016  . Hyperlipidemia 11/23/2015  . Hypertriglyceridemia 11/23/2015  . Prediabetes 11/02/2015  . Abnormal facial hair 11/02/2015  . History of colon polyps 11/02/2015  . Obesity (BMI 30-39.9) 11/02/2015    Social History   Tobacco Use  . Smoking status: Former Research scientist (life sciences)  . Smokeless tobacco: Never Used  Substance Use Topics  . Alcohol use: No    Current Outpatient Medications:  .  atorvastatin (LIPITOR) 20 MG tablet, Take 1 tablet (20 mg total) by mouth daily., Disp: 90 tablet, Rfl: 3 .  gabapentin (NEURONTIN) 400 MG capsule, Take 1 capsule (400 mg total) by mouth 3 (three) times daily., Disp: 270 capsule, Rfl: 1 .  sertraline (ZOLOFT) 100 MG tablet, Take 1 tablet (100 mg total) by mouth daily., Disp: 90 tablet, Rfl: 1  Allergies  Allergen Reactions  . Meloxicam Swelling  . Voltaren [Diclofenac] Swelling    OBJECTIVE: Ht 5\' 6"  (1.676 m)   Wt 201 lb (91.2 kg)   LMP 06/01/2018  BMI 32.44 kg/m  Gen: No acute distress. Nontoxic in appearance.  HENT: AT. [place].  MMM.  Eyes:Pupils Equal Round Reactive to light, Extraocular movements intact,  Conjunctiva without redness, discharge or icterus. CV: no edema Chest: Cough or shortness of breath not present.  Neuro:Alert. Oriented x3  Psych: Normal affect, dress  and demeanor. Normal speech. Normal thought content and judgment.  ASSESSMENT AND PLAN: Kristy [facility] is a 53 y.o. female present for  Adjustment disorder with mixed anxiety and depressed mood - stable. Today. Refills on Zoloft. - Continue Zoloft 100 mg daily. - Follow-up in 6 months, sooner if needed.  Lumbar radiculopathy -stable Continue gabapentin 400 mg 3 times daily.  Refills prescribed today.   -Discussed adding amitriptyline and she would like to wait on this for now. - f/u 6 mos unless needed sooner.   Prediabetes/obesity - started to diet and exercise. Lost 5 lbs! Consider metformin start if appropriate - HgB A1c- future ordered.   Hyperlipidemia, unspecified hyperlipidemia type/ Hypertriglyceridemia - tolerating statin Fhx HD present in mother.  - diet and exercise.  - CMP, lipids, A1c  F/u 6 mos    Kristy Pouch, DO 08/01/2018

## 2018-08-06 ENCOUNTER — Other Ambulatory Visit (INDEPENDENT_AMBULATORY_CARE_PROVIDER_SITE_OTHER): Payer: BLUE CROSS/BLUE SHIELD

## 2018-08-06 ENCOUNTER — Other Ambulatory Visit: Payer: Self-pay

## 2018-08-06 DIAGNOSIS — E785 Hyperlipidemia, unspecified: Secondary | ICD-10-CM | POA: Diagnosis not present

## 2018-08-06 DIAGNOSIS — Z79899 Other long term (current) drug therapy: Secondary | ICD-10-CM

## 2018-08-06 DIAGNOSIS — R7303 Prediabetes: Secondary | ICD-10-CM | POA: Diagnosis not present

## 2018-08-06 LAB — COMPREHENSIVE METABOLIC PANEL
ALT: 14 U/L (ref 0–35)
AST: 15 U/L (ref 0–37)
Albumin: 4.5 g/dL (ref 3.5–5.2)
Alkaline Phosphatase: 93 U/L (ref 39–117)
BUN: 15 mg/dL (ref 6–23)
CO2: 26 mEq/L (ref 19–32)
Calcium: 9.7 mg/dL (ref 8.4–10.5)
Chloride: 103 mEq/L (ref 96–112)
Creatinine, Ser: 0.73 mg/dL (ref 0.40–1.20)
GFR: 83.48 mL/min (ref >60.00)
Glucose, Bld: 113 mg/dL — ABNORMAL HIGH (ref 70–99)
Potassium: 4.4 mEq/L (ref 3.5–5.1)
Sodium: 139 mEq/L (ref 135–145)
Total Bilirubin: 0.5 mg/dL (ref 0.2–1.2)
Total Protein: 7.2 g/dL (ref 6.0–8.3)

## 2018-08-06 LAB — LIPID PANEL
Cholesterol: 198 mg/dL (ref 0–200)
HDL: 34.7 mg/dL — ABNORMAL LOW (ref >39.00)
NonHDL: 163.56
Total CHOL/HDL Ratio: 6
Triglycerides: 245 mg/dL — ABNORMAL HIGH (ref 0.0–149.0)
VLDL: 49 mg/dL — ABNORMAL HIGH (ref 0.0–40.0)

## 2018-08-06 LAB — HEMOGLOBIN A1C: Hgb A1c MFr Bld: 6.3 % (ref 4.6–6.5)

## 2018-08-06 LAB — LDL CHOLESTEROL, DIRECT: Direct LDL: 125 mg/dL

## 2018-08-07 ENCOUNTER — Telehealth: Payer: Self-pay | Admitting: Family Medicine

## 2018-08-07 NOTE — Telephone Encounter (Signed)
Please inform patient the following information: Her a1c is the same ay 6.3. Continue diet and exercise.We will continue to monitor. Her cholesterol labs greatly improved. Cholesterol 284 now 198 LDL bad cholesterol 171 is now 125 tryGlycerides were 716 now 245>>> this should be below 150--> I would advise adding an omega 3 OTC supplement 1000 mg daily. That will target the triglycerides and hopefully bring them down the rest of the way.  Great results!

## 2018-08-07 NOTE — Telephone Encounter (Signed)
Pt sent My Chart message with results

## 2018-08-11 ENCOUNTER — Ambulatory Visit: Payer: BLUE CROSS/BLUE SHIELD | Admitting: Family Medicine

## 2018-08-12 NOTE — Telephone Encounter (Signed)
Pt was called and given lab results. She verbalized understanding.  

## 2018-10-29 ENCOUNTER — Telehealth: Payer: Self-pay

## 2018-10-29 ENCOUNTER — Ambulatory Visit (INDEPENDENT_AMBULATORY_CARE_PROVIDER_SITE_OTHER): Payer: BC Managed Care – PPO | Admitting: Family Medicine

## 2018-10-29 ENCOUNTER — Encounter: Payer: Self-pay | Admitting: Family Medicine

## 2018-10-29 ENCOUNTER — Other Ambulatory Visit: Payer: Self-pay

## 2018-10-29 VITALS — Ht 66.0 in | Wt 200.0 lb

## 2018-10-29 DIAGNOSIS — X509XXA Other and unspecified overexertion or strenuous movements or postures, initial encounter: Secondary | ICD-10-CM | POA: Diagnosis not present

## 2018-10-29 DIAGNOSIS — R42 Dizziness and giddiness: Secondary | ICD-10-CM | POA: Diagnosis not present

## 2018-10-29 NOTE — Telephone Encounter (Signed)
Pt was called by nurse for traige. Pt said she did eat and her sugar went up 132. She states she is not SOB when resting but when moving she gets some SOB. Pt was in a [place] with about 6 people 1 week ago, all wearing masks, to go to MD appt. None have tested positive for COVID that she is aware of. Has not been exposed to covid and nobody in the house is sick, denies fever. Pt was advised to go to ED if SOB got worse, she verbalized understanding

## 2018-10-29 NOTE — Progress Notes (Addendum)
VIRTUAL VISIT VIA VIDEO  I connected with Kristy Cooper on 10/29/2018 at  4:00 PM EDT by a video enabled telemedicine application and verified that I am speaking with the correct person using two identifiers. Location patient: Home Location provider: [facility], Office Persons participating in the virtual visit: Patient, Dr. Raoul Pitch and R.Baker, LPN  I discussed the limitations of evaluation and management by telemedicine and the availability of in person appointments. The patient expressed understanding and agreed to proceed.      Kristy Cooper , 1965-08-28, 53 y.o., female MRN: [id number] Patient Care Team    Relationship Specialty Notifications Start End  [name], DO PCP - General Family Medicine  11/02/15     Chief Complaint  Patient presents with  . Allergies    Pt ate blueberries about 5am and then two hours later starting having chills, SOB, dizziness. Checked BS and it was 67.  Pt took glocose tabs and went up to 132. She feels better now. Pt has not had period in 3 months and thinks she is going through menopause.      Subjective: Pt presents for an OV with complaints of dizziness and diaphoresis of approximately 1 hour duration.  Associated symptoms include mild shortness of breath.  Patient reports she woke up this morning around 5 AM ate some blueberries with some artificial sweetener.  She then exercised for about an hour on her exercise bike.  After exercising she became dizzy and broke out a sweat.  She then felt like she got short of breath.  She states she takes a glucose tab to bring her sugar up because she checked her sugar when she had the symptoms and her glucose was 67. She reports she felt better after taking in the sugar tabs and resting.  She denies any chest pain during this event.  She denies any syncope.  She has no known sick contacts.  She denies any fever.  She felt mildly chilled after only having broken out in a sweat.  She has  been on a keto diet for the last few weeks and has cut out all carbohydrates with the exception of what she is getting in her blueberries and green beans.  Depression screen [facility] 2/9 05/12/2018 11/08/2017 05/17/2017 11/22/2016 07/30/2016  Decreased Interest 0 0 0 0 0  Down, Depressed, Hopeless 0 0 0 0 0  PHQ - 2 Score 0 0 0 0 0  Altered sleeping 0 0 1 2 -  Tired, decreased energy 0 0 1 2 -  Change in appetite 0 0 0 0 -  Feeling bad or failure about yourself  1 0 0 0 -  Trouble concentrating 0 0 0 0 -  Moving slowly or fidgety/restless 0 0 0 2 -  Suicidal thoughts 0 0 0 0 -  PHQ-9 Score 1 0 2 6 -  Difficult doing work/chores Not difficult at all Not difficult at all - Not difficult at all -    Allergies  Allergen Reactions  . Meloxicam Swelling  . Voltaren [Diclofenac] Swelling   Social History   Social History Narrative   Married to [place]. 2 chilrn Bubba Hales)- special needs and an adult child. She also has a grandchild.    HS grad. Artist.   Drinks caffeine. Takes daily vitamin.   Wears seatbelt. Wears bicycle helmet. Smoke detector in the home.    Exercises routinely.    Feels safe in relationships.  Past Medical History:  Diagnosis Date  . Bilateral foot pain    chronic- told arthritis and was prescribed tramadol.   . Colon polyp 2014   tubular adenoma  . Depression   . Depression with anxiety   . GERD (gastroesophageal reflux disease)   . Hyperlipidemia LDL goal <130   . Obesity   . Prediabetes   . Right rotator cuff tendinitis 2013   Past Surgical History:  Procedure Laterality Date  . CESAREAN SECTION     Family History  Problem Relation Age of Onset  . Breast cancer Mother   . Diabetes Mother   . Mental illness Mother   . Heart disease Mother   . Arthritis Mother   . Arthritis Father   . Mental illness Father   . Breast cancer Maternal Aunt   . Diabetes Maternal Aunt   . Arthritis Maternal Aunt   . Colon cancer Maternal Uncle   . Diabetes  Maternal Uncle   . Colon cancer Paternal Aunt   . Diabetes Paternal Aunt   . Heart disease Paternal Aunt    Allergies as of 10/29/2018      Reactions   Meloxicam Swelling   Voltaren [diclofenac] Swelling      Medication List       Accurate as of October 29, 2018  4:32 PM. If you have any questions, ask your nurse or doctor.        atorvastatin 20 MG tablet Commonly known as: LIPITOR Take 1 tablet (20 mg total) by mouth daily.   EXCEDRIN MIGRAINE PO Take 3 tablets by mouth as needed.   gabapentin 400 MG capsule Commonly known as: NEURONTIN Take 1 capsule (400 mg total) by mouth 3 (three) times daily.   sertraline 100 MG tablet Commonly known as: ZOLOFT Take 1 tablet (100 mg total) by mouth daily.       All past medical history, surgical history, allergies, family history, immunizations andmedications were updated in the EMR today and reviewed under the history and medication portions of their EMR.     ROS: Negative, with the exception of above mentioned in HPI   Objective:  Ht 5\' 6"  (1.676 m)   Wt 200 lb (90.7 kg)   LMP 08/01/2018   BMI 32.28 kg/m  Body mass index is 32.28 kg/m. Gen: Afebrile. No acute distress. Nontoxic in appearance, well developed, well nourished.  HENT: AT. [place].  MMM Eyes:Pupils Equal Round Reactive to light, Extraocular movements intact,  Conjunctiva without redness, discharge or icterus. CV: no edema Chest: No cough or shortness of breath on exam Neuro:  Normal gait. PERLA. EOMi. Alert. Oriented x3  Psych: Normal affect, dress and demeanor. Normal speech. Normal thought content and judgment.  No exam data present No results found. No results found for this or any previous visit (from the past 24 hour(s)).  Assessment/Plan: Kristy Cooper is a 53 y.o. female present for OV for  Dizziness/Overexertion, initial encounter Suspect she overexerted herself with the exercising and changes in her diet.  Encouraged her to make sure she is  hydrating taking in at least 80 to 100 ounces a day, more if she is sweating and working out. Start incorporating more healthy carbohydrates into her diet.  This was discussed with her today on the healthy carbohydrates are and given examples. She will monitor for any acute illness symptoms and if she develops will call back and we will order covid testing for her.  Right now that does not seem to be  consistent with her symptoms. If dizziness and diaphoresis recur, she needs to be seen in person ASAP.   Reviewed expectations re: course of current medical issues.  Discussed self-management of symptoms.  Outlined signs and symptoms indicating need for more acute intervention.  Patient verbalized understanding and all questions were answered.  Patient received an After-Visit Summary.   > 15 minutes spent with patient, > 50% of that time face to face   No orders of the defined types were placed in this encounter.    Note is dictated utilizing voice recognition software. Although note has been proof read prior to signing, occasional typographical errors still can be missed. If any questions arise, please do not hesitate to call for verification.   electronically signed by:  Howard Pouch, DO  [place]

## 2018-10-30 ENCOUNTER — Encounter: Payer: Self-pay | Admitting: Family Medicine

## 2018-12-24 ENCOUNTER — Encounter: Payer: Self-pay | Admitting: Family Medicine

## 2018-12-24 ENCOUNTER — Other Ambulatory Visit: Payer: Self-pay

## 2018-12-24 ENCOUNTER — Ambulatory Visit (INDEPENDENT_AMBULATORY_CARE_PROVIDER_SITE_OTHER): Payer: BC Managed Care – PPO | Admitting: Family Medicine

## 2018-12-24 VITALS — BP 106/69 | HR 77 | Temp 98.2°F | Resp 17 | Ht 66.0 in | Wt 205.0 lb

## 2018-12-24 DIAGNOSIS — M542 Cervicalgia: Secondary | ICD-10-CM

## 2018-12-24 DIAGNOSIS — Z23 Encounter for immunization: Secondary | ICD-10-CM | POA: Diagnosis not present

## 2018-12-24 DIAGNOSIS — M26622 Arthralgia of left temporomandibular joint: Secondary | ICD-10-CM | POA: Diagnosis not present

## 2018-12-24 HISTORY — DX: Arthralgia of left temporomandibular joint: M26.622

## 2018-12-24 MED ORDER — CYCLOBENZAPRINE HCL 5 MG PO TABS: 5.0000 mg | ORAL_TABLET | Freq: Three times a day (TID) | ORAL | 1 refills | Status: DC | PRN

## 2018-12-24 MED ORDER — ONDANSETRON HCL 4 MG PO TABS: 4.0000 mg | ORAL_TABLET | Freq: Three times a day (TID) | ORAL | 0 refills | Status: DC | PRN

## 2018-12-24 MED ORDER — METHYLPREDNISOLONE ACETATE 40 MG/ML IJ SUSP
40.0000 mg | Freq: Once | INTRAMUSCULAR | Status: AC
  Administered 2018-12-24: 80 mg via INTRAMUSCULAR

## 2018-12-24 NOTE — Patient Instructions (Signed)
zofran prescribed for nausea prior to plane ride.  I think your jaw/neck discomfort may be from TMJ. Steroid injection today and flexeril/muscle relaxer prescribed.  If not improved in 4 weeks follow up   Temporomandibular Joint Syndrome  Temporomandibular joint syndrome (TMJ syndrome) is a condition that causes pain in the temporomandibular joints. These joints are located near your ears and allow your jaw to open and close. For people with TMJ syndrome, chewing, biting, or other movements of the jaw can be difficult or painful. TMJ syndrome is often mild and goes away within a few weeks. However, sometimes the condition becomes a long-term (chronic) problem. What are the causes? This condition may be caused by:  Grinding your teeth or clenching your jaw. Some people do this when they are under stress.  Arthritis.  Injury to the jaw.  Head or neck injury.  Teeth or dentures that are not aligned well. In some cases, the cause of TMJ syndrome may not be known. What are the signs or symptoms? The most common symptom of this condition is an aching pain on the side of the head in the area of the TMJ. Other symptoms may include:  Pain when moving your jaw, such as when chewing or biting.  Being unable to open your jaw all the way.  Making a clicking sound when you open your mouth.  Headache.  Earache.  Neck or shoulder pain. How is this diagnosed? This condition may be diagnosed based on:  Your symptoms and medical history.  A physical exam. Your health care provider may check the range of motion of your jaw.  Imaging tests, such as X-rays or an MRI. You may also need to see your dentist, who will determine if your teeth and jaw are lined up correctly. How is this treated? TMJ syndrome often goes away on its own. If treatment is needed, the options may include:  Eating soft foods and applying ice or heat.  Medicines to relieve pain or inflammation.  Medicines or massage  to relax the muscles.  A splint, bite plate, or mouthpiece to prevent teeth grinding or jaw clenching.  Relaxation techniques or counseling to help reduce stress.  A therapy for pain in which an electrical current is applied to the nerves through the skin (transcutaneous electrical nerve stimulation).  Acupuncture. This is sometimes helpful to relieve pain.  Jaw surgery. This is rarely needed. Follow these instructions at home:  Eating and drinking  Eat a soft diet if you are having trouble chewing.  Avoid foods that require a lot of chewing. Do not chew gum. General instructions  Take over-the-counter and prescription medicines only as told by your health care provider.  If directed, put ice on the painful area. ? Put ice in a plastic bag. ? Place a towel between your skin and the bag. ? Leave the ice on for 20 minutes, 2-3 times a day.  Apply a warm, wet cloth (warm compress) to the painful area as directed.  Massage your jaw area and do any jaw stretching exercises as told by your health care provider.  If you were given a splint, bite plate, or mouthpiece, wear it as told by your health care provider.  Keep all follow-up visits as told by your health care provider. This is important. Contact a health care provider if:  You are having trouble eating.  You have new or worsening symptoms. Get help right away if:  Your jaw locks open or closed. Summary  Temporomandibular  joint syndrome (TMJ syndrome) is a condition that causes pain in the temporomandibular joints. These joints are located near your ears and allow your jaw to open and close.  TMJ syndrome is often mild and goes away within a few weeks. However, sometimes the condition becomes a long-term (chronic) problem.  Symptoms include an aching pain on the side of the head in the area of the TMJ, pain when chewing or biting, and being unable to open your jaw all the way. You may also make a clicking sound when you  open your mouth.  TMJ syndrome often goes away on its own. If treatment is needed, it may include medicines to relieve pain, reduce inflammation, or relax the muscles. A splint, bite plate, or mouthpiece may also be used to prevent teeth grinding or jaw clenching. This information is not intended to replace advice given to you by your health care provider. Make sure you discuss any questions you have with your health care provider. Document Released: 12/12/2000 Document Revised: 05/31/2017 Document Reviewed: 04/30/2017 Elsevier Patient Education  2020 [employer].

## 2018-12-24 NOTE — Progress Notes (Signed)
Kristy Cooper , [birth year], 53 y.o., female MRN: [id number] Patient Care Team    Relationship Specialty Notifications Start End  [name], DO PCP - General Family Medicine  11/02/15     Chief Complaint  Patient presents with  . Neck Pain    left sided neck pain x1 week. Goes from ear to lymph node. Denies fever. No draining from ear but does sometimes feel clogged. .      Subjective: Pt presents for an OV with complaints of left-sided ear and neck pain of 1 week duration.  Associated symptoms include pain with chewing.  Patient reports she has pain that seems to start just in front of her ear shoots down her neck.  This can occur at any time of the day, occurs a couple times a day, as a sharp pain that can last about 10 seconds.  She has noticed discomfort with chewing located in her jaw and not her teeth.  She does feel like there may be a clog in her ear but no hearing change or ringing of her ears.  She admits she does grind her teeth.  She denies fever, chills, nausea, headache or vomit.  She has had occasional dizziness, not related to the pain.  Depression screen [facility] 2/9 05/12/2018 11/08/2017 05/17/2017 11/22/2016 07/30/2016  Decreased Interest 0 0 0 0 0  Down, Depressed, Hopeless 0 0 0 0 0  PHQ - 2 Score 0 0 0 0 0  Altered sleeping 0 0 1 2 -  Tired, decreased energy 0 0 1 2 -  Change in appetite 0 0 0 0 -  Feeling bad or failure about yourself  1 0 0 0 -  Trouble concentrating 0 0 0 0 -  Moving slowly or fidgety/restless 0 0 0 2 -  Suicidal thoughts 0 0 0 0 -  PHQ-9 Score 1 0 2 6 -  Difficult doing work/chores Not difficult at all Not difficult at all - Not difficult at all -    Allergies  Allergen Reactions  . Meloxicam Swelling  . Voltaren [Diclofenac] Swelling   Social History   Social History Narrative   Married to [place]. 2 chilrn Bubba Hales)- special needs and an adult child. She also has a grandchild.    HS grad. Artist.   Drinks caffeine. Takes daily  vitamin.   Wears seatbelt. Wears bicycle helmet. Smoke detector in the home.    Exercises routinely.    Feels safe in relationships.           Past Medical History:  Diagnosis Date  . Bilateral foot pain    chronic- told arthritis and was prescribed tramadol.   . Colon polyp 2014   tubular adenoma  . Depression   . Depression with anxiety   . GERD (gastroesophageal reflux disease)   . Hyperlipidemia LDL goal <130   . Obesity   . Prediabetes   . Right rotator cuff tendinitis 2013   Past Surgical History:  Procedure Laterality Date  . CESAREAN SECTION     Family History  Problem Relation Age of Onset  . Breast cancer Mother   . Diabetes Mother   . Mental illness Mother   . Heart disease Mother   . Arthritis Mother   . Arthritis Father   . Mental illness Father   . Breast cancer Maternal Aunt   . Diabetes Maternal Aunt   . Arthritis Maternal Aunt   . Colon cancer Maternal Uncle   . Diabetes Maternal  Uncle   . Colon cancer Paternal Aunt   . Diabetes Paternal Aunt   . Heart disease Paternal Aunt    Allergies as of 12/24/2018      Reactions   Meloxicam Swelling   Voltaren [diclofenac] Swelling      Medication List       Accurate as of December 24, 2018 11:34 AM. If you have any questions, ask your nurse or doctor.        atorvastatin 20 MG tablet Commonly known as: LIPITOR Take 1 tablet (20 mg total) by mouth daily.   EXCEDRIN MIGRAINE PO Take 3 tablets by mouth as needed.   gabapentin 400 MG capsule Commonly known as: NEURONTIN Take 1 capsule (400 mg total) by mouth 3 (three) times daily.   sertraline 100 MG tablet Commonly known as: ZOLOFT Take 1 tablet (100 mg total) by mouth daily.       All past medical history, surgical history, allergies, family history, immunizations andmedications were updated in the EMR today and reviewed under the history and medication portions of their EMR.     ROS: Negative, with the exception of above mentioned in  HPI   Objective:  BP 106/69 (BP Location: Left Arm, Patient Position: Sitting, Cuff Size: Normal)   Pulse 77   Temp 98.2 F (36.8 C) (Temporal)   Resp 17   Ht 5\' 6"  (1.676 m)   Wt 205 lb (93 kg)   LMP 10/01/2018 (Approximate)   SpO2 96%   BMI 33.09 kg/m  Body mass index is 33.09 kg/m. Gen: Afebrile. No acute distress. Nontoxic in appearance, well developed, well nourished.  HENT: AT. [place]. Bilateral TM visualized without erythema, mild fluid behind TM present bilaterally.  Minimal cerumen in the external auditory canal on the left. MMM, no oral lesions. Bilateral nares without erythema, swelling or drainage. Throat without erythema or exudates.  No cough.  No shortness of breath.  No clicking or popping of TMJ joints with jaw extension.  Mild tenderness to palpation left TMJ joint. Eyes:Pupils Equal Round Reactive to light, Extraocular movements intact,  Conjunctiva without redness, discharge or icterus. Neck/lymp/endocrine: Supple, no lymphadenopathy.  No tenderness to palpation. CV: RRR no murmur, no edema Chest: CTAB, no wheeze or crackles. Good air movement, normal resp effort.  Skin: No rashes, purpura or petechiae.  Neuro:  Normal gait. PERLA. EOMi. Alert. Oriented x3  Psych: Normal affect, dress and demeanor. Normal speech. Normal thought content and judgment.  No exam data present No results found. No results found for this or any previous visit (from the past 24 hour(s)).  Assessment/Plan: Kristy Cooper is a 53 y.o. female present for OV for  Need for influenza vaccination - Flu Vaccine QUAD 36+ mos IM  Arthralgia of left temporomandibular joint/Neck pain -Her ear exam was normal with the exception of very minimal amount of cerumen in the E AC.  She has noticed discomfort when chewing and she is tender over the TMJ joint on the left today.  She is a teeth grinder. -Suspect her symptoms are likely coming from her teeth grinding and TMJ arthralgia that is radiating to  the ear and neck. Discussed options with her today and patient agreed to have IM Depo-Medrol injection to decrease inflammation, she is unable to tolerate NSAIDs and would like to avoid steroid taper.  Also start Flexeril, nightly-can use up to 3 times daily as needed with caution on sedation potential. - methylPREDNISolone acetate (DEPO-MEDROL) injection 80 mg -TMJ education was provided to  her today, avoid chewy meals for the next few weeks.  Follow-up in 2 to 4 weeks if needed only, sooner if worsening    Reviewed expectations re: course of current medical issues.  Discussed self-management of symptoms.  Outlined signs and symptoms indicating need for more acute intervention.  Patient verbalized understanding and all questions were answered.  Patient received an After-Visit Summary.    Orders Placed This Encounter  Procedures  . Flu Vaccine QUAD 36+ mos IM     Note is dictated utilizing voice recognition software. Although note has been proof read prior to signing, occasional typographical errors still can be missed. If any questions arise, please do not hesitate to call for verification.   electronically signed by:  Howard Pouch, DO  [place]

## 2019-02-04 ENCOUNTER — Other Ambulatory Visit: Payer: Self-pay

## 2019-02-04 ENCOUNTER — Encounter: Payer: Self-pay | Admitting: Family Medicine

## 2019-02-04 ENCOUNTER — Ambulatory Visit (INDEPENDENT_AMBULATORY_CARE_PROVIDER_SITE_OTHER): Payer: BC Managed Care – PPO | Admitting: Family Medicine

## 2019-02-04 VITALS — BP 125/83 | HR 97 | Temp 97.9°F | Resp 17 | Ht 66.0 in | Wt 206.4 lb

## 2019-02-04 DIAGNOSIS — F4323 Adjustment disorder with mixed anxiety and depressed mood: Secondary | ICD-10-CM | POA: Diagnosis not present

## 2019-02-04 DIAGNOSIS — M5416 Radiculopathy, lumbar region: Secondary | ICD-10-CM | POA: Diagnosis not present

## 2019-02-04 DIAGNOSIS — R7303 Prediabetes: Secondary | ICD-10-CM

## 2019-02-04 DIAGNOSIS — E782 Mixed hyperlipidemia: Secondary | ICD-10-CM

## 2019-02-04 LAB — POCT GLYCOSYLATED HEMOGLOBIN (HGB A1C)
HbA1c POC (<> result, manual entry): 6.3 % (ref 4.0–5.6)
HbA1c, POC (controlled diabetic range): 6.3 % (ref 0.0–7.0)
HbA1c, POC (prediabetic range): 6.3 % (ref 5.7–6.4)
Hemoglobin A1C: 6.3 % — AB (ref 4.0–5.6)

## 2019-02-04 MED ORDER — SERTRALINE HCL 100 MG PO TABS: 100.0000 mg | ORAL_TABLET | Freq: Every day | ORAL | 1 refills | Status: DC

## 2019-02-04 MED ORDER — ATORVASTATIN CALCIUM 20 MG PO TABS: 20.0000 mg | ORAL_TABLET | Freq: Every day | ORAL | 3 refills | Status: DC

## 2019-02-04 MED ORDER — GABAPENTIN 400 MG PO CAPS: 400.0000 mg | ORAL_CAPSULE | Freq: Three times a day (TID) | ORAL | 1 refills | Status: DC

## 2019-02-04 NOTE — Patient Instructions (Signed)
We will call with lab result once received.  Great to see you today.  They will call you to schedule follow up in 6 months.

## 2019-02-04 NOTE — Progress Notes (Signed)
SUBJECTIVE Chief Complaint  Patient presents with  . Anxiety    Needs refills on medications     HPI:  Adjustment disorder with mixed anxiety and depressed mood Patient presents for follow-up on anxiety and depression today. She reports the zoloft 100 mg Qd is working great for her.  No complaints.  Low back pain/ DDD (degenerative disc disease),/ Lumbar radiculopathy: Patient is doing well on gabapentin 400 mg 3 times a day.   No side effects.  Needs refills today.  Prediabetes/obesity Last A1c 6.3.  Patient has tried to watch her diet and exercise.    She had been doing really great with her diet and exercise regimen, and admits she has backed off on both her diet and exercise changes.    Hyperlipidemia, unspecified hyperlipidemia type/ Hypertriglyceridemia Patient has a history of hyperlipidemia and hypertriglyceridemia.  Her last lipid panel 08/2018 improved after starting statin.  She is tolerating well.  No side effects.  Prior note This is a new problem for patient. She states she has had right-sided low back pain for about one month. There was no increase in activity, lifting or known injury acutely. She states she did have an injury when she was a young child, which did not need any medical intervention, but nothing recent. However over the last month she started with right sided lower back pain, that radiated into her buttock and now is radiating down to her right foot. She reports tingling sensation intermittently in her right foot. She denies any weakness. She denies any bladder or bowel changes.   Depression screen [facility] 2/9 02/04/2019 05/12/2018 11/08/2017 05/17/2017 11/22/2016  Decreased Interest 0 0 0 0 0  Down, Depressed, Hopeless 0 0 0 0 0  PHQ - 2 Score 0 0 0 0 0  Altered sleeping 0 0 0 1 2  Tired, decreased energy 0 0 0 1 2  Change in appetite 0 0 0 0 0  Feeling bad or failure about yourself  0 1 0 0 0  Trouble concentrating 0 0 0 0 0  Moving slowly or  fidgety/restless 0 0 0 0 2  Suicidal thoughts 0 0 0 0 0  PHQ-9 Score 0 1 0 2 6  Difficult doing work/chores Not difficult at all Not difficult at all Not difficult at all - Not difficult at all   GAD 7 : Generalized Anxiety Score 02/04/2019 08/01/2018 05/12/2018 05/17/2017  Nervous, Anxious, on Edge 0 1 0 0  Control/stop worrying 0 0 0 1  Worry too much - different things 0 1 0 1  Trouble relaxing 0 0 0 0  Restless 0 0 0 0  Easily annoyed or irritable 0 0 0 0  Afraid - awful might happen 0 1 0 1  Total GAD 7 Score 0 3 0 3  Anxiety Difficulty Not difficult at all Not difficult at all Not difficult at all -    ROS: See pertinent positives and negatives per HPI.  Patient Active Problem List   Diagnosis Date Noted  . Arthralgia of left temporomandibular joint 12/24/2018  . DDD (degenerative disc disease), lumbar 09/03/2016  . Lumbar radiculopathy 09/03/2016  . Adjustment disorder with mixed anxiety and depressed mood 07/30/2016  . Irregular menstrual cycle 03/14/2016  . Hyperlipidemia 11/23/2015  . Hypertriglyceridemia 11/23/2015  . Prediabetes 11/02/2015  . Abnormal facial hair 11/02/2015  . History of colon polyps 11/02/2015  . Obesity (BMI 30-39.9) 11/02/2015    Social History   Tobacco Use  .  Smoking status: Former Research scientist (life sciences)  . Smokeless tobacco: Never Used  Substance Use Topics  . Alcohol use: No    Current Outpatient Medications:  .  Aspirin-Acetaminophen-Caffeine (EXCEDRIN MIGRAINE PO), Take 3 tablets by mouth as needed., Disp: , Rfl:  .  atorvastatin (LIPITOR) 20 MG tablet, Take 1 tablet (20 mg total) by mouth daily., Disp: 90 tablet, Rfl: 3 .  cyclobenzaprine (FLEXERIL) 5 MG tablet, Take 1 tablet (5 mg total) by mouth 3 (three) times daily as needed for muscle spasms., Disp: 30 tablet, Rfl: 1 .  gabapentin (NEURONTIN) 400 MG capsule, Take 1 capsule (400 mg total) by mouth 3 (three) times daily., Disp: 270 capsule, Rfl: 1 .  ondansetron (ZOFRAN) 4 MG tablet, Take 1 tablet  (4 mg total) by mouth every 8 (eight) hours as needed for nausea or vomiting., Disp: 20 tablet, Rfl: 0 .  sertraline (ZOLOFT) 100 MG tablet, Take 1 tablet (100 mg total) by mouth daily., Disp: 90 tablet, Rfl: 1  Allergies  Allergen Reactions  . Meloxicam Swelling  . Voltaren [Diclofenac] Swelling    OBJECTIVE: BP 125/83 (BP Location: Right Arm, Patient Position: Sitting, Cuff Size: Normal)   Pulse 97   Temp 97.9 F (36.6 C) (Temporal)   Resp 17   Ht 5\' 6"  (1.676 m)   Wt 206 lb 6 oz (93.6 kg)   LMP 10/01/2018 (Approximate)   SpO2 97%   BMI 33.31 kg/m  Gen: Afebrile. No acute distress.  Nontoxic in appearance, well-developed, well-nourished, obese, very pleasant female. HENT: AT. [place].  No cough or hoarseness. Eyes:Pupils Equal Round Reactive to light, Extraocular movements intact,  Conjunctiva without redness, discharge or icterus. CV: RRR no murmur, no edema, +2/4 P posterior tibialis pulses Chest: CTAB, no wheeze or crackles Neuro:  Normal gait. PERLA. EOMi. Alert. Oriented x3  Psych: Normal affect, dress and demeanor. Normal speech. Normal thought content and judgment.    ASSESSMENT AND PLAN: Kristy Cooper is a 53 y.o. female present for  Adjustment disorder with mixed anxiety and depressed mood -Stable refills provided today on Zoloft. - Follow-up in 6 months, sooner if needed.  Lumbar radiculopathy -Stable refills provided today on gabapentin 400 mg 3 times daily.  - f/u 6 mos unless needed sooner.   Prediabetes/obesity -Strongly encouraged her to get back into the diet and exercise routine.  She has gained back some of her weight today. Consider metformin start if appropriate-she still would like to wait on this. A1c today is stable at 6.3 again.  Hyperlipidemia, unspecified hyperlipidemia type/ Hypertriglyceridemia - tolerating statin Fhx HD present in mother.  - diet and exercise.  -Continue atorvastatin 20 mg daily.  F/u 6 mos   > 25 minutes spent with  patient, >50% of time spent face to face    Howard Pouch, DO 02/04/2019

## 2019-06-25 ENCOUNTER — Ambulatory Visit: Payer: BC Managed Care – PPO | Attending: Internal Medicine

## 2019-06-25 DIAGNOSIS — Z23 Encounter for immunization: Secondary | ICD-10-CM

## 2019-06-25 NOTE — Progress Notes (Signed)
   Covid-19 Vaccination Clinic  Name:  Loretha Crombie    MRN: [id number] DOB: 1965/06/23  06/25/2019  Ms. Kroh was observed post Covid-19 immunization for 30 minutes based on pre-vaccination screening without incident. She was provided with Vaccine Information Sheet and instruction to access the V-Safe system.   Ms. Boelens was instructed to call 911 with any severe reactions post vaccine: Marland Kitchen Difficulty breathing  . Swelling of face and throat  . A fast heartbeat  . A bad rash all over body  . Dizziness and weakness   Immunizations Administered    Name Date Dose VIS Date Route   Pfizer COVID-19 Vaccine 06/25/2019  2:31 PM 0.3 mL 03/13/2019 Intramuscular   Manufacturer: [place]   Lot: [id number]   [place]: [id number]

## 2019-06-29 DIAGNOSIS — Z01419 Encounter for gynecological examination (general) (routine) without abnormal findings: Secondary | ICD-10-CM | POA: Diagnosis not present

## 2019-06-29 DIAGNOSIS — Z6832 Body mass index (BMI) 32.0-32.9, adult: Secondary | ICD-10-CM | POA: Diagnosis not present

## 2019-06-29 DIAGNOSIS — N760 Acute vaginitis: Secondary | ICD-10-CM | POA: Diagnosis not present

## 2019-07-20 ENCOUNTER — Ambulatory Visit: Payer: BC Managed Care – PPO | Attending: Internal Medicine

## 2019-07-20 DIAGNOSIS — Z23 Encounter for immunization: Secondary | ICD-10-CM

## 2019-07-20 NOTE — Progress Notes (Signed)
   Covid-19 Vaccination Clinic  Name:  Kristy Cooper    MRN: [id number] DOB: 1965/05/30  07/20/2019  Kristy Cooper was observed post Covid-19 immunization for 15 minutes without incident. She was provided with Vaccine Information Sheet and instruction to access the V-Safe system.   Kristy Cooper was instructed to call 911 with any severe reactions post vaccine: Marland Kitchen Difficulty breathing  . Swelling of face and throat  . A fast heartbeat  . A bad rash all over body  . Dizziness and weakness   Immunizations Administered    Name Date Dose VIS Date Route   Pfizer COVID-19 Vaccine 07/20/2019  1:23 PM 0.3 mL 05/27/2018 Intramuscular   Manufacturer: [place]   Lot: [id number]   [place]: [id number]

## 2019-07-29 ENCOUNTER — Other Ambulatory Visit: Payer: Self-pay | Admitting: Family Medicine

## 2019-07-29 DIAGNOSIS — F4323 Adjustment disorder with mixed anxiety and depressed mood: Secondary | ICD-10-CM

## 2019-07-29 DIAGNOSIS — M5416 Radiculopathy, lumbar region: Secondary | ICD-10-CM

## 2019-08-10 ENCOUNTER — Ambulatory Visit (INDEPENDENT_AMBULATORY_CARE_PROVIDER_SITE_OTHER): Payer: BC Managed Care – PPO | Admitting: Family Medicine

## 2019-08-10 ENCOUNTER — Other Ambulatory Visit: Payer: Self-pay

## 2019-08-10 ENCOUNTER — Encounter: Payer: Self-pay | Admitting: Family Medicine

## 2019-08-10 VITALS — BP 129/85 | HR 75 | Temp 98.3°F | Resp 17 | Ht 66.0 in | Wt 201.5 lb

## 2019-08-10 DIAGNOSIS — R7303 Prediabetes: Secondary | ICD-10-CM

## 2019-08-10 DIAGNOSIS — E781 Pure hyperglyceridemia: Secondary | ICD-10-CM | POA: Diagnosis not present

## 2019-08-10 DIAGNOSIS — M5416 Radiculopathy, lumbar region: Secondary | ICD-10-CM

## 2019-08-10 DIAGNOSIS — F4323 Adjustment disorder with mixed anxiety and depressed mood: Secondary | ICD-10-CM

## 2019-08-10 DIAGNOSIS — E782 Mixed hyperlipidemia: Secondary | ICD-10-CM | POA: Diagnosis not present

## 2019-08-10 DIAGNOSIS — E669 Obesity, unspecified: Secondary | ICD-10-CM

## 2019-08-10 DIAGNOSIS — M5136 Other intervertebral disc degeneration, lumbar region: Secondary | ICD-10-CM | POA: Diagnosis not present

## 2019-08-10 DIAGNOSIS — M51369 Other intervertebral disc degeneration, lumbar region without mention of lumbar back pain or lower extremity pain: Secondary | ICD-10-CM

## 2019-08-10 MED ORDER — ATORVASTATIN CALCIUM 20 MG PO TABS: 20.0000 mg | ORAL_TABLET | Freq: Every day | ORAL | 3 refills | Status: DC

## 2019-08-10 MED ORDER — GABAPENTIN 400 MG PO CAPS: 400.0000 mg | ORAL_CAPSULE | Freq: Three times a day (TID) | ORAL | 1 refills | Status: DC

## 2019-08-10 MED ORDER — SERTRALINE HCL 100 MG PO TABS: 100.0000 mg | ORAL_TABLET | Freq: Every day | ORAL | 1 refills | Status: DC

## 2019-08-10 NOTE — Progress Notes (Signed)
SUBJECTIVE Chief Complaint  Patient presents with  . Hyperlipidemia    Needs refills on medications   . Anxiety  . Depression    HPI: Kristy Cooper is a  54 y.o. female.  Adjustment disorder with mixed anxiety and depressed mood Patient presents for follow-up on anxiety and depression today. She reports she is doing well on the zoloft 100 mg Qd.  Prediabetes/obesity Last A1c 6.3.  Patient has tried to watch her diet and exercise.    She had been doing really great with her diet and exercise regimen, and admits she has backed off on both her diet and exercise changes.  Last weight 206. She has lost another 5 lbs.   Hyperlipidemia, unspecified hyperlipidemia type/ Hypertriglyceridemia Patient has a history of hyperlipidemia and hypertriglyceridemia.  she has tolerated statin. She is due for repeat today.    Low back pain/ DDD (degenerative disc disease),/ Lumbar radiculopathy: Patient is doing well  on gabapentin 400 mg 3 times a day.   No side effects.  Prior note This is a new problem for patient. She states she has had right-sided low back pain for about one month. There was no increase in activity, lifting or known injury acutely. She states she did have an injury when she was a young child, which did not need any medical intervention, but nothing recent. However over the last month she started with right sided lower back pain, that radiated into her buttock and now is radiating down to her right foot. She reports tingling sensation intermittently in her right foot. She denies any weakness. She denies any bladder or bowel changes.   Depression screen [facility] 2/9 08/10/2019 02/04/2019 05/12/2018 11/08/2017 05/17/2017  Decreased Interest 0 0 0 0 0  Down, Depressed, Hopeless 0 0 0 0 0  PHQ - 2 Score 0 0 0 0 0  Altered sleeping 3 0 0 0 1  Tired, decreased energy 0 0 0 0 1  Change in appetite 0 0 0 0 0  Feeling bad or failure about yourself  0 0 1 0 0  Trouble concentrating 0 0 0 0 0    Moving slowly or fidgety/restless 0 0 0 0 0  Suicidal thoughts 0 0 0 0 0  PHQ-9 Score 3 0 1 0 2  Difficult doing work/chores Not difficult at all Not difficult at all Not difficult at all Not difficult at all -   GAD 7 : Generalized Anxiety Score 08/10/2019 02/04/2019 08/01/2018 05/12/2018  Nervous, Anxious, on Edge 0 0 1 0  Control/stop worrying 3 0 0 0  Worry too much - different things 3 0 1 0  Trouble relaxing 0 0 0 0  Restless 0 0 0 0  Easily annoyed or irritable 0 0 0 0  Afraid - awful might happen 0 0 1 0  Total GAD 7 Score 6 0 3 0  Anxiety Difficulty Not difficult at all Not difficult at all Not difficult at all Not difficult at all    ROS: See pertinent positives and negatives per HPI.  Patient Active Problem List   Diagnosis Date Noted  . Arthralgia of left temporomandibular joint 12/24/2018  . DDD (degenerative disc disease), lumbar 09/03/2016  . Lumbar radiculopathy 09/03/2016  . Adjustment disorder with mixed anxiety and depressed mood 07/30/2016  . Irregular menstrual cycle 03/14/2016  . Hyperlipidemia 11/23/2015  . Hypertriglyceridemia 11/23/2015  . Prediabetes 11/02/2015  . Abnormal facial hair 11/02/2015  . History of colon polyps 11/02/2015  .  Obesity (BMI 30-39.9) 11/02/2015    Social History   Tobacco Use  . Smoking status: Former Research scientist (life sciences)  . Smokeless tobacco: Never Used  Substance Use Topics  . Alcohol use: No    Current Outpatient Medications:  .  Aspirin-Acetaminophen-Caffeine (EXCEDRIN MIGRAINE PO), Take 3 tablets by mouth as needed., Disp: , Rfl:  .  atorvastatin (LIPITOR) 20 MG tablet, Take 1 tablet (20 mg total) by mouth daily., Disp: 90 tablet, Rfl: 3 .  cyclobenzaprine (FLEXERIL) 5 MG tablet, Take 1 tablet (5 mg total) by mouth 3 (three) times daily as needed for muscle spasms., Disp: 30 tablet, Rfl: 1 .  gabapentin (NEURONTIN) 400 MG capsule, Take 1 capsule (400 mg total) by mouth 3 (three) times daily., Disp: 270 capsule, Rfl: 1 .  ondansetron  (ZOFRAN) 4 MG tablet, Take 1 tablet (4 mg total) by mouth every 8 (eight) hours as needed for nausea or vomiting., Disp: 20 tablet, Rfl: 0 .  sertraline (ZOLOFT) 100 MG tablet, Take 1 tablet (100 mg total) by mouth daily., Disp: 90 tablet, Rfl: 1  Allergies  Allergen Reactions  . Meloxicam Swelling  . Voltaren [Diclofenac] Swelling    OBJECTIVE: BP 129/85 (BP Location: Left Arm, Patient Position: Sitting, Cuff Size: Normal)   Pulse 75   Temp 98.3 F (36.8 C) (Temporal)   Resp 17   Ht '5\' 6"'$  (1.676 m)   Wt 201 lb 8 oz (91.4 kg)   LMP 10/01/2018 (Approximate)   SpO2 96%   BMI 32.52 kg/m  Gen: Afebrile. No acute distress. Nontoxic.  HENT: AT. [place].  Eyes:Pupils Equal Round Reactive to light, Extraocular movements intact,  Conjunctiva without redness, discharge or icterus. Neck/lymp/endocrine: Supple,no lymphadenopathy, no thyromegaly CV: RRR no murmur, no edema, +2/4 P posterior tibialis pulses Chest: CTAB, no wheeze or crackles Abd: Soft. NTND. BS present. no Masses palpated.  Skin: no rashes, purpura or petechiae.  Neuro:  Normal gait. PERLA. EOMi. Alert. Oriented x3 Psych: Normal affect, dress and demeanor. Normal speech. Normal thought content and judgment.   ASSESSMENT AND PLAN: [name] is a 54 y.o. female present for  Adjustment disorder with mixed anxiety and depressed mood -stable.  - continue  Zoloft. - Follow-up in 6 months, sooner if needed.  Lumbar radiculopathy - stable.  Continue gabapentin 400 mg 3 times daily.  - f/u 6 mos unless needed sooner.   Prediabetes/obesity -recheck today.a1c Consider metformin start if appropriate-she still would like to wait on this. Last a1c 6.3  Hyperlipidemia, unspecified hyperlipidemia type/ Hypertriglyceridemia - toleratingstatin Fhx HD present in mother.  - diet and exercise.  - cbc, cmp, tsh, lipids -continue atorvastatin 20 mg daily. F/u 6 mos   Orders Placed This Encounter  Procedures  . Comp Met  (CMET)  . CBC  . TSH  . Hemoglobin A1c  . Lipid panel   Meds ordered this encounter  Medications  . sertraline (ZOLOFT) 100 MG tablet    Sig: Take 1 tablet (100 mg total) by mouth daily.    Dispense:  90 tablet    Refill:  1  . gabapentin (NEURONTIN) 400 MG capsule    Sig: Take 1 capsule (400 mg total) by mouth 3 (three) times daily.    Dispense:  270 capsule    Refill:  1  . atorvastatin (LIPITOR) 20 MG tablet    Sig: Take 1 tablet (20 mg total) by mouth daily.    Dispense:  90 tablet    Refill:  3   Referral Orders  No referral(s) requested today     Howard Pouch, DO 08/10/2019

## 2019-08-10 NOTE — Patient Instructions (Signed)
I have refilled your medications for you.  We will call you with lab results.      Diabetes Mellitus and Exercise Exercising regularly is important for your overall health, especially when you have diabetes (diabetes mellitus). Exercising is not only about losing weight. It has many other health benefits, such as increasing muscle strength and bone density and reducing body fat and stress. This leads to improved fitness, flexibility, and endurance, all of which result in better overall health. Exercise has additional benefits for people with diabetes, including:  Reducing appetite.  Helping to lower and control blood glucose.  Lowering blood pressure.  Helping to control amounts of fatty substances (lipids) in the blood, such as cholesterol and triglycerides.  Helping the body to respond better to insulin (improving insulin sensitivity).  Reducing how much insulin the body needs.  Decreasing the risk for heart disease by: ? Lowering cholesterol and triglyceride levels. ? Increasing the levels of good cholesterol. ? Lowering blood glucose levels. What is my activity plan? Your health care provider or certified diabetes educator can help you make a plan for the type and frequency of exercise (activity plan) that works for you. Make sure that you:  Do at least 150 minutes of moderate-intensity or vigorous-intensity exercise each week. This could be brisk walking, biking, or water aerobics. ? Do stretching and strength exercises, such as yoga or weightlifting, at least 2 times a week. ? Spread out your activity over at least 3 days of the week.  Get some form of physical activity every day. ? Do not go more than 2 days in a row without some kind of physical activity. ? Avoid being inactive for more than 30 minutes at a time. Take frequent breaks to walk or stretch.  Choose a type of exercise or activity that you enjoy, and set realistic goals.  Start slowly, and gradually increase  the intensity of your exercise over time. What do I need to know about managing my diabetes?   Check your blood glucose before and after exercising. ? If your blood glucose is 240 mg/dL (13.3 mmol/L) or higher before you exercise, check your urine for ketones. If you have ketones in your urine, do not exercise until your blood glucose returns to normal. ? If your blood glucose is 100 mg/dL (5.6 mmol/L) or lower, eat a snack containing 15-20 grams of carbohydrate. Check your blood glucose 15 minutes after the snack to make sure that your level is above 100 mg/dL (5.6 mmol/L) before you start your exercise.  Know the symptoms of low blood glucose (hypoglycemia) and how to treat it. Your risk for hypoglycemia increases during and after exercise. Common symptoms of hypoglycemia can include: ? Hunger. ? Anxiety. ? Sweating and feeling clammy. ? Confusion. ? Dizziness or feeling light-headed. ? Increased heart rate or palpitations. ? Blurry vision. ? Tingling or numbness around the mouth, lips, or tongue. ? Tremors or shakes. ? Irritability.  Keep a rapid-acting carbohydrate snack available before, during, and after exercise to help prevent or treat hypoglycemia.  Avoid injecting insulin into areas of the body that are going to be exercised. For example, avoid injecting insulin into: ? The arms, when playing tennis. ? The legs, when jogging.  Keep records of your exercise habits. Doing this can help you and your health care provider adjust your diabetes management plan as needed. Write down: ? Food that you eat before and after you exercise. ? Blood glucose levels before and after you exercise. ?  The type and amount of exercise you have done. ? When your insulin is expected to peak, if you use insulin. Avoid exercising at times when your insulin is peaking.  When you start a new exercise or activity, work with your health care provider to make sure the activity is safe for you, and to  adjust your insulin, medicines, or food intake as needed.  Drink plenty of water while you exercise to prevent dehydration or heat stroke. Drink enough fluid to keep your urine clear or pale yellow. Summary  Exercising regularly is important for your overall health, especially when you have diabetes (diabetes mellitus).  Exercising has many health benefits, such as increasing muscle strength and bone density and reducing body fat and stress.  Your health care provider or certified diabetes educator can help you make a plan for the type and frequency of exercise (activity plan) that works for you.  When you start a new exercise or activity, work with your health care provider to make sure the activity is safe for you, and to adjust your insulin, medicines, or food intake as needed. This information is not intended to replace advice given to you by your health care provider. Make sure you discuss any questions you have with your health care provider. Document Revised: 10/11/2016 Document Reviewed: 08/29/2015 Elsevier Patient Education  [place].

## 2019-08-11 ENCOUNTER — Telehealth: Payer: Self-pay | Admitting: Family Medicine

## 2019-08-11 LAB — COMPREHENSIVE METABOLIC PANEL
AG Ratio: 1.6 (calc) (ref 1.0–2.5)
ALT: 20 U/L (ref 6–29)
AST: 20 U/L (ref 10–35)
Albumin: 4.7 g/dL (ref 3.6–5.1)
Alkaline phosphatase (APISO): 103 U/L (ref 37–153)
BUN: 14 mg/dL (ref 7–25)
CO2: 23 mmol/L (ref 20–32)
Calcium: 9.6 mg/dL (ref 8.6–10.4)
Chloride: 103 mmol/L (ref 98–110)
Creat: 0.69 mg/dL (ref 0.50–1.05)
Globulin: 2.9 g/dL (calc) (ref 1.9–3.7)
Glucose, Bld: 94 mg/dL (ref 65–99)
Potassium: 4.3 mmol/L (ref 3.5–5.3)
Sodium: 139 mmol/L (ref 135–146)
Total Bilirubin: 0.3 mg/dL (ref 0.2–1.2)
Total Protein: 7.6 g/dL (ref 6.1–8.1)

## 2019-08-11 LAB — HEMOGLOBIN A1C
Hgb A1c MFr Bld: 6.6 % of total Hgb — ABNORMAL HIGH (ref <5.7)
Mean Plasma Glucose: 143 (calc)
eAG (mmol/L): 7.9 (calc)

## 2019-08-11 LAB — LIPID PANEL
Cholesterol: 221 mg/dL — ABNORMAL HIGH (ref <200)
HDL: 42 mg/dL — ABNORMAL LOW (ref >=50)
LDL Cholesterol (Calc): 142 mg/dL (calc) — ABNORMAL HIGH
Non-HDL Cholesterol (Calc): 179 mg/dL (calc) — ABNORMAL HIGH (ref <130)
Total CHOL/HDL Ratio: 5.3 (calc) — ABNORMAL HIGH (ref <5.0)
Triglycerides: 229 mg/dL — ABNORMAL HIGH (ref <150)

## 2019-08-11 LAB — CBC
HCT: 43.2 % (ref 35.0–45.0)
Hemoglobin: 14.4 g/dL (ref 11.7–15.5)
MCH: 26.8 pg — ABNORMAL LOW (ref 27.0–33.0)
MCHC: 33.3 g/dL (ref 32.0–36.0)
MCV: 80.3 fL (ref 80.0–100.0)
MPV: 9.6 fL (ref 7.5–12.5)
Platelets: 360 10*3/uL (ref 140–400)
RBC: 5.38 10*6/uL — ABNORMAL HIGH (ref 3.80–5.10)
RDW: 13.7 % (ref 11.0–15.0)
WBC: 9.5 10*3/uL (ref 3.8–10.8)

## 2019-08-11 LAB — TSH: TSH: 1.8 mIU/L

## 2019-08-11 MED ORDER — ATORVASTATIN CALCIUM 40 MG PO TABS: 40.0000 mg | ORAL_TABLET | Freq: Every day | ORAL | 3 refills | Status: DC

## 2019-08-11 MED ORDER — ATORVASTATIN CALCIUM 20 MG PO TABS: 40.0000 mg | ORAL_TABLET | Freq: Every day | ORAL | 3 refills | Status: DC

## 2019-08-11 NOTE — Telephone Encounter (Signed)
Please inform patient the following information: liver, kidney, thyroid function is normal. Blood cell counts are stable from prior collections  A1c increased from 6.3 to 6.6.  Greater than 6.5 is in the diabetic range, however her glucose/sugar was 68 which is normal.  She has voiced her preference not to start medications and I think she can continue working on her diet and get back to exercising and this will come back down.  We will check again at her next appointment-if still above 6.5 at that time she should start medication at that time.  Her cholesterol panel was elevated considering she is prescribed Lipitor.  Had she been taking her Lipitor/atorvastatin?    -If she states she has been taking her Lipitor, I would increase her dose.  If she has not been taking her Lipitor I would strongly encourage her to restart.

## 2019-08-11 NOTE — Addendum Note (Signed)
Addended by: Howard Pouch A on: 08/11/2019 01:59 PM   Modules accepted: Orders

## 2019-08-11 NOTE — Telephone Encounter (Signed)
Pt was called and given lab results/instructions. She states she has been taking her Lipitor with no missed doses. She agrees to start higher dose. Pt was told this would be sent to [facility] in [place] and if there were any concerns to please let me know.

## 2019-08-28 DIAGNOSIS — Z1231 Encounter for screening mammogram for malignant neoplasm of breast: Secondary | ICD-10-CM | POA: Diagnosis not present

## 2019-09-03 ENCOUNTER — Other Ambulatory Visit: Payer: Self-pay | Admitting: Obstetrics and Gynecology

## 2019-09-03 DIAGNOSIS — R928 Other abnormal and inconclusive findings on diagnostic imaging of breast: Secondary | ICD-10-CM

## 2019-09-09 ENCOUNTER — Other Ambulatory Visit: Payer: Self-pay

## 2019-09-09 ENCOUNTER — Ambulatory Visit
Admission: RE | Admit: 2019-09-09 | Discharge: 2019-09-09 | Disposition: A | Discharge status: Home / Self Care | Payer: BC Managed Care – PPO | Source: Ambulatory Visit | Attending: Obstetrics and Gynecology | Admitting: Obstetrics and Gynecology

## 2019-09-09 ENCOUNTER — Other Ambulatory Visit: Payer: Self-pay | Admitting: Obstetrics and Gynecology

## 2019-09-09 DIAGNOSIS — R928 Other abnormal and inconclusive findings on diagnostic imaging of breast: Secondary | ICD-10-CM | POA: Diagnosis not present

## 2019-09-21 ENCOUNTER — Ambulatory Visit
Admission: RE | Admit: 2019-09-21 | Discharge: 2019-09-21 | Disposition: A | Discharge status: Home / Self Care | Payer: BC Managed Care – PPO | Source: Ambulatory Visit | Attending: Obstetrics and Gynecology | Admitting: Obstetrics and Gynecology

## 2019-09-21 ENCOUNTER — Other Ambulatory Visit: Payer: Self-pay

## 2019-09-21 ENCOUNTER — Other Ambulatory Visit: Payer: Self-pay | Admitting: Obstetrics and Gynecology

## 2019-09-21 DIAGNOSIS — R928 Other abnormal and inconclusive findings on diagnostic imaging of breast: Secondary | ICD-10-CM

## 2019-09-21 DIAGNOSIS — R921 Mammographic calcification found on diagnostic imaging of breast: Secondary | ICD-10-CM | POA: Diagnosis not present

## 2019-09-21 DIAGNOSIS — N6489 Other specified disorders of breast: Secondary | ICD-10-CM | POA: Diagnosis not present

## 2019-10-14 ENCOUNTER — Other Ambulatory Visit: Payer: Self-pay | Admitting: Surgery

## 2019-10-14 DIAGNOSIS — N6099 Unspecified benign mammary dysplasia of unspecified breast: Secondary | ICD-10-CM

## 2019-10-14 DIAGNOSIS — D493 Neoplasm of unspecified behavior of breast: Secondary | ICD-10-CM | POA: Diagnosis not present

## 2019-10-15 ENCOUNTER — Other Ambulatory Visit: Payer: Self-pay | Admitting: Surgery

## 2019-10-15 DIAGNOSIS — D493 Neoplasm of unspecified behavior of breast: Secondary | ICD-10-CM

## 2019-10-21 ENCOUNTER — Other Ambulatory Visit: Payer: Self-pay

## 2019-10-21 ENCOUNTER — Ambulatory Visit
Admission: RE | Admit: 2019-10-21 | Discharge: 2019-10-21 | Disposition: A | Discharge status: Home / Self Care | Payer: BC Managed Care – PPO | Source: Ambulatory Visit | Attending: Surgery | Admitting: Surgery

## 2019-10-21 DIAGNOSIS — D493 Neoplasm of unspecified behavior of breast: Secondary | ICD-10-CM

## 2019-10-21 DIAGNOSIS — N6489 Other specified disorders of breast: Secondary | ICD-10-CM | POA: Diagnosis not present

## 2019-10-21 MED ORDER — GADOBUTROL 1 MMOL/ML IV SOLN
9.0000 mL | Freq: Once | INTRAVENOUS | Status: AC | PRN
  Administered 2019-10-21: 9 mL via INTRAVENOUS

## 2019-10-23 ENCOUNTER — Other Ambulatory Visit: Payer: Self-pay | Admitting: Surgery

## 2019-10-23 DIAGNOSIS — N6099 Unspecified benign mammary dysplasia of unspecified breast: Secondary | ICD-10-CM

## 2019-11-02 ENCOUNTER — Other Ambulatory Visit: Payer: Self-pay | Admitting: Family Medicine

## 2019-11-03 ENCOUNTER — Telehealth: Payer: Self-pay

## 2019-11-03 ENCOUNTER — Other Ambulatory Visit: Payer: Self-pay | Admitting: *Deleted

## 2019-11-03 MED ORDER — ATORVASTATIN CALCIUM 40 MG PO TABS: 40.0000 mg | ORAL_TABLET | Freq: Every day | ORAL | 3 refills | Status: DC

## 2019-11-03 NOTE — Telephone Encounter (Signed)
Please advise on Zoloft and Gabapentin Refills.

## 2019-11-03 NOTE — Telephone Encounter (Signed)
Received refill request for patient's Lipitor, gabapentin and Zoloft. Gabapentin was refilled 08/10/2019 for 90 days plus refill Zoloft was refilled 08/10/2019 with 90 days plus a refill  She should not need refills on these medications.  He is asked her to contact her pharmacy.  Please make sure we have the correct pharmacy that we sent the medications to.

## 2019-11-03 NOTE — Telephone Encounter (Signed)
Patient refill request  atorvastatin (LIPITOR) 40 MG tablet [[id number]]   gabapentin (NEURONTIN) 400 MG capsule [[id number]]   sertraline (ZOLOFT) 100 MG tablet [[id number]]     [id number] - [place], [place] - [address] AT [place] [address]

## 2019-11-04 NOTE — Telephone Encounter (Signed)
Pt notified of refills

## 2019-11-16 ENCOUNTER — Encounter (HOSPITAL_COMMUNITY)
Admission: RE | Admit: 2019-11-16 | Discharge: 2019-11-16 | Disposition: A | Discharge status: Home / Self Care | Payer: BC Managed Care – PPO | Source: Ambulatory Visit | Attending: Surgery | Admitting: Surgery

## 2019-11-16 ENCOUNTER — Other Ambulatory Visit: Payer: Self-pay

## 2019-11-16 ENCOUNTER — Encounter (HOSPITAL_COMMUNITY): Payer: Self-pay

## 2019-11-16 DIAGNOSIS — Z87891 Personal history of nicotine dependence: Secondary | ICD-10-CM | POA: Diagnosis not present

## 2019-11-16 DIAGNOSIS — Z803 Family history of malignant neoplasm of breast: Secondary | ICD-10-CM | POA: Diagnosis not present

## 2019-11-16 DIAGNOSIS — E669 Obesity, unspecified: Secondary | ICD-10-CM | POA: Diagnosis not present

## 2019-11-16 DIAGNOSIS — Z01812 Encounter for preprocedural laboratory examination: Secondary | ICD-10-CM | POA: Insufficient documentation

## 2019-11-16 DIAGNOSIS — Z6831 Body mass index (BMI) 31.0-31.9, adult: Secondary | ICD-10-CM | POA: Diagnosis not present

## 2019-11-16 DIAGNOSIS — F418 Other specified anxiety disorders: Secondary | ICD-10-CM | POA: Diagnosis not present

## 2019-11-16 DIAGNOSIS — Z79899 Other long term (current) drug therapy: Secondary | ICD-10-CM | POA: Insufficient documentation

## 2019-11-16 DIAGNOSIS — C50912 Malignant neoplasm of unspecified site of left female breast: Secondary | ICD-10-CM | POA: Insufficient documentation

## 2019-11-16 DIAGNOSIS — R7303 Prediabetes: Secondary | ICD-10-CM | POA: Insufficient documentation

## 2019-11-16 DIAGNOSIS — K219 Gastro-esophageal reflux disease without esophagitis: Secondary | ICD-10-CM | POA: Diagnosis not present

## 2019-11-16 DIAGNOSIS — E785 Hyperlipidemia, unspecified: Secondary | ICD-10-CM | POA: Insufficient documentation

## 2019-11-16 LAB — CBC
HCT: 42.3 % (ref 36.0–46.0)
Hemoglobin: 13.5 g/dL (ref 12.0–15.0)
MCH: 26.9 pg (ref 26.0–34.0)
MCHC: 31.9 g/dL (ref 30.0–36.0)
MCV: 84.3 fL (ref 80.0–100.0)
Platelets: 326 10*3/uL (ref 150–400)
RBC: 5.02 MIL/uL (ref 3.87–5.11)
RDW: 13.9 % (ref 11.5–15.5)
WBC: 9.6 10*3/uL (ref 4.0–10.5)
nRBC: 0 % (ref 0.0–0.2)

## 2019-11-16 NOTE — Pre-Procedure Instructions (Signed)
[id number] - [place], [place] - [address] [place] [address] Phone: [phone] Fax: [phone]      Your procedure is scheduled on Wednesday August 25th.  Report to [facility] Main Entrance "A" at 9:15 A.M., and check in at the Admitting office.  Call this number if you have problems the morning of surgery:  [phone]  Call [phone] if you have any questions prior to your surgery date Monday-Friday 8am-4pm    Remember:  Do not eat after midnight the night before your surgery  You may drink clear liquids until 8:15 the morning of your surgery.   Clear liquids allowed are: Water, Non-Citrus Juices (without pulp), Carbonated Beverages, Clear Tea, Black Coffee Only, and Gatorade Patient Instructions   The night before surgery:  o No food after midnight. ONLY clear liquids after midnight   The day of surgery (if you do NOT have diabetes):  o Drink ONE (1) Pre-Surgery Clear Ensure as directed.   o This drink was given to you during your hospital  pre-op appointment visit. o The pre-op nurse will instruct you on the time to drink the  Pre-Surgery Ensure depending on your surgery time. o Finish the drink at the designated time by the pre-op nurse. - 8:15 AM day of surgery o Nothing else to drink after completing the  Pre-Surgery Clear Ensure.         If you have questions, please contact your surgeons office.     Take these medicines the morning of surgery with A SIP OF WATER  atorvastatin (LIPITOR) gabapentin (NEURONTIN) sertraline (ZOLOFT)  cyclobenzaprine (FLEXERIL) if needed  As of today, STOP taking any Aspirin (unless otherwise instructed by your surgeon) Aleve, Naproxen, Ibuprofen, Motrin, Advil, Goody's, BC's, all herbal medications, fish oil, and all vitamins.                      Do not wear jewelry, make up, or nail polish            Do not wear lotions, powders,  perfumes/colognes, or deodorant.            Do not shave 48 hours prior to surgery.  Men may shave face and neck.            Do not bring valuables to the hospital.            [facility] is not responsible for any belongings or valuables.  Do NOT Smoke (Tobacco/Vaping) or drink Alcohol 24 hours prior to your procedure If you use a CPAP at night, you may bring all equipment for your overnight stay.   Contacts, glasses, dentures or bridgework may not be worn into surgery.      For patients admitted to the hospital, discharge time will be determined by your treatment team.   Patients discharged the day of surgery will not be allowed to drive home, and someone needs to stay with them for 24 hours.    Special instructions:   [place]- Preparing For Surgery  Before surgery, you can play an important role. Because skin is not sterile, your skin needs to be as free of germs as possible. You can reduce the number of germs on your skin by washing with CHG (chlorahexidine gluconate) Soap before surgery.  CHG is an antiseptic cleaner which kills germs and bonds with the skin to continue killing germs even  after washing.    Oral Hygiene is also important to reduce your risk of infection.  Remember - BRUSH YOUR TEETH THE MORNING OF SURGERY WITH YOUR REGULAR TOOTHPASTE  Please do not use if you have an allergy to CHG or antibacterial soaps. If your skin becomes reddened/irritated stop using the CHG.  Do not shave (including legs and underarms) for at least 48 hours prior to first CHG shower. It is OK to shave your face.  Please follow these instructions carefully.   1. Shower the NIGHT BEFORE SURGERY and the MORNING OF SURGERY with CHG Soap.   2. If you chose to wash your hair, wash your hair first as usual with your normal shampoo.  3. After you shampoo, rinse your hair and body thoroughly to remove the shampoo.  4. Use CHG as you would any other liquid soap. You can apply CHG directly to the  skin and wash gently with a scrungie or a clean washcloth.   5. Apply the CHG Soap to your body ONLY FROM THE NECK DOWN.  Do not use on open wounds or open sores. Avoid contact with your eyes, ears, mouth and genitals (private parts). Wash Face and genitals (private parts)  with your normal soap.   6. Wash thoroughly, paying special attention to the area where your surgery will be performed.  7. Thoroughly rinse your body with warm water from the neck down.  8. DO NOT shower/wash with your normal soap after using and rinsing off the CHG Soap.  9. Pat yourself dry with a CLEAN TOWEL.  10. Wear CLEAN PAJAMAS to bed the night before surgery  11. Place CLEAN SHEETS on your bed the night of your first shower and DO NOT SLEEP WITH PETS.   Day of Surgery: Wear Clean/Comfortable clothing the morning of surgery Do not apply any deodorants/lotions.   Remember to brush your teeth WITH YOUR REGULAR TOOTHPASTE.   Please read over the following fact sheets that you were given.

## 2019-11-16 NOTE — Progress Notes (Signed)
PCP - Dr. Howard Pouch Cardiologist - denies  Chest x-ray - N/A EKG - N/A Stress Test - denies ECHO - denies Cardiac Cath - denies  Sleep Study - denies  Aspirin Instructions: Patient instructed to hold all Aspirin, NSAID's, herbal medications, fish oil and vitamins 7 days prior to surgery.   ERAS Protcol - clear liquids 3 hours prior to surgery PRE-SURGERY Ensure or G2- ensure given with instructions  COVID TEST- 11/23/19 at at [facility]. Pt instructed to remain in their car. Educated on Transport planner until [employer].  *Breast seed appointment 11/24/19 at at [facility]. Pt instructed to remain in their car. Educated on Transport planner until [employer].    Anesthesia review: breast seed   Patient denies shortness of breath, fever, cough and chest pain at PAT appointment   All instructions explained to the patient, with a verbal understanding of the material. Patient agrees to go over the instructions while at home for a better understanding. Patient also instructed to self quarantine after being tested for COVID-19. The opportunity to ask questions was provided.

## 2019-11-17 NOTE — Progress Notes (Signed)
Anesthesia Chart Review:  Case: [id number] Date/Time: 11/25/19 1100   Procedure: RADIOACTIVE SEED GUIDED LEFT BREAST LUMPECTOMY (Left ) - LMA   Anesthesia type: General   Pre-op diagnosis: LEFT BREAST LOBULAR NEOPLASIA   Location: [facility] OR ROOM 02 / [facility]   Surgeons: Coralie Keens, MD      DISCUSSION: Patient is a 54 year old female scheduled for the above procedure. She underwent left breast biopsy on 09/21/19 that showed flat epithelial atypia with calcifications and focal lobular neoplasia. Her mother has a history of breast cancer. The above procedure recommended.   Other history includes former smoker, hyperlipidemia, prediabetes, anxiety, depression, GERD.  BMI is consistent with obesity.  Last evaluation by PCP Dr. Raoul Pitch 08/11/19. A1c 6.6% (with glucose of 94), previously 6.3%. Patient to try lifestyle modifications (diet, exercise). Follow-up visit scheduled for 01/25/20. She denied chest pain, SOB, cough, fever at PAT RN visit.   Presurgical COVID-19 test is scheduled for 11/23/2019. RSL is scheduled for 11/24/2019.  Anesthesia team to evaluate on the day of surgery. Only CBC done with PAT labs; however, she had a completely normal CMET on 08/10/19. Given her history of pre-diabetes with A1c up to 6.6% in May, will at least get a fasting CBG on arrival.    VS: BP 125/80   Pulse 81   Temp 36.9 C   Resp 18   Ht 5\' 7"  (1.702 m)   Wt 91.4 kg   LMP 10/01/2018 (Approximate)   SpO2 97%   BMI 31.54 kg/m Reported as being postmenopausal.  PROVIDERS: Kuneff, Renee A, DO is PCP, established patient since 11/02/15.   LABS: Labs reviewed: Acceptable for surgery. CMET WNL on 08/10/19.  (all labs ordered are listed, but only abnormal results are displayed)  Labs Reviewed  CBC    IMAGES: MRI Breasts 10/21/19: IMPRESSION: - 6 mm area of mild enhancement at the biopsy site in the lower outer quadrant of the left breast. Otherwise, no additional areas of enhancement in the left breast to  suggest malignancy. - No MRI evidence of malignancy in the right breast. RECOMMENDATION: - Continued follow-up with Dr. Ninfa Linden for surgical excision biopsy-proven flat epithelial atypia and atypical lobular hyperplasia at the site of recent stereotactic biopsy left breast. - Annual screening mammogram is recommended in May 2022. - BI-RADS CATEGORY  2: Benign.   EKG: N/A   CV: Denied prior echocardiogram, stress test, cardiac cath  Past Medical History:  Diagnosis Date  . Bilateral foot pain    chronic- told arthritis and was prescribed tramadol.   . Colon polyp 2014   tubular adenoma  . Depression   . Depression with anxiety   . GERD (gastroesophageal reflux disease)   . Hyperlipidemia LDL goal <130   . Obesity   . Prediabetes   . Right rotator cuff tendinitis 2013    Past Surgical History:  Procedure Laterality Date  . CESAREAN SECTION      MEDICATIONS: . Aspirin-Acetaminophen-Caffeine (EXCEDRIN MIGRAINE PO)  . atorvastatin (LIPITOR) 40 MG tablet  . cyclobenzaprine (FLEXERIL) 5 MG tablet  . gabapentin (NEURONTIN) 400 MG capsule  . sertraline (ZOLOFT) 100 MG tablet   No current facility-administered medications for this encounter.    Myra Gianotti, PA-C Surgical Short Stay/Anesthesiology [facility] Phone [phone] [facility] Phone [phone] 11/17/2019 5:20 PM

## 2019-11-17 NOTE — Anesthesia Preprocedure Evaluation (Addendum)
Anesthesia Evaluation  Patient identified by MRN, date of birth, ID band Patient awake    Reviewed: Allergy & Precautions, NPO status , Patient's Chart, lab work & pertinent test results  Airway Mallampati: II  TM Distance: >3 FB Neck ROM: Full    Dental no notable dental hx.    Pulmonary neg pulmonary ROS, former smoker,    Pulmonary exam normal breath sounds clear to auscultation       Cardiovascular negative cardio ROS Normal cardiovascular exam Rhythm:Regular Rate:Normal     Neuro/Psych  Headaches, Anxiety Depression negative psych ROS   GI/Hepatic Neg liver ROS, GERD  ,  Endo/Other  negative endocrine ROS  Renal/GU negative Renal ROS  negative genitourinary   Musculoskeletal  (+) Arthritis , Osteoarthritis,    Abdominal (+) + obese,   Peds negative pediatric ROS (+)  Hematology negative hematology ROS (+)   Anesthesia Other Findings Breast Cancer  Reproductive/Obstetrics negative OB ROS                             Anesthesia Physical Anesthesia Plan  ASA: III  Anesthesia Plan: General   Post-op Pain Management:    Induction: Intravenous  PONV Risk Score and Plan: 3 and Ondansetron, Dexamethasone, Midazolam and Treatment may vary due to age or medical condition  Airway Management Planned: LMA  Additional Equipment:   Intra-op Plan:   Post-operative Plan: Extubation in OR  Informed Consent: I have reviewed the patients History and Physical, chart, labs and discussed the procedure including the risks, benefits and alternatives for the proposed anesthesia with the patient or authorized representative who has indicated his/her understanding and acceptance.     Dental advisory given  Plan Discussed with: CRNA  Anesthesia Plan Comments: (PAT note written by Myra Gianotti, PA-C. )       Anesthesia Quick Evaluation

## 2019-11-19 ENCOUNTER — Other Ambulatory Visit (HOSPITAL_COMMUNITY): Payer: BC Managed Care – PPO

## 2019-11-23 ENCOUNTER — Other Ambulatory Visit (HOSPITAL_COMMUNITY)
Admission: RE | Admit: 2019-11-23 | Discharge: 2019-11-23 | Disposition: A | Discharge status: Home / Self Care | Payer: BC Managed Care – PPO | Source: Ambulatory Visit | Attending: Surgery | Admitting: Surgery

## 2019-11-23 DIAGNOSIS — Z01812 Encounter for preprocedural laboratory examination: Secondary | ICD-10-CM | POA: Insufficient documentation

## 2019-11-23 DIAGNOSIS — Z20822 Contact with and (suspected) exposure to covid-19: Secondary | ICD-10-CM | POA: Insufficient documentation

## 2019-11-23 LAB — SARS CORONAVIRUS 2 (TAT 6-24 HRS): SARS Coronavirus 2: NEGATIVE

## 2019-11-24 ENCOUNTER — Encounter (HOSPITAL_BASED_OUTPATIENT_CLINIC_OR_DEPARTMENT_OTHER): Payer: Self-pay | Admitting: Surgery

## 2019-11-24 ENCOUNTER — Other Ambulatory Visit: Payer: Self-pay

## 2019-11-24 ENCOUNTER — Ambulatory Visit
Admission: RE | Admit: 2019-11-24 | Discharge: 2019-11-24 | Disposition: A | Discharge status: Home / Self Care | Payer: BC Managed Care – PPO | Source: Ambulatory Visit | Attending: Surgery | Admitting: Surgery

## 2019-11-24 DIAGNOSIS — N6092 Unspecified benign mammary dysplasia of left breast: Secondary | ICD-10-CM | POA: Diagnosis not present

## 2019-11-24 DIAGNOSIS — N6099 Unspecified benign mammary dysplasia of unspecified breast: Secondary | ICD-10-CM

## 2019-11-24 NOTE — H&P (Signed)
Kristy Cooper  Location: [facility] Patient #: [id number] DOB: [birth year] Married / Language: English / Race: White Female   History of Present Illness  The patient is a 54 year old female who presents with a complaint of Breast problems.   Chief complaint: Atypical lobular neoplasia of the left breast  This is a 54 year old female who was found to have calcifications in the lower outer quadrant left breast. She underwent a biopsy of these showing flat epithelial atypia and calcifications and focal lobular neoplasia. She has had no previous problems regarding her breasts. She denies nipple discharge. She has a family history of breast cancer in her mother in her 55s. She is otherwise healthy without complaints.   Past Surgical History Cesarean Section - Multiple  Colon Polyp Removal - Colonoscopy   Diagnostic Studies History Colonoscopy  1-5 years ago Mammogram  within last year  Allergies Ibuprofen & Diet Manage Prod *ANALGESICS - ANTI-INFLAMMATORY*  Allergies Reconciled   Medication History (Chanel Teressa Senter, CMA;  Atorvastatin Calcium (20MG  Tablet, Oral) Active. Gabapentin (400MG  Capsule, Oral) Active. Sertraline HCl (100MG  Tablet, Oral) Active. Medications Reconciled  Social History Leisure centre manager, CMA;  Alcohol use  Occasional alcohol use. Caffeine use  Coffee. Tobacco use  Former smoker.  Family History ([place], [place];  Alcohol Abuse  Father. Arthritis  Father, Mother, Sister. Breast Cancer  Family Members In General, Mother. Colon Polyps  Family Members In General, Mother. Diabetes Mellitus  Family Members In General. Migraine Headache  Daughter, Sister.  Pregnancy / Birth History Leisure centre manager, [place];  Age at menarche  19 years. Age of menopause  51-55 Contraceptive History  Oral contraceptives. Gravida  5 Maternal age  48-20 Para  2 Regular periods   Other Problems (Chanel Nolan, CMA Anxiety Disorder   Hypercholesterolemia  Migraine Headache     Review of Systems ([place]; General Not Present- Appetite Loss, Chills, Fatigue, Fever, Night Sweats, Weight Gain and Weight Loss. Skin Not Present- Change in Wart/Mole, Dryness, Hives, Jaundice, New Lesions, Non-Healing Wounds, Rash and Ulcer. HEENT Present- Seasonal Allergies, Visual Disturbances and Wears glasses/contact lenses. Not Present- Earache, Hearing Loss, Hoarseness, Nose Bleed, Oral Ulcers, Ringing in the Ears, Sinus Pain, Sore Throat and Yellow Eyes. Respiratory Not Present- Bloody sputum, Chronic Cough, Difficulty Breathing, Snoring and Wheezing. Breast Present- Breast Pain. Not Present- Breast Mass, Nipple Discharge and Skin Changes. Female Genitourinary Not Present- Frequency, Nocturia, Painful Urination, Pelvic Pain and Urgency. Musculoskeletal Not Present- Back Pain, Joint Pain, Joint Stiffness, Muscle Pain, Muscle Weakness and Swelling of Extremities. Neurological Present- Headaches, Numbness and Tingling. Not Present- Decreased Memory, Fainting, Seizures, Tremor, Trouble walking and Weakness. Psychiatric Present- Anxiety. Not Present- Bipolar, Change in Sleep Pattern, Depression, Fearful and Frequent crying. Endocrine Not Present- Cold Intolerance, Excessive Hunger, Hair Changes, Heat Intolerance, Hot flashes and New Diabetes. Hematology Not Present- Blood Thinners, Easy Bruising, Excessive bleeding, Gland problems, HIV and Persistent Infections.  Vitals  10/14/2019 1:36 PM Weight: 200 lb Height: 67in Body Surface Area: 2.02 m Body Mass Index: 31.32 kg/m  Temp.: 98.44F  Pulse: 94 (Regular)  BP: 126/84(Sitting, Left Arm, Standard)     Physical Exam  The physical exam findings are as follows: Note: She appears well physical examination  The breasts are normal in appearance with normal nipple areolar complexes. There are no palpable masses.  There is no axillary adenopathy on either  side    Assessment & Plan   LEFT BREAST LOBULAR NEOPLASIA (D49.3)  Impression: I have reviewed the  patient's mammograms, ultrasound, and pathology results and gave her a copy of the pathology results. She has atypical lobular neoplasia of the left breast. Given the pathologic finding as well as her family history of breast cancer and her age, preoperative MRI of the breast is recommended. This is to look for other areas of abnormality given the lobular diagnosis. It is then recommend she undergo a radioactive seed guided left breast lumpectomy. I discussed reasons for this with her in detail. I also discussed the surgical procedure in detail. I discussed the risks of surgery which includes but is not limited to bleeding, infection, injury to surrounding structures, the need for further procedures if invasive cancer is found, cardiopulmonary issues, postoperative recovery, etc. Surgery will be scheduled as soon as possible. This may change however given the results of the MRI which is being scheduled as well. I will call her back with the results of MRI. Postoperatively, we will least recommend referral to genetics counseling.   Addendum:  MRI was negative for any other suspicious areas in either breast

## 2019-11-25 ENCOUNTER — Encounter (HOSPITAL_BASED_OUTPATIENT_CLINIC_OR_DEPARTMENT_OTHER): Payer: Self-pay | Admitting: Surgery

## 2019-11-25 ENCOUNTER — Encounter (HOSPITAL_BASED_OUTPATIENT_CLINIC_OR_DEPARTMENT_OTHER)
Admission: RE | Disposition: A | Discharge status: Home / Self Care | Payer: Self-pay | Source: Home / Self Care | Attending: Surgery

## 2019-11-25 ENCOUNTER — Ambulatory Visit (HOSPITAL_BASED_OUTPATIENT_CLINIC_OR_DEPARTMENT_OTHER)
Admission: RE | Admit: 2019-11-25 | Discharge: 2019-11-25 | Disposition: A | Discharge status: Home / Self Care | Payer: BC Managed Care – PPO | Attending: Surgery | Admitting: Surgery

## 2019-11-25 ENCOUNTER — Ambulatory Visit
Admission: RE | Admit: 2019-11-25 | Discharge: 2019-11-25 | Disposition: A | Discharge status: Home / Self Care | Payer: BC Managed Care – PPO | Source: Ambulatory Visit | Attending: Surgery | Admitting: Surgery

## 2019-11-25 ENCOUNTER — Ambulatory Visit (HOSPITAL_BASED_OUTPATIENT_CLINIC_OR_DEPARTMENT_OTHER): Payer: BC Managed Care – PPO | Admitting: Vascular Surgery

## 2019-11-25 ENCOUNTER — Other Ambulatory Visit: Payer: Self-pay

## 2019-11-25 DIAGNOSIS — K219 Gastro-esophageal reflux disease without esophagitis: Secondary | ICD-10-CM | POA: Insufficient documentation

## 2019-11-25 DIAGNOSIS — Z811 Family history of alcohol abuse and dependence: Secondary | ICD-10-CM | POA: Insufficient documentation

## 2019-11-25 DIAGNOSIS — Z79899 Other long term (current) drug therapy: Secondary | ICD-10-CM | POA: Insufficient documentation

## 2019-11-25 DIAGNOSIS — Z833 Family history of diabetes mellitus: Secondary | ICD-10-CM | POA: Insufficient documentation

## 2019-11-25 DIAGNOSIS — Z8261 Family history of arthritis: Secondary | ICD-10-CM | POA: Insufficient documentation

## 2019-11-25 DIAGNOSIS — F329 Major depressive disorder, single episode, unspecified: Secondary | ICD-10-CM | POA: Insufficient documentation

## 2019-11-25 DIAGNOSIS — E669 Obesity, unspecified: Secondary | ICD-10-CM | POA: Insufficient documentation

## 2019-11-25 DIAGNOSIS — Z6831 Body mass index (BMI) 31.0-31.9, adult: Secondary | ICD-10-CM | POA: Insufficient documentation

## 2019-11-25 DIAGNOSIS — F418 Other specified anxiety disorders: Secondary | ICD-10-CM | POA: Diagnosis not present

## 2019-11-25 DIAGNOSIS — D493 Neoplasm of unspecified behavior of breast: Secondary | ICD-10-CM | POA: Diagnosis not present

## 2019-11-25 DIAGNOSIS — Z8371 Family history of colonic polyps: Secondary | ICD-10-CM | POA: Diagnosis not present

## 2019-11-25 DIAGNOSIS — Z86010 Personal history of colonic polyps: Secondary | ICD-10-CM | POA: Diagnosis not present

## 2019-11-25 DIAGNOSIS — E7800 Pure hypercholesterolemia, unspecified: Secondary | ICD-10-CM | POA: Insufficient documentation

## 2019-11-25 DIAGNOSIS — M1990 Unspecified osteoarthritis, unspecified site: Secondary | ICD-10-CM | POA: Diagnosis not present

## 2019-11-25 DIAGNOSIS — D4862 Neoplasm of uncertain behavior of left breast: Secondary | ICD-10-CM | POA: Diagnosis not present

## 2019-11-25 DIAGNOSIS — Z803 Family history of malignant neoplasm of breast: Secondary | ICD-10-CM | POA: Diagnosis not present

## 2019-11-25 DIAGNOSIS — Z87891 Personal history of nicotine dependence: Secondary | ICD-10-CM | POA: Insufficient documentation

## 2019-11-25 DIAGNOSIS — Z820 Family history of epilepsy and other diseases of the nervous system: Secondary | ICD-10-CM | POA: Diagnosis not present

## 2019-11-25 DIAGNOSIS — N6092 Unspecified benign mammary dysplasia of left breast: Secondary | ICD-10-CM | POA: Diagnosis not present

## 2019-11-25 DIAGNOSIS — E785 Hyperlipidemia, unspecified: Secondary | ICD-10-CM | POA: Diagnosis not present

## 2019-11-25 DIAGNOSIS — R921 Mammographic calcification found on diagnostic imaging of breast: Secondary | ICD-10-CM | POA: Diagnosis not present

## 2019-11-25 DIAGNOSIS — G43909 Migraine, unspecified, not intractable, without status migrainosus: Secondary | ICD-10-CM | POA: Insufficient documentation

## 2019-11-25 DIAGNOSIS — N6099 Unspecified benign mammary dysplasia of unspecified breast: Secondary | ICD-10-CM

## 2019-11-25 DIAGNOSIS — Z886 Allergy status to analgesic agent status: Secondary | ICD-10-CM | POA: Diagnosis not present

## 2019-11-25 DIAGNOSIS — F419 Anxiety disorder, unspecified: Secondary | ICD-10-CM | POA: Diagnosis not present

## 2019-11-25 HISTORY — DX: Unspecified lump in the left breast, unspecified quadrant: N63.20

## 2019-11-25 HISTORY — PX: BREAST LUMPECTOMY WITH RADIOACTIVE SEED LOCALIZATION: SHX6424

## 2019-11-25 SURGERY — BREAST LUMPECTOMY WITH RADIOACTIVE SEED LOCALIZATION
Anesthesia: General | Site: Breast | Laterality: Left

## 2019-11-25 MED ORDER — EPHEDRINE 5 MG/ML INJ
INTRAVENOUS | Status: AC
  Filled 2019-11-25: qty 10

## 2019-11-25 MED ORDER — OXYCODONE HCL 5 MG/5ML PO SOLN: 5.0000 mg | Freq: Once | ORAL | Status: DC | PRN

## 2019-11-25 MED ORDER — PROPOFOL 10 MG/ML IV BOLUS
INTRAVENOUS | Status: DC | PRN
  Administered 2019-11-25: 150 mg via INTRAVENOUS

## 2019-11-25 MED ORDER — HYDROMORPHONE HCL 1 MG/ML IJ SOLN: 0.2500 mg | INTRAMUSCULAR | Status: DC | PRN

## 2019-11-25 MED ORDER — OXYCODONE HCL 5 MG PO TABS: 5.0000 mg | ORAL_TABLET | Freq: Once | ORAL | Status: DC | PRN

## 2019-11-25 MED ORDER — CHLORHEXIDINE GLUCONATE CLOTH 2 % EX PADS: 6.0000 | MEDICATED_PAD | Freq: Once | CUTANEOUS | Status: DC

## 2019-11-25 MED ORDER — LACTATED RINGERS IV SOLN: INTRAVENOUS | Status: DC

## 2019-11-25 MED ORDER — MIDAZOLAM HCL 2 MG/2ML IJ SOLN
INTRAMUSCULAR | Status: AC
  Filled 2019-11-25: qty 2

## 2019-11-25 MED ORDER — MIDAZOLAM HCL 5 MG/5ML IJ SOLN
INTRAMUSCULAR | Status: DC | PRN
  Administered 2019-11-25: 2 mg via INTRAVENOUS

## 2019-11-25 MED ORDER — LIDOCAINE 2% (20 MG/ML) 5 ML SYRINGE
INTRAMUSCULAR | Status: AC
  Filled 2019-11-25: qty 5

## 2019-11-25 MED ORDER — CHLORHEXIDINE GLUCONATE 0.12 % MT SOLN: 15.0000 mL | Freq: Once | OROMUCOSAL | Status: DC

## 2019-11-25 MED ORDER — FENTANYL CITRATE (PF) 100 MCG/2ML IJ SOLN
INTRAMUSCULAR | Status: AC
  Filled 2019-11-25: qty 2

## 2019-11-25 MED ORDER — ACETAMINOPHEN 500 MG PO TABS
1000.0000 mg | ORAL_TABLET | ORAL | Status: AC
  Administered 2019-11-25: 1000 mg via ORAL

## 2019-11-25 MED ORDER — ORAL CARE MOUTH RINSE: 15.0000 mL | Freq: Once | OROMUCOSAL | Status: DC

## 2019-11-25 MED ORDER — AMISULPRIDE (ANTIEMETIC) 5 MG/2ML IV SOLN: 10.0000 mg | Freq: Once | INTRAVENOUS | Status: DC | PRN

## 2019-11-25 MED ORDER — LIDOCAINE HCL (CARDIAC) PF 100 MG/5ML IV SOSY
PREFILLED_SYRINGE | INTRAVENOUS | Status: DC | PRN
  Administered 2019-11-25: 80 mg via INTRAVENOUS

## 2019-11-25 MED ORDER — CEFAZOLIN SODIUM-DEXTROSE 2-4 GM/100ML-% IV SOLN
2.0000 g | INTRAVENOUS | Status: AC
  Administered 2019-11-25: 2 g via INTRAVENOUS

## 2019-11-25 MED ORDER — BUPIVACAINE-EPINEPHRINE 0.5% -1:200000 IJ SOLN
INTRAMUSCULAR | Status: DC | PRN
  Administered 2019-11-25: 10 mL

## 2019-11-25 MED ORDER — ENSURE PRE-SURGERY PO LIQD: 296.0000 mL | Freq: Once | ORAL | Status: DC

## 2019-11-25 MED ORDER — ACETAMINOPHEN 500 MG PO TABS
ORAL_TABLET | ORAL | Status: AC
  Filled 2019-11-25: qty 2

## 2019-11-25 MED ORDER — OXYCODONE HCL 5 MG PO TABS: 5.0000 mg | ORAL_TABLET | Freq: Four times a day (QID) | ORAL | 0 refills | Status: DC | PRN

## 2019-11-25 MED ORDER — EPHEDRINE SULFATE 50 MG/ML IJ SOLN
INTRAMUSCULAR | Status: DC | PRN
  Administered 2019-11-25: 10 mg via INTRAVENOUS

## 2019-11-25 MED ORDER — DEXAMETHASONE SODIUM PHOSPHATE 4 MG/ML IJ SOLN
INTRAMUSCULAR | Status: DC | PRN
  Administered 2019-11-25: 5 mg via INTRAVENOUS

## 2019-11-25 MED ORDER — ONDANSETRON HCL 4 MG/2ML IJ SOLN
INTRAMUSCULAR | Status: DC | PRN
  Administered 2019-11-25: 4 mg via INTRAVENOUS

## 2019-11-25 MED ORDER — PROMETHAZINE HCL 25 MG/ML IJ SOLN: 6.2500 mg | INTRAMUSCULAR | Status: DC | PRN

## 2019-11-25 MED ORDER — CEFAZOLIN SODIUM-DEXTROSE 2-4 GM/100ML-% IV SOLN
INTRAVENOUS | Status: AC
  Filled 2019-11-25: qty 100

## 2019-11-25 MED ORDER — MEPERIDINE HCL 25 MG/ML IJ SOLN: 6.2500 mg | INTRAMUSCULAR | Status: DC | PRN

## 2019-11-25 MED ORDER — ONDANSETRON HCL 4 MG/2ML IJ SOLN
INTRAMUSCULAR | Status: AC
  Filled 2019-11-25: qty 2

## 2019-11-25 MED ORDER — FENTANYL CITRATE (PF) 100 MCG/2ML IJ SOLN
INTRAMUSCULAR | Status: DC | PRN
Start: 2019-11-25 — End: 2019-11-25
  Administered 2019-11-25: 50 ug via INTRAVENOUS

## 2019-11-25 SURGICAL SUPPLY — 48 items
ADH SKN CLS APL DERMABOND .7 (GAUZE/BANDAGES/DRESSINGS) ×1
APL PRP STRL LF DISP 70% ISPRP (MISCELLANEOUS) ×1
APPLIER CLIP 9.375 MED OPEN (MISCELLANEOUS)
APR CLP MED 9.3 20 MLT OPN (MISCELLANEOUS)
BINDER BREAST 3XL (GAUZE/BANDAGES/DRESSINGS) IMPLANT
BINDER BREAST LRG (GAUZE/BANDAGES/DRESSINGS) IMPLANT
BINDER BREAST MEDIUM (GAUZE/BANDAGES/DRESSINGS) IMPLANT
BINDER BREAST XLRG (GAUZE/BANDAGES/DRESSINGS) IMPLANT
BINDER BREAST XXLRG (GAUZE/BANDAGES/DRESSINGS) IMPLANT
BLADE SURG 15 STRL LF DISP TIS (BLADE) ×1 IMPLANT
BLADE SURG 15 STRL SS (BLADE) ×2
CANISTER SUC SOCK COL 7IN (MISCELLANEOUS) IMPLANT
CANISTER SUCT 1200ML W/VALVE (MISCELLANEOUS) IMPLANT
CHLORAPREP W/TINT 26 (MISCELLANEOUS) ×2 IMPLANT
CLIP APPLIE 9.375 MED OPEN (MISCELLANEOUS) IMPLANT
COVER BACK TABLE 60X90IN (DRAPES) ×2 IMPLANT
COVER MAYO STAND STRL (DRAPES) ×2 IMPLANT
COVER PROBE W GEL 5X96 (DRAPES) ×2 IMPLANT
COVER WAND RF STERILE (DRAPES) IMPLANT
DECANTER SPIKE VIAL GLASS SM (MISCELLANEOUS) IMPLANT
DERMABOND ADVANCED (GAUZE/BANDAGES/DRESSINGS) ×1
DERMABOND ADVANCED .7 DNX12 (GAUZE/BANDAGES/DRESSINGS) ×1 IMPLANT
DRAPE LAPAROSCOPIC ABDOMINAL (DRAPES) ×2 IMPLANT
DRAPE UTILITY XL STRL (DRAPES) ×2 IMPLANT
ELECT REM PT RETURN 9FT ADLT (ELECTROSURGICAL) ×2
ELECTRODE REM PT RTRN 9FT ADLT (ELECTROSURGICAL) ×1 IMPLANT
GAUZE SPONGE 4X4 12PLY STRL LF (GAUZE/BANDAGES/DRESSINGS) IMPLANT
GLOVE SURG SIGNA 7.5 PF LTX (GLOVE) ×2 IMPLANT
GOWN STRL REUS W/ TWL LRG LVL3 (GOWN DISPOSABLE) ×1 IMPLANT
GOWN STRL REUS W/ TWL XL LVL3 (GOWN DISPOSABLE) ×1 IMPLANT
GOWN STRL REUS W/TWL LRG LVL3 (GOWN DISPOSABLE) ×2
GOWN STRL REUS W/TWL XL LVL3 (GOWN DISPOSABLE) ×2
KIT MARKER MARGIN INK (KITS) ×2 IMPLANT
NEEDLE HYPO 25X1 1.5 SAFETY (NEEDLE) ×2 IMPLANT
NS IRRIG 1000ML POUR BTL (IV SOLUTION) IMPLANT
PACK BASIN DAY SURGERY FS (CUSTOM PROCEDURE TRAY) ×2 IMPLANT
PENCIL SMOKE EVACUATOR (MISCELLANEOUS) ×2 IMPLANT
SLEEVE SCD COMPRESS KNEE MED (MISCELLANEOUS) ×2 IMPLANT
SPONGE LAP 4X18 RFD (DISPOSABLE) ×4 IMPLANT
SUT MNCRL AB 4-0 PS2 18 (SUTURE) ×2 IMPLANT
SUT SILK 2 0 SH (SUTURE) IMPLANT
SUT VIC AB 3-0 SH 27 (SUTURE) ×2
SUT VIC AB 3-0 SH 27X BRD (SUTURE) ×1 IMPLANT
SYR CONTROL 10ML LL (SYRINGE) ×2 IMPLANT
TOWEL GREEN STERILE FF (TOWEL DISPOSABLE) ×2 IMPLANT
TRAY FAXITRON CT DISP (TRAY / TRAY PROCEDURE) ×2 IMPLANT
TUBE CONNECTING 20X1/4 (TUBING) IMPLANT
YANKAUER SUCT BULB TIP NO VENT (SUCTIONS) IMPLANT

## 2019-11-25 NOTE — Op Note (Signed)
RADIOACTIVE SEED GUIDED LEFT BREAST LUMPECTOMY  Procedure Note  Kristy Cooper 11/25/2019   Pre-op Diagnosis: LEFT BREAST LOBULAR NEOPLASIA     Post-op Diagnosis: same  Procedure(s): RADIOACTIVE SEED GUIDED LEFT BREAST LUMPECTOMY  Surgeon(s): Coralie Keens, MD  Anesthesia: General  Staff:  Circulator: McDonough-Hughes, Delene Ruffini, RN Scrub Person: Carson Myrtle, RN  Estimated Blood Loss: Minimal               Specimens: sent to path  Indications: This is a 54 year old female was found to have an abnormality in the left breast on screening mammography.  Showing a biopsy of this showing a lobular neoplasia.  MRI of both her breast was otherwise unremarkable.  The decision was made to proceed with a radioactive seed guided lumpectomy  Procedure: Patient brought to operating identifies correct patient.  She is placed upon the operating table general anesthesia was induced.  Her left breast was prepped and draped in usual sterile fashion.  With the neoprobe, I located the radioactive seed in the lower outer quadrant of the breast near the areola.  I anesthetized skin at the edge of the areola with Marcaine and made a circumareolar areolar incision with a scalpel.  I then dissected inferiorly and laterally toward the radioactive seed with aid of the neoprobe.  This was quite superficial.  I performed a lumpectomy staying around the seed with the neoprobe and the cautery.  Once the specimen was removed I marked the margins with pain.  An x-ray on the specimen was performed confirming that the radioactive seed and previous biopsy marker were in the specimen.  The lumpectomy specimen was then sent to pathology for evaluation.  I closed subtenons tissue with interrupted 3-0 Vicryl sutures and closed the skin with running 4-0 Monocryl.  Dermabond was then applied.  The patient tolerated procedure well.  All the counts were correct at the end of the procedure.  The patient was then extubated in  the operating room and taken in stable condition to the recovery room.          Coralie Keens   Date: 11/25/2019  Time: 11:35 AM

## 2019-11-25 NOTE — Transfer of Care (Signed)
Immediate Anesthesia Transfer of Care Note  Patient: Kristy Cooper  Procedure(s) Performed: RADIOACTIVE SEED GUIDED LEFT BREAST LUMPECTOMY (Left Breast)  Patient Location: PACU  Anesthesia Type:General  Level of Consciousness: awake, alert  and oriented  Airway & Oxygen Therapy: Patient Spontanous Breathing and Patient connected to nasal cannula oxygen  Post-op Assessment: Report given to RN and Post -op Vital signs reviewed and stable  Post vital signs: Reviewed and stable  Last Vitals:  Vitals Value Taken Time  BP    Temp    Pulse 90 11/25/19 1141  Resp 12 11/25/19 1141  SpO2 100 % 11/25/19 1141  Vitals shown include unvalidated device data.  Last Pain:  Vitals:   11/25/19 0916  TempSrc: Oral  PainSc: 0-No pain         Complications: No complications documented.

## 2019-11-25 NOTE — Interval H&P Note (Signed)
History and Physical Interval Note: no change in H and P  11/25/2019 10:37 AM  Kristy Cooper  has presented today for surgery, with the diagnosis of LEFT BREAST LOBULAR NEOPLASIA.  The various methods of treatment have been discussed with the patient and family. After consideration of risks, benefits and other options for treatment, the patient has consented to  Procedure(s) with comments: RADIOACTIVE SEED GUIDED LEFT BREAST LUMPECTOMY (Left) - LMA as a surgical intervention.  The patient's history has been reviewed, patient examined, no change in status, stable for surgery.  I have reviewed the patient's chart and labs.  Questions were answered to the patient's satisfaction.     Coralie Keens

## 2019-11-25 NOTE — Discharge Instructions (Signed)
[place] Office Phone Number [phone]  BREAST BIOPSY/ PARTIAL MASTECTOMY: POST OP INSTRUCTIONS  Always review your discharge instruction sheet given to you by the facility where your surgery was performed.  IF YOU HAVE DISABILITY OR FAMILY LEAVE FORMS, YOU MUST BRING THEM TO THE OFFICE FOR PROCESSING.  DO NOT GIVE THEM TO YOUR DOCTOR.  1. A prescription for pain medication may be given to you upon discharge.  Take your pain medication as prescribed, if needed.  If narcotic pain medicine is not needed, then you may take acetaminophen (Tylenol) or ibuprofen (Advil) as needed. 2. Take your usually prescribed medications unless otherwise directed 3. If you need a refill on your pain medication, please contact your pharmacy.  They will contact our office to request authorization.  Prescriptions will not be filled after 5pm or on week-ends. 4. You should eat very light the first 24 hours after surgery, such as soup, crackers, pudding, etc.  Resume your normal diet the day after surgery. 5. Most patients will experience some swelling and bruising in the breast.  Ice packs and a good support bra will help.  Swelling and bruising can take several days to resolve.  6. It is common to experience some constipation if taking pain medication after surgery.  Increasing fluid intake and taking a stool softener will usually help or prevent this problem from occurring.  A mild laxative (Milk of Magnesia or Miralax) should be taken according to package directions if there are no bowel movements after 48 hours. 7. Unless discharge instructions indicate otherwise, you may remove your bandages 24-48 hours after surgery, and you may shower at that time.  You may have steri-strips (small skin tapes) in place directly over the incision.  These strips should be left on the skin for 7-10 days.  If your surgeon used skin glue on the incision, you may shower in 24 hours.  The glue will flake off over the  next 2-3 weeks.  Any sutures or staples will be removed at the office during your follow-up visit. 8. ACTIVITIES:  You may resume regular daily activities (gradually increasing) beginning the next day.  Wearing a good support bra or sports bra minimizes pain and swelling.  You may have sexual intercourse when it is comfortable. a. You may drive when you no longer are taking prescription pain medication, you can comfortably wear a seatbelt, and you can safely maneuver your car and apply brakes. b. RETURN TO WORK:  ______________________________________________________________________________________ 9. You should see your doctor in the office for a follow-up appointment approximately two weeks after your surgery.  Your doctor's nurse will typically make your follow-up appointment when she calls you with your pathology report.  Expect your pathology report 2-3 business days after your surgery.  You may call to check if you do not hear from [place] after three days. 10. OTHER INSTRUCTIONS:ok to shower starting tomorrow 11. Ice pack and tylenol also for pain 12. No vigorous activity for one week _______________________________________________________________________________________________ _____________________________________________________________________________________________________________________________________ _____________________________________________________________________________________________________________________________________ _____________________________________________________________________________________________________________________________________  WHEN TO CALL YOUR DOCTOR: 1. Fever over 101.0 2. Nausea and/or vomiting. 3. Extreme swelling or bruising. 4. Continued bleeding from incision. 5. Increased pain, redness, or drainage from the incision.  The clinic staff is available to answer your questions during regular business hours.  Please don't hesitate to call and ask  to speak to one of the nurses for clinical concerns.  If you have a medical emergency, go to the nearest emergency room or call 911.  A surgeon  from [facility] is always on call at the hospital.  For further questions, please visit [email or web address]   No Tylenol until after 3pm today.   Post Anesthesia Home Care Instructions  Activity: Get plenty of rest for the remainder of the day. A responsible individual must stay with you for 24 hours following the procedure.  For the next 24 hours, DO NOT: -Drive a car -Paediatric nurse -Drink alcoholic beverages -Take any medication unless instructed by your physician -Make any legal decisions or sign important papers.  Meals: Start with liquid foods such as gelatin or soup. Progress to regular foods as tolerated. Avoid greasy, spicy, heavy foods. If nausea and/or vomiting occur, drink only clear liquids until the nausea and/or vomiting subsides. Call your physician if vomiting continues.  Special Instructions/Symptoms: Your throat may feel dry or sore from the anesthesia or the breathing tube placed in your throat during surgery. If this causes discomfort, gargle with warm salt water. The discomfort should disappear within 24 hours.  If you had a scopolamine patch placed behind your ear for the management of post- operative nausea and/or vomiting:  1. The medication in the patch is effective for 72 hours, after which it should be removed.  Wrap patch in a tissue and discard in the trash. Wash hands thoroughly with soap and water. 2. You may remove the patch earlier than 72 hours if you experience unpleasant side effects which may include dry mouth, dizziness or visual disturbances. 3. Avoid touching the patch. Wash your hands with soap and water after contact with the patch.

## 2019-11-25 NOTE — Anesthesia Postprocedure Evaluation (Signed)
Anesthesia Post Note  Patient: Stage manager  Procedure(s) Performed: RADIOACTIVE SEED GUIDED LEFT BREAST LUMPECTOMY (Left Breast)     Patient location during evaluation: PACU Anesthesia Type: General Level of consciousness: awake and alert Pain management: pain level controlled Vital Signs Assessment: post-procedure vital signs reviewed and stable Respiratory status: spontaneous breathing, nonlabored ventilation and respiratory function stable Cardiovascular status: blood pressure returned to baseline and stable Postop Assessment: no apparent nausea or vomiting Anesthetic complications: no   No complications documented.  Last Vitals:  Vitals:   11/25/19 1143 11/25/19 1239  BP:  (!) 109/59  Pulse: 89 75  Resp: 13 18  Temp:  36.8 C  SpO2: 100% 98%    Last Pain:  Vitals:   11/25/19 1239  TempSrc:   PainSc: 0-No pain                 Lynda Rainwater

## 2019-11-25 NOTE — Anesthesia Procedure Notes (Signed)
Procedure Name: LMA Insertion Date/Time: 11/25/2019 11:08 AM Performed by: Bufford Spikes, CRNA Pre-anesthesia Checklist: Patient identified, Emergency Drugs available, Suction available and Patient being monitored Patient Re-evaluated:Patient Re-evaluated prior to induction Oxygen Delivery Method: Circle system utilized Preoxygenation: Pre-oxygenation with 100% oxygen Induction Type: IV induction Ventilation: Mask ventilation without difficulty LMA: LMA inserted LMA Size: 4.0 Number of attempts: 1 Placement Confirmation: positive ETCO2 Tube secured with: Tape Dental Injury: Teeth and Oropharynx as per pre-operative assessment

## 2019-11-26 ENCOUNTER — Encounter (HOSPITAL_BASED_OUTPATIENT_CLINIC_OR_DEPARTMENT_OTHER): Payer: Self-pay | Admitting: Surgery

## 2019-11-26 LAB — SURGICAL PATHOLOGY

## 2020-01-25 ENCOUNTER — Ambulatory Visit: Payer: BC Managed Care – PPO | Admitting: Family Medicine

## 2020-01-30 ENCOUNTER — Other Ambulatory Visit: Payer: Self-pay | Admitting: Family Medicine

## 2020-01-30 DIAGNOSIS — M5416 Radiculopathy, lumbar region: Secondary | ICD-10-CM

## 2020-01-30 DIAGNOSIS — F4323 Adjustment disorder with mixed anxiety and depressed mood: Secondary | ICD-10-CM

## 2020-02-01 ENCOUNTER — Other Ambulatory Visit: Payer: Self-pay

## 2020-02-01 ENCOUNTER — Ambulatory Visit (INDEPENDENT_AMBULATORY_CARE_PROVIDER_SITE_OTHER): Payer: BC Managed Care – PPO | Admitting: Family Medicine

## 2020-02-01 ENCOUNTER — Encounter: Payer: Self-pay | Admitting: Family Medicine

## 2020-02-01 VITALS — BP 115/75 | HR 81 | Temp 98.0°F | Ht 67.0 in | Wt 199.0 lb

## 2020-02-01 DIAGNOSIS — E669 Obesity, unspecified: Secondary | ICD-10-CM

## 2020-02-01 DIAGNOSIS — F4323 Adjustment disorder with mixed anxiety and depressed mood: Secondary | ICD-10-CM | POA: Diagnosis not present

## 2020-02-01 DIAGNOSIS — M26622 Arthralgia of left temporomandibular joint: Secondary | ICD-10-CM

## 2020-02-01 DIAGNOSIS — E119 Type 2 diabetes mellitus without complications: Secondary | ICD-10-CM | POA: Diagnosis not present

## 2020-02-01 DIAGNOSIS — Z23 Encounter for immunization: Secondary | ICD-10-CM

## 2020-02-01 DIAGNOSIS — E782 Mixed hyperlipidemia: Secondary | ICD-10-CM

## 2020-02-01 DIAGNOSIS — M5416 Radiculopathy, lumbar region: Secondary | ICD-10-CM

## 2020-02-01 DIAGNOSIS — M5136 Other intervertebral disc degeneration, lumbar region: Secondary | ICD-10-CM

## 2020-02-01 LAB — POCT GLYCOSYLATED HEMOGLOBIN (HGB A1C)
HbA1c POC (<> result, manual entry): 6.6 % (ref 4.0–5.6)
HbA1c, POC (controlled diabetic range): 6.6 % (ref 0.0–7.0)
HbA1c, POC (prediabetic range): 6.6 % — AB (ref 5.7–6.4)
Hemoglobin A1C: 6.6 % — AB (ref 4.0–5.6)

## 2020-02-01 MED ORDER — SERTRALINE HCL 100 MG PO TABS: 100.0000 mg | ORAL_TABLET | Freq: Every day | ORAL | 1 refills | Status: DC

## 2020-02-01 MED ORDER — METFORMIN HCL 500 MG PO TABS: 500.0000 mg | ORAL_TABLET | Freq: Every day | ORAL | 1 refills | Status: DC

## 2020-02-01 MED ORDER — ATORVASTATIN CALCIUM 40 MG PO TABS
40.0000 mg | ORAL_TABLET | Freq: Every day | ORAL | 3 refills | Status: DC
Start: 2020-02-01 — End: 2020-11-24

## 2020-02-01 MED ORDER — GABAPENTIN 400 MG PO CAPS: 400.0000 mg | ORAL_CAPSULE | Freq: Three times a day (TID) | ORAL | 1 refills | Status: DC

## 2020-02-01 NOTE — Progress Notes (Signed)
SUBJECTIVE Chief Complaint  Patient presents with  . Follow-up    [facility]    HPI: Kristy Cooper is a  54 y.o. female.  Adjustment disorder with mixed anxiety and depressed mood Patient presents for follow-up on anxiety and depression today. She reports she is feeling well on the zoloft 100 mg Qd.  diabetes/obesity Last A1c 6.3>6.6> 6.6.  Patient has tried to watch her diet and exercise.    She had been doing really great with her diet and exercise regimen, and admits she has backed off on both her diet and exercise changes.  Last weight 206. She has lost another 5 lbs.   Hyperlipidemia, unspecified hyperlipidemia type/ Hypertriglyceridemia Patient has a history of hyperlipidemia and hypertriglyceridemia.  she tolerating statin.   Low back pain/ DDD (degenerative disc disease),/ Lumbar radiculopathy: Patient is doing well  on gabapentin 400 mg 3 times a day.   Prior note This is a new problem for patient. She states she has had right-sided low back pain for about one month. There was no increase in activity, lifting or known injury acutely. She states she did have an injury when she was a young child, which did not need any medical intervention, but nothing recent. However over the last month she started with right sided lower back pain, that radiated into her buttock and now is radiating down to her right foot. She reports tingling sensation intermittently in her right foot. She denies any weakness. She denies any bladder or bowel changes.   Depression screen [facility] 2/9 02/01/2020 08/10/2019 02/04/2019 05/12/2018 11/08/2017  Decreased Interest 0 0 0 0 0  Down, Depressed, Hopeless 0 0 0 0 0  PHQ - 2 Score 0 0 0 0 0  Altered sleeping 1 3 0 0 0  Tired, decreased energy 0 0 0 0 0  Change in appetite 0 0 0 0 0  Feeling bad or failure about yourself  0 0 0 1 0  Trouble concentrating 0 0 0 0 0  Moving slowly or fidgety/restless 0 0 0 0 0  Suicidal thoughts 0 0 0 0 0  PHQ-9 Score 1 3 0 1 0    Difficult doing work/chores - Not difficult at all Not difficult at all Not difficult at all Not difficult at all   GAD 7 : Generalized Anxiety Score 02/01/2020 08/10/2019 02/04/2019 08/01/2018  Nervous, Anxious, on Edge 0 0 0 1  Control/stop worrying 1 3 0 0  Worry too much - different things 1 3 0 1  Trouble relaxing 0 0 0 0  Restless 0 0 0 0  Easily annoyed or irritable 0 0 0 0  Afraid - awful might happen 0 0 0 1  Total GAD 7 Score 2 6 0 3  Anxiety Difficulty - Not difficult at all Not difficult at all Not difficult at all    ROS: See pertinent positives and negatives per HPI.  Patient Active Problem List   Diagnosis Date Noted  . Arthralgia of left temporomandibular joint 12/24/2018  . DDD (degenerative disc disease), lumbar 09/03/2016  . Lumbar radiculopathy 09/03/2016  . Adjustment disorder with mixed anxiety and depressed mood 07/30/2016  . Irregular menstrual cycle 03/14/2016  . Hyperlipidemia 11/23/2015  . Hypertriglyceridemia 11/23/2015  . Prediabetes 11/02/2015  . Abnormal facial hair 11/02/2015  . History of colon polyps 11/02/2015  . Obesity (BMI 30-39.9) 11/02/2015    Social History   Tobacco Use  . Smoking status: Former Research scientist (life sciences)  . Smokeless tobacco: Never  Used  Substance Use Topics  . Alcohol use: No    Current Outpatient Medications:  .  Aspirin-Acetaminophen-Caffeine (EXCEDRIN MIGRAINE PO), Take 3 tablets by mouth daily as needed (migraines.). , Disp: , Rfl:  .  beta carotene w/minerals (OCUVITE) tablet, Take 1 tablet by mouth daily., Disp: , Rfl:  .  gabapentin (NEURONTIN) 400 MG capsule, Take 1 capsule (400 mg total) by mouth 3 (three) times daily., Disp: 270 capsule, Rfl: 1 .  sertraline (ZOLOFT) 100 MG tablet, Take 1 tablet (100 mg total) by mouth daily., Disp: 90 tablet, Rfl: 1 .  atorvastatin (LIPITOR) 40 MG tablet, Take 40 mg by mouth daily., Disp: , Rfl:   Allergies  Allergen Reactions  . Ibuprofen Swelling    Swelling of eyes and mouth  .  Meloxicam Swelling  . Voltaren [Diclofenac] Swelling    OBJECTIVE: BP 115/75   Pulse 81   Temp 98 F (36.7 C) (Oral)   Ht 5\' 7"  (1.702 m)   Wt 199 lb (90.3 kg)   LMP 10/01/2018 (Approximate)   SpO2 97%   BMI 31.17 kg/m  Gen: Afebrile. No acute distress. Nontoxic. Pleasant female. Obese.  HENT: AT. [place].  Eyes:Pupils Equal Round Reactive to light, Extraocular movements intact,  Conjunctiva without redness, discharge or icterus. CV: RRR no murmur, no edema, +2/4 P posterior tibialis pulses Chest: CTAB, no wheeze or crackles Skin: no rashes, purpura or petechiae.  Neuro:  Normal gait. PERLA. EOMi. Alert. Oriented x3  Psych: Normal affect, dress and demeanor. Normal speech. Normal thought content and judgment.    ASSESSMENT AND PLAN: [name] is a 54 y.o. female present for  Adjustment disorder with mixed anxiety and depressed mood -stable.  - continue  Zoloft. - Follow-up in 6 months, sooner if needed.  Lumbar radiculopathy - stable.  Continue gabapentin 400 mg 3 times daily.  - f/u 6 mos unless needed sooner.   Prediabetes/obesity -a1c today Consider metformin start if appropriate-she still would like to wait on this. Last a1c 6.3> 6.6 > 6.6 again today  Hyperlipidemia, unspecified hyperlipidemia type/ Hypertriglyceridemia - continue statin Fhx HD present in mother.  - diet and exercise.  -continue atorvastatin 20 mg daily. F/u 5.5 mos  Influenza vaccine administered today.   Orders Placed This Encounter  Procedures  . Flu Vaccine QUAD 6+ mos PF IM (Fluarix Quad PF)  . POCT HgB A1C   No orders of the defined types were placed in this encounter.  Referral Orders  No referral(s) requested today     Howard Pouch, DO 02/01/2020

## 2020-02-01 NOTE — Patient Instructions (Addendum)
Follow up in 4 months since we are starting a diabetes medication Metformin start - in the morning with food.  Increase exercise.  Lower sugar/carbohydrates  Diabetes Mellitus and Nutrition, Adult When you have diabetes (diabetes mellitus), it is very important to have healthy eating habits because your blood sugar (glucose) levels are greatly affected by what you eat and drink. Eating healthy foods in the appropriate amounts, at about the same times every day, can help you:  Control your blood glucose.  Lower your risk of heart disease.  Improve your blood pressure.  Reach or maintain a healthy weight. Every person with diabetes is different, and each person has different needs for a meal plan. Your health care provider may recommend that you work with a diet and nutrition specialist (dietitian) to make a meal plan that is best for you. Your meal plan may vary depending on factors such as:  The calories you need.  The medicines you take.  Your weight.  Your blood glucose, blood pressure, and cholesterol levels.  Your activity level.  Other health conditions you have, such as heart or kidney disease. How do carbohydrates affect me? Carbohydrates, also called carbs, affect your blood glucose level more than any other type of food. Eating carbs naturally raises the amount of glucose in your blood. Carb counting is a method for keeping track of how many carbs you eat. Counting carbs is important to keep your blood glucose at a healthy level, especially if you use insulin or take certain oral diabetes medicines. It is important to know how many carbs you can safely have in each meal. This is different for every person. Your dietitian can help you calculate how many carbs you should have at each meal and for each snack. Foods that contain carbs include:  Bread, cereal, rice, pasta, and crackers.  Potatoes and corn.  Peas, beans, and lentils.  Milk and yogurt.  Fruit and  juice.  Desserts, such as cakes, cookies, ice cream, and candy. How does alcohol affect me? Alcohol can cause a sudden decrease in blood glucose (hypoglycemia), especially if you use insulin or take certain oral diabetes medicines. Hypoglycemia can be a life-threatening condition. Symptoms of hypoglycemia (sleepiness, dizziness, and confusion) are similar to symptoms of having too much alcohol. If your health care provider says that alcohol is safe for you, follow these guidelines:  Limit alcohol intake to no more than 1 drink per day for nonpregnant women and 2 drinks per day for men. One drink equals 12 oz of beer, 5 oz of wine, or 1 oz of hard liquor.  Do not drink on an empty stomach.  Keep yourself hydrated with water, diet soda, or unsweetened iced tea.  Keep in mind that regular soda, juice, and other mixers may contain a lot of sugar and must be counted as carbs. What are tips for following this plan?  Reading food labels  Start by checking the serving size on the "Nutrition Facts" label of packaged foods and drinks. The amount of calories, carbs, fats, and other nutrients listed on the label is based on one serving of the item. Many items contain more than one serving per package.  Check the total grams (g) of carbs in one serving. You can calculate the number of servings of carbs in one serving by dividing the total carbs by 15. For example, if a food has 30 g of total carbs, it would be equal to 2 servings of carbs.  Check the number  of grams (g) of saturated and trans fats in one serving. Choose foods that have low or no amount of these fats.  Check the number of milligrams (mg) of salt (sodium) in one serving. Most people should limit total sodium intake to less than 2,300 mg per day.  Always check the nutrition information of foods labeled as "low-fat" or "nonfat". These foods may be higher in added sugar or refined carbs and should be avoided.  Talk to your dietitian to  identify your daily goals for nutrients listed on the label. Shopping  Avoid buying canned, premade, or processed foods. These foods tend to be high in fat, sodium, and added sugar.  Shop around the outside edge of the grocery store. This includes fresh fruits and vegetables, bulk grains, fresh meats, and fresh dairy. Cooking  Use low-heat cooking methods, such as baking, instead of high-heat cooking methods like deep frying.  Cook using healthy oils, such as olive, canola, or sunflower oil.  Avoid cooking with butter, cream, or high-fat meats. Meal planning  Eat meals and snacks regularly, preferably at the same times every day. Avoid going long periods of time without eating.  Eat foods high in fiber, such as fresh fruits, vegetables, beans, and whole grains. Talk to your dietitian about how many servings of carbs you can eat at each meal.  Eat 4-6 ounces (oz) of lean protein each day, such as lean meat, chicken, fish, eggs, or tofu. One oz of lean protein is equal to: ? 1 oz of meat, chicken, or fish. ? 1 egg. ?  cup of tofu.  Eat some foods each day that contain healthy fats, such as avocado, nuts, seeds, and fish. Lifestyle  Check your blood glucose regularly.  Exercise regularly as told by your health care provider. This may include: ? 150 minutes of moderate-intensity or vigorous-intensity exercise each week. This could be brisk walking, biking, or water aerobics. ? Stretching and doing strength exercises, such as yoga or weightlifting, at least 2 times a week.  Take medicines as told by your health care provider.  Do not use any products that contain nicotine or tobacco, such as cigarettes and e-cigarettes. If you need help quitting, ask your health care provider.  Work with a Social worker or diabetes educator to identify strategies to manage stress and any emotional and social challenges. Questions to ask a health care provider  Do I need to meet with a diabetes  educator?  Do I need to meet with a dietitian?  What number can I call if I have questions?  When are the best times to check my blood glucose? Where to find more information:  American Diabetes Association: diabetes.org  Academy of Nutrition and Dietetics: www.eatright.[employer] of Diabetes and Digestive and Kidney Diseases (NIH): [employer] Summary  A healthy meal plan will help you control your blood glucose and maintain a healthy lifestyle.  Working with a diet and nutrition specialist (dietitian) can help you make a meal plan that is best for you.  Keep in mind that carbohydrates (carbs) and alcohol have immediate effects on your blood glucose levels. It is important to count carbs and to use alcohol carefully. This information is not intended to replace advice given to you by your health care provider. Make sure you discuss any questions you have with your health care provider. Document Revised: 03/01/2017 Document Reviewed: 04/23/2016 Elsevier Patient Education  2020 [employer].

## 2020-05-04 ENCOUNTER — Other Ambulatory Visit: Payer: Self-pay | Admitting: Family Medicine

## 2020-05-04 DIAGNOSIS — F4323 Adjustment disorder with mixed anxiety and depressed mood: Secondary | ICD-10-CM

## 2020-05-04 DIAGNOSIS — M5416 Radiculopathy, lumbar region: Secondary | ICD-10-CM

## 2020-08-30 DIAGNOSIS — Z01419 Encounter for gynecological examination (general) (routine) without abnormal findings: Secondary | ICD-10-CM | POA: Diagnosis not present

## 2020-08-30 DIAGNOSIS — Z1231 Encounter for screening mammogram for malignant neoplasm of breast: Secondary | ICD-10-CM | POA: Diagnosis not present

## 2020-08-30 DIAGNOSIS — Z6832 Body mass index (BMI) 32.0-32.9, adult: Secondary | ICD-10-CM | POA: Diagnosis not present

## 2020-08-30 LAB — RESULTS CONSOLE HPV: CHL HPV: NEGATIVE

## 2020-08-30 LAB — HM PAP SMEAR

## 2020-09-01 DIAGNOSIS — S62668A Nondisplaced fracture of distal phalanx of other finger, initial encounter for closed fracture: Secondary | ICD-10-CM | POA: Diagnosis not present

## 2020-09-01 DIAGNOSIS — M79645 Pain in left finger(s): Secondary | ICD-10-CM | POA: Diagnosis not present

## 2020-11-02 ENCOUNTER — Other Ambulatory Visit: Payer: Self-pay

## 2020-11-02 ENCOUNTER — Encounter: Payer: Self-pay | Admitting: Family Medicine

## 2020-11-02 ENCOUNTER — Ambulatory Visit (INDEPENDENT_AMBULATORY_CARE_PROVIDER_SITE_OTHER): Payer: BC Managed Care – PPO | Admitting: Family Medicine

## 2020-11-02 VITALS — BP 107/71 | HR 89 | Temp 98.3°F | Ht 67.0 in | Wt 197.0 lb

## 2020-11-02 DIAGNOSIS — R109 Unspecified abdominal pain: Secondary | ICD-10-CM

## 2020-11-02 DIAGNOSIS — M546 Pain in thoracic spine: Secondary | ICD-10-CM | POA: Diagnosis not present

## 2020-11-02 DIAGNOSIS — R1030 Lower abdominal pain, unspecified: Secondary | ICD-10-CM

## 2020-11-02 DIAGNOSIS — M5136 Other intervertebral disc degeneration, lumbar region: Secondary | ICD-10-CM

## 2020-11-02 DIAGNOSIS — M5416 Radiculopathy, lumbar region: Secondary | ICD-10-CM | POA: Diagnosis not present

## 2020-11-02 LAB — POCT URINALYSIS DIPSTICK
Bilirubin, UA: NEGATIVE
Blood, UA: NEGATIVE
Glucose, UA: NEGATIVE
Ketones, UA: NEGATIVE
Leukocytes, UA: NEGATIVE
Nitrite, UA: NEGATIVE
Protein, UA: POSITIVE — AB
Spec Grav, UA: 1.025 (ref 1.010–1.025)
Urobilinogen, UA: 0.2 E.U./dL
pH, UA: 6 (ref 5.0–8.0)

## 2020-11-02 NOTE — Progress Notes (Signed)
This visit occurred during the SARS-CoV-2 public health emergency.  Safety protocols were in place, including screening questions prior to the visit, additional usage of staff PPE, and extensive cleaning of exam room while observing appropriate contact time as indicated for disinfecting solutions.    Kristy Cooper , [birth year], 55 y.o., female MRN: [id number] Patient Care Team    Relationship Specialty Notifications Start End  [name], DO PCP - General Family Medicine  11/02/15     Chief Complaint  Patient presents with   Back Pain    Pt c/o L side mid back pain x 3 weeks, pain describes as burning pain; pt also reports urine freq, urgency, dysuria;      Subjective: Pt presents for an OV with complaints of intermittent left lower back discomfort.  She reports the pain has been there for at least 3 weeks.  She describes the pain as a sharp electric burning pain.  She reports the pain is quick and fleeting.  She denies any urinary frequency, hematuria, dysuria, fever or nausea.  She has no history of kidney stones in the past.  She reports it will cause discomfort that radiates to her left groin at times.  Depression screen [facility] 2/9 02/01/2020 08/10/2019 02/04/2019 05/12/2018 11/08/2017  Decreased Interest 0 0 0 0 0  Down, Depressed, Hopeless 0 0 0 0 0  PHQ - 2 Score 0 0 0 0 0  Altered sleeping 1 3 0 0 0  Tired, decreased energy 0 0 0 0 0  Change in appetite 0 0 0 0 0  Feeling bad or failure about yourself  0 0 0 1 0  Trouble concentrating 0 0 0 0 0  Moving slowly or fidgety/restless 0 0 0 0 0  Suicidal thoughts 0 0 0 0 0  PHQ-9 Score 1 3 0 1 0  Difficult doing work/chores - Not difficult at all Not difficult at all Not difficult at all Not difficult at all    Allergies  Allergen Reactions   Ibuprofen Swelling    Swelling of eyes and mouth   Meloxicam Swelling   Voltaren [Diclofenac] Swelling   Social History   Social History Narrative   Married to [place]. 2 chilrn  Bubba Hales)- special needs and an adult child. She also has a grandchild.    HS grad. Artist.   Drinks caffeine. Takes daily vitamin.   Wears seatbelt. Wears bicycle helmet. Smoke detector in the home.    Exercises routinely.    Feels safe in relationships.           Past Medical History:  Diagnosis Date   Bilateral foot pain    chronic- told arthritis and was prescribed tramadol.    Colon polyp 2014   tubular adenoma   Depression    Depression with anxiety    GERD (gastroesophageal reflux disease)    Hyperlipidemia LDL goal <130    Left breast mass    Obesity    Prediabetes    Right rotator cuff tendinitis 2013   Sciatic leg pain 2019   right leg   Past Surgical History:  Procedure Laterality Date   BREAST LUMPECTOMY WITH RADIOACTIVE SEED LOCALIZATION Left 11/25/2019   Procedure: RADIOACTIVE SEED GUIDED LEFT BREAST LUMPECTOMY;  Surgeon: Coralie Keens, MD;  Location: [place];  Service: General;  Laterality: Left;  LMA   CESAREAN SECTION     Family History  Problem Relation Age of Onset   Breast cancer Mother  Diabetes Mother    Mental illness Mother    Heart disease Mother    Arthritis Mother    Arthritis Father    Mental illness Father    Breast cancer Maternal Aunt    Diabetes Maternal Aunt    Arthritis Maternal Aunt    Colon cancer Maternal Uncle    Diabetes Maternal Uncle    Colon cancer Paternal Aunt    Diabetes Paternal Aunt    Heart disease Paternal Aunt    Allergies as of 11/02/2020       Reactions   Ibuprofen Swelling   Swelling of eyes and mouth   Meloxicam Swelling   Voltaren [diclofenac] Swelling        Medication List        Accurate as of November 02, 2020  5:35 PM. If you have any questions, ask your nurse or doctor.          STOP taking these medications    metFORMIN 500 MG tablet Commonly known as: GLUCOPHAGE Stopped by: Howard Pouch, DO       TAKE these medications    atorvastatin 40 MG  tablet Commonly known as: LIPITOR Take 1 tablet (40 mg total) by mouth daily.   beta carotene w/minerals tablet Take 1 tablet by mouth daily.   EXCEDRIN MIGRAINE PO Take 3 tablets by mouth daily as needed (migraines.).   gabapentin 400 MG capsule Commonly known as: NEURONTIN Take 1 capsule (400 mg total) by mouth 3 (three) times daily.   sertraline 100 MG tablet Commonly known as: ZOLOFT Take 1 tablet (100 mg total) by mouth daily.        All past medical history, surgical history, allergies, family history, immunizations andmedications were updated in the EMR today and reviewed under the history and medication portions of their EMR.     ROS: Negative, with the exception of above mentioned in HPI   Objective:  BP 107/71   Pulse 89   Temp 98.3 F (36.8 C) (Oral)   Ht '5\' 7"'$  (1.702 m)   Wt 197 lb (89.4 kg)   LMP 10/01/2018 (Approximate)   SpO2 97%   BMI 30.85 kg/m  Body mass index is 30.85 kg/m. Gen: Afebrile. No acute distress. Nontoxic in appearance, well developed, well nourished.  HENT: AT. [place].  Eyes:Pupils Equal Round Reactive to light, Extraocular movements intact,  Conjunctiva without redness, discharge or icterus. CV: RRR  Chest: CTAB, Abd: Soft. NTND. BS present.  No masses palpated. No rebound or guarding.  MSK: No CVA tenderness bilaterally.  No tenderness to palpation left thoracic or lumbar back. Skin: No rashes, purpura or petechiae.  Neuro: Normal gait. PERLA. EOMi. Alert. Oriented x3  Psych: Normal affect, dress and demeanor. Normal speech. Normal thought content and judgment.  No results found. No results found.   Results for orders placed or performed in visit on 11/02/20 (from the past 24 hour(s))  POCT Urinalysis Dipstick     Status: Abnormal   Collection Time: 11/02/20  2:09 PM  Result Value Ref Range   Color, UA yellow    Clarity, UA clear    Glucose, UA Negative Negative   Bilirubin, UA negative    Ketones, UA negatvie    Spec Grav,  UA 1.025 1.010 - 1.025   Blood, UA negative    pH, UA 6.0 5.0 - 8.0   Protein, UA Positive (A) Negative   Urobilinogen, UA 0.2 0.2 or 1.0 E.U./dL   Nitrite, UA negative  Leukocytes, UA Negative Negative   Appearance     Odor      Assessment/Plan: Kristy Cooper is a 55 y.o. female present for OV for  Acute left-sided thoracic back pain/Flank pain/Lower abdominal pain Patient does not have a history of kidney stones but her complaints of intermittent sharp pain that can shoot to her right groin from her lower thoracic/lumbar back can be considered consistent with kidney stones. Will send urinalysis with reflex microscopic. Discussed signs and symptoms of urinary obstruction. She is allergic to NSAIDs. We discussed dietary factors and and hydrating well with water and lemonade can be helpful. - POCT Urinalysis Dipstick - Basic Metabolic Panel (BMET) - Urinalysis, Routine w reflex microscopic - CT RENAL STONE STUDY; Future If stone is appreciated, would call in Flomax for her.  Reviewed expectations re: course of current medical issues. Discussed self-management of symptoms. Outlined signs and symptoms indicating need for more acute intervention. Patient verbalized understanding and all questions were answered. Patient received an After-Visit Summary.    Orders Placed This Encounter  Procedures   CT RENAL STONE STUDY   Basic Metabolic Panel (BMET)   Urinalysis, Routine w reflex microscopic   POCT Urinalysis Dipstick   No orders of the defined types were placed in this encounter.  Referral Orders  No referral(s) requested today     Note is dictated utilizing voice recognition software. Although note has been proof read prior to signing, occasional typographical errors still can be missed. If any questions arise, please do not hesitate to call for verification.   electronically signed by:  Howard Pouch, DO  [place]

## 2020-11-02 NOTE — Patient Instructions (Signed)
Kidney Stones Kidney stones are rock-like masses that form inside of the kidneys. Kidneys are organs that make pee (urine). A kidney stone may move into other parts of the urinary tract, including: The tubes that connect the kidneys to the bladder (ureters). The bladder. The tube that carries urine out of the body (urethra). Kidney stones can cause very bad pain and can block the flow of pee. The stone usually leaves your body (passes) through your pee. You may need to have a doctor take out the stone. What are the causes? Kidney stones may be caused by: A condition in which certain glands make too much parathyroid hormone (primary hyperparathyroidism). A buildup of a type of crystals in the bladder made of a chemical called uric acid. The body makes uric acid when you eat certain foods. Narrowing (stricture) of one or both of the ureters. A kidney blockage that you were born with. Past surgery on the kidney or the ureters, such as gastric bypass surgery. What increases the risk? You are more likely to develop this condition if: You have had a kidney stone in the past. You have a family history of kidney stones. You do not drink enough water. You eat a diet that is high in protein, salt (sodium), or sugar. You are overweight or very overweight (obese). What are the signs or symptoms? Symptoms of a kidney stone may include: Pain in the side of the belly, right below the ribs (flank pain). Pain usually spreads (radiates) to the groin. Needing to pee often or right away (urgently). Pain when going pee (urinating). Blood in your pee (hematuria). Feeling like you may vomit (nauseous). Vomiting. Fever and chills. How is this treated? Treatment depends on the size, location, and makeup of the kidney stones. The stones will often pass out of the body through peeing. You may need to: Drink more fluid to help pass the stone. In some cases, you may be given fluids through an IV tube put into one  of your veins at the hospital. Take medicine for pain. Make changes in your diet to help keep kidney stones from coming back. Sometimes, medical procedures are needed to remove a kidney stone. This may involve: A procedure to break up kidney stones using a beam of light (laser) or shock waves. Surgery to remove the kidney stones. Follow these instructions at home: Medicines Take over-the-counter and prescription medicines only as told by your doctor. Ask your doctor if the medicine prescribed to you requires you to avoid driving or using heavy machinery. Eating and drinking Drink enough fluid to keep your pee pale yellow. You may be told to drink at least 8-10 glasses of water each day. This will help you pass the stone. If told by your doctor, change your diet. This may include: Limiting how much salt you eat. Eating more fruits and vegetables. Limiting how much meat, poultry, fish, and eggs you eat. Follow instructions from your doctor about eating or drinking restrictions. General instructions Collect pee samples as told by your doctor. You may need to collect a pee sample: 24 hours after a stone comes out. 8-12 weeks after a stone comes out, and every 6-12 months after that. Strain your pee every time you pee (urinate), for as long as told. Use the strainer that your doctor recommends. Do not throw out the stone. Keep it so that it can be tested by your doctor. Keep all follow-up visits as told by your doctor. This is important. You may need   follow-up tests. How is this prevented? To prevent another kidney stone: Drink enough fluid to keep your pee pale yellow. This is the best way to prevent kidney stones. Eat healthy foods. Avoid certain foods as told by your doctor. You may be told to eat less protein. Stay at a healthy weight. Where to find more information [place] ([facility]): www.kidney.[facility]): www.urologyhealth.org Contact a doctor  if: You have pain that gets worse or does not get better with medicine. Get help right away if: You have a fever or chills. You get very bad pain. You get new pain in your belly (abdomen). You pass out (faint). You cannot pee. Summary Kidney stones are rock-like masses that form inside of the kidneys. Kidney stones can cause very bad pain and can block the flow of pee. The stones will often pass out of the body through peeing. Drink enough fluid to keep your pee pale yellow. This information is not intended to replace advice given to you by your health care provider. Make sure you discuss any questions you have with your healthcare provider. Textbook of Natural Medicine (5th ed., pp. [phone]). [place], [place]: Elsevier.">  Dietary Guidelines to Help Prevent Kidney Stones Kidney stones are deposits of minerals and salts that form inside your kidneys. Your risk of developing kidney stones may be greater depending on your diet, your lifestyle, the medicines you take, and whether you have certain medical conditions. Most people can lower their chances of developing kidney stones by following the instructions below. Your dietitian may give you more specific instructions depending on your overall health and the type of kidney stones youtend to develop. What are tips for following this plan? Reading food labels  Choose foods with "no salt added" or "low-salt" labels. Limit your salt (sodium) intake to less than 1,500 mg a day. Choose foods with calcium for each meal and snack. Try to eat about 300 mg of calcium at each meal. Foods that contain 200-500 mg of calcium a serving include: 8 oz (237 mL) of milk, calcium-fortifiednon-dairy milk, and calcium-fortifiedfruit juice. Calcium-fortified means that calcium has been added to these drinks. 8 oz (237 mL) of kefir, yogurt, and soy yogurt. 4 oz (114 g) of tofu. 1 oz (28 g) of cheese. 1 cup (150 g) of dried figs. 1 cup (91 g) of cooked  broccoli. One 3 oz (85 g) can of sardines or mackerel. Most people need 1,000-1,500 mg of calcium a day. Talk to your dietitian abouthow much calcium is recommended for you. Shopping Buy plenty of fresh fruits and vegetables. Most people do not need to avoid fruits and vegetables, even if these foods contain nutrients that may contribute to kidney stones. When shopping for convenience foods, choose: Whole pieces of fruit. Pre-made salads with dressing on the side. Low-fat fruit and yogurt smoothies. Avoid buying frozen meals or prepared deli foods. These can be high in sodium. Look for foods with live cultures, such as yogurt and kefir. Choose high-fiber grains, such as whole-wheat breads, oat bran, and wheat cereals. Cooking Do not add salt to food when cooking. Place a salt shaker on the table and allow each person to add his or her own salt to taste. Use vegetable protein, such as beans, textured vegetable protein (TVP), or tofu, instead of meat in pasta, casseroles, and soups. Meal planning Eat less salt, if told by your dietitian. To do this: Avoid eating processed or pre-made food. Avoid eating fast food. Eat less  animal protein, including cheese, meat, poultry, or fish, if told by your dietitian. To do this: Limit the number of times you have meat, poultry, fish, or cheese each week. Eat a diet free of meat at least 2 days a week. Eat only one serving each day of meat, poultry, fish, or seafood. When you prepare animal protein, cut pieces into small portion sizes. For most meat and fish, one serving is about the size of the palm of your hand. Eat at least five servings of fresh fruits and vegetables each day. To do this: Keep fruits and vegetables on hand for snacks. Eat one piece of fruit or a handful of berries with breakfast. Have a salad and fruit at lunch. Have two kinds of vegetables at dinner. Limit foods that are high in a substance called oxalate. These include: Spinach  (cooked), rhubarb, beets, sweet potatoes, and Swiss chard. Peanuts. Potato chips, french fries, and baked potatoes with skin on. Nuts and nut products. Chocolate. If you regularly take a diuretic medicine, make sure to eat at least 1 or 2 servings of fruits or vegetables that are high in potassium each day. These include: Avocado. Banana. Orange, prune, carrot, or tomato juice. Baked potato. Cabbage. Beans and split peas. Lifestyle  Drink enough fluid to keep your urine pale yellow. This is the most important thing you can do. Spread your fluid intake throughout the day. If you drink alcohol: Limit how much you use to: 0-1 drink a day for women who are not pregnant. 0-2 drinks a day for men. Be aware of how much alcohol is in your drink. In the [place] one drink equals one 12 oz bottle of beer (355 mL), one 5 oz glass of wine (148 mL), or one 1 oz glass of hard liquor (44 mL). Lose weight if told by your health care provider. Work with your dietitian to find an eating plan and weight loss strategies that work best for you.  General information Talk to your health care provider and dietitian about taking daily supplements. You may be told the following depending on your health and the cause of your kidney stones: Not to take supplements with vitamin C. To take a calcium supplement. To take a daily probiotic supplement. To take other supplements such as magnesium, fish oil, or vitamin B6. Take over-the-counter and prescription medicines only as told by your health care provider. These include supplements. What foods should I limit? Limit your intake of the following foods, or eat them as told by your dietitian. Vegetables Spinach. Rhubarb. Beets. Canned vegetables. Angie Fava. Olives. Baked potatoeswith skin. Grains Wheat bran. Baked goods. Salted crackers. Cereals high in sugar. Meats and other proteins Nuts. Nut butters. Large portions of meat, poultry, or fish. Salted, precooked,or  cured meats, such as sausages, meat loaves, and hot dogs. Dairy Cheese. Beverages Regular soft drinks. Regular vegetable juice. Seasonings and condiments Seasoning blends with salt. Salad dressings. Soy sauce. Ketchup. Barbecue sauce. Other foods Canned soups. Canned pasta sauce. Casseroles. Pizza. Lasagna. Frozen meals.Potato chips. [place] fries. The items listed above may not be a complete list of foods and beverages you should limit. Contact a dietitian for more information. What foods should I avoid? Talk to your dietitian about specific foods you should avoid based on the typeof kidney stones you have and your overall health. Fruits Grapefruit. The item listed above may not be a complete list of foods and beverages you should avoid. Contact a dietitian for more information. Summary Kidney stones are  deposits of minerals and salts that form inside your kidneys. You can lower your risk of kidney stones by making changes to your diet. The most important thing you can do is drink enough fluid. Drink enough fluid to keep your urine pale yellow. Talk to your dietitian about how much calcium you should have each day, and eat less salt and animal protein as told by your dietitian. This information is not intended to replace advice given to you by your health care provider. Make sure you discuss any questions you have with your healthcare provider. Document Revised: 03/12/2019 Document Reviewed: 03/12/2019 Elsevier Patient Education  2022 [place] Revised: 08/01/2018 Document Reviewed: 08/05/2018 Elsevier Patient Education  2022 [employer].

## 2020-11-03 LAB — BASIC METABOLIC PANEL
BUN: 21 mg/dL (ref 7–25)
CO2: 22 mmol/L (ref 20–32)
Calcium: 9.9 mg/dL (ref 8.6–10.4)
Chloride: 103 mmol/L (ref 98–110)
Creat: 0.78 mg/dL (ref 0.50–1.03)
Glucose, Bld: 146 mg/dL — ABNORMAL HIGH (ref 65–99)
Potassium: 4.4 mmol/L (ref 3.5–5.3)
Sodium: 139 mmol/L (ref 135–146)

## 2020-11-03 LAB — URINALYSIS, ROUTINE W REFLEX MICROSCOPIC
Bilirubin Urine: NEGATIVE
Glucose, UA: NEGATIVE
Hgb urine dipstick: NEGATIVE
Ketones, ur: NEGATIVE
Leukocytes,Ua: NEGATIVE
Nitrite: NEGATIVE
Protein, ur: NEGATIVE
Specific Gravity, Urine: 1.023 (ref 1.001–1.035)
pH: 5.5 (ref 5.0–8.0)

## 2020-11-23 ENCOUNTER — Other Ambulatory Visit: Payer: Self-pay

## 2020-11-23 ENCOUNTER — Ambulatory Visit: Payer: BC Managed Care – PPO | Admitting: Family Medicine

## 2020-11-23 ENCOUNTER — Encounter: Payer: Self-pay | Admitting: Family Medicine

## 2020-11-23 ENCOUNTER — Ambulatory Visit (INDEPENDENT_AMBULATORY_CARE_PROVIDER_SITE_OTHER): Payer: BC Managed Care – PPO | Admitting: Family Medicine

## 2020-11-23 VITALS — BP 112/69 | HR 81 | Temp 98.2°F | Ht 67.0 in | Wt 199.0 lb

## 2020-11-23 DIAGNOSIS — E1169 Type 2 diabetes mellitus with other specified complication: Secondary | ICD-10-CM

## 2020-11-23 DIAGNOSIS — M5416 Radiculopathy, lumbar region: Secondary | ICD-10-CM

## 2020-11-23 DIAGNOSIS — Z6831 Body mass index (BMI) 31.0-31.9, adult: Secondary | ICD-10-CM

## 2020-11-23 DIAGNOSIS — F4323 Adjustment disorder with mixed anxiety and depressed mood: Secondary | ICD-10-CM

## 2020-11-23 DIAGNOSIS — R7303 Prediabetes: Secondary | ICD-10-CM | POA: Diagnosis not present

## 2020-11-23 DIAGNOSIS — E785 Hyperlipidemia, unspecified: Secondary | ICD-10-CM | POA: Diagnosis not present

## 2020-11-23 DIAGNOSIS — Z79899 Other long term (current) drug therapy: Secondary | ICD-10-CM

## 2020-11-23 DIAGNOSIS — M5136 Other intervertebral disc degeneration, lumbar region: Secondary | ICD-10-CM

## 2020-11-23 DIAGNOSIS — Z23 Encounter for immunization: Secondary | ICD-10-CM

## 2020-11-23 DIAGNOSIS — Z6830 Body mass index (BMI) 30.0-30.9, adult: Secondary | ICD-10-CM | POA: Insufficient documentation

## 2020-11-23 LAB — POCT GLYCOSYLATED HEMOGLOBIN (HGB A1C)
HbA1c POC (<> result, manual entry): 7.6 % (ref 4.0–5.6)
HbA1c, POC (controlled diabetic range): 7.6 % — AB (ref 0.0–7.0)
HbA1c, POC (prediabetic range): 7.6 % — AB (ref 5.7–6.4)
Hemoglobin A1C: 7.6 % — AB (ref 4.0–5.6)

## 2020-11-23 MED ORDER — SERTRALINE HCL 100 MG PO TABS: 100.0000 mg | ORAL_TABLET | Freq: Every day | ORAL | 1 refills | Status: DC

## 2020-11-23 MED ORDER — METFORMIN HCL 500 MG PO TABS: 500.0000 mg | ORAL_TABLET | Freq: Two times a day (BID) | ORAL | 1 refills | Status: DC

## 2020-11-23 MED ORDER — GABAPENTIN 400 MG PO CAPS: 400.0000 mg | ORAL_CAPSULE | Freq: Three times a day (TID) | ORAL | 1 refills | Status: DC

## 2020-11-23 NOTE — Patient Instructions (Addendum)
Great to see you today.  I have refilled the medication(s) we provide.   If labs were collected, we will inform you of lab results once received either by echart message or telephone call.   - echart message- for normal results that have been seen by the patient already.   - telephone call: abnormal results or if patient has not viewed results in their echart.  Start metformin one tab twice a day with meals. Increase exercise and start following a stricter diabetic diet.   Follow up in 3 months.   Diabetes Mellitus and Nutrition, Adult When you have diabetes, or diabetes mellitus, it is very important to have healthy eating habits because your blood sugar (glucose) levels are greatly affected by what you eat and drink. Eating healthy foods in the right amounts, at about the same times every day, can help you: Control your blood glucose. Lower your risk of heart disease. Improve your blood pressure. Reach or maintain a healthy weight. What can affect my meal plan? Every person with diabetes is different, and each person has different needs for a meal plan. Your health care provider may recommend that you work with a dietitian to make a meal plan that is best for you. Your meal plan may vary depending on factors such as: The calories you need. The medicines you take. Your weight. Your blood glucose, blood pressure, and cholesterol levels. Your activity level. Other health conditions you have, such as heart or kidney disease. How do carbohydrates affect me? Carbohydrates, also called carbs, affect your blood glucose level more than any other type of food. Eating carbs naturally raises the amount of glucose in your blood. Carb counting is a method for keeping track of how many carbs you eat. Counting carbs is important to keep your blood glucose at a healthy level,especially if you use insulin or take certain oral diabetes medicines. It is important to know how many carbs you can safely have  in each meal. This is different for every person. Your dietitian can help you calculate how manycarbs you should have at each meal and for each snack. How does alcohol affect me? Alcohol can cause a sudden decrease in blood glucose (hypoglycemia), especially if you use insulin or take certain oral diabetes medicines. Hypoglycemia can be a life-threatening condition. Symptoms of hypoglycemia, such as sleepiness, dizziness, and confusion, are similar to symptoms of having too much alcohol. Do not drink alcohol if: Your health care provider tells you not to drink. You are pregnant, may be pregnant, or are planning to become pregnant. If you drink alcohol: Do not drink on an empty stomach. Limit how much you use to: 0-1 drink a day for women. 0-2 drinks a day for men. Be aware of how much alcohol is in your drink. In the [place] one drink equals one 12 oz bottle of beer (355 mL), one 5 oz glass of wine (148 mL), or one 1 oz glass of hard liquor (44 mL). Keep yourself hydrated with water, diet soda, or unsweetened iced tea. Keep in mind that regular soda, juice, and other mixers may contain a lot of sugar and must be counted as carbs. What are tips for following this plan?  Reading food labels Start by checking the serving size on the "Nutrition Facts" label of packaged foods and drinks. The amount of calories, carbs, fats, and other nutrients listed on the label is based on one serving of the item. Many items contain more than one serving per  package. Check the total grams (g) of carbs in one serving. You can calculate the number of servings of carbs in one serving by dividing the total carbs by 15. For example, if a food has 30 g of total carbs per serving, it would be equal to 2 servings of carbs. Check the number of grams (g) of saturated fats and trans fats in one serving. Choose foods that have a low amount or none of these fats. Check the number of milligrams (mg) of salt (sodium) in one  serving. Most people should limit total sodium intake to less than 2,300 mg per day. Always check the nutrition information of foods labeled as "low-fat" or "nonfat." These foods may be higher in added sugar or refined carbs and should be avoided. Talk to your dietitian to identify your daily goals for nutrients listed on the label. Shopping Avoid buying canned, pre-made, or processed foods. These foods tend to be high in fat, sodium, and added sugar. Shop around the outside edge of the grocery store. This is where you will most often find fresh fruits and vegetables, bulk grains, fresh meats, and fresh dairy. Cooking Use low-heat cooking methods, such as baking, instead of high-heat cooking methods like deep frying. Cook using healthy oils, such as olive, canola, or sunflower oil. Avoid cooking with butter, cream, or high-fat meats. Meal planning Eat meals and snacks regularly, preferably at the same times every day. Avoid going long periods of time without eating. Eat foods that are high in fiber, such as fresh fruits, vegetables, beans, and whole grains. Talk with your dietitian about how many servings of carbs you can eat at each meal. Eat 4-6 oz (112-168 g) of lean protein each day, such as lean meat, chicken, fish, eggs, or tofu. One ounce (oz) of lean protein is equal to: 1 oz (28 g) of meat, chicken, or fish. 1 egg.  cup (62 g) of tofu. Eat some foods each day that contain healthy fats, such as avocado, nuts, seeds, and fish. What foods should I eat? Fruits Berries. Apples. Oranges. Peaches. Apricots. Plums. Grapes. Mango. Papaya.Pomegranate. Kiwi. Cherries. Vegetables Lettuce. Spinach. Leafy greens, including kale, chard, collard greens, and mustard greens. Beets. Cauliflower. Cabbage. Broccoli. Carrots. Green beans.Tomatoes. Peppers. Onions. Cucumbers. Brussels sprouts. Grains Whole grains, such as whole-wheat or whole-grain bread, crackers, tortillas,cereal, and pasta. Unsweetened  oatmeal. Quinoa. Brown or wild rice. Meats and other proteins Seafood. Poultry without skin. Lean cuts of poultry and beef. Tofu. Nuts. Seeds. Dairy Low-fat or fat-free dairy products such as milk, yogurt, and cheese. The items listed above may not be a complete list of foods and beverages you can eat. Contact a dietitian for more information. What foods should I avoid? Fruits Fruits canned with syrup. Vegetables Canned vegetables. Frozen vegetables with butter or cream sauce. Grains Refined white flour and flour products such as bread, pasta, snack foods, andcereals. Avoid all processed foods. Meats and other proteins Fatty cuts of meat. Poultry with skin. Breaded or fried meats. Processed meat.Avoid saturated fats. Dairy Full-fat yogurt, cheese, or milk. Beverages Sweetened drinks, such as soda or iced tea. The items listed above may not be a complete list of foods and beverages you should avoid. Contact a dietitian for more information. Questions to ask a health care provider Do I need to meet with a diabetes educator? Do I need to meet with a dietitian? What number can I call if I have questions? When are the best times to check my blood glucose? Where  to find more information: American Diabetes Association: diabetes.org Academy of Nutrition and Dietetics: www.eatright.[employer] of Diabetes and Digestive and Kidney Diseases: [employer] Association of Diabetes Care and Education Specialists: www.diabeteseducator.org Summary It is important to have healthy eating habits because your blood sugar (glucose) levels are greatly affected by what you eat and drink. A healthy meal plan will help you control your blood glucose and maintain a healthy lifestyle. Your health care provider may recommend that you work with a dietitian to make a meal plan that is best for you. Keep in mind that carbohydrates (carbs) and alcohol have immediate effects on your blood glucose  levels. It is important to count carbs and to use alcohol carefully. This information is not intended to replace advice given to you by your health care provider. Make sure you discuss any questions you have with your healthcare provider. Document Revised: 02/24/2019 Document Reviewed: 02/24/2019 Elsevier Patient Education  2021 [employer].

## 2020-11-23 NOTE — Progress Notes (Signed)
Patient Care Team    Relationship Specialty Notifications Start End  Kristy Hillock, DO PCP - General Family Medicine  11/02/15      SUBJECTIVE Chief Complaint  Patient presents with   Anxiety    CMC; pt is not fasting    HPI: Kristy Cooper is a  55 y.o. female.  Adjustment disorder with mixed anxiety and depressed mood Patient presents for follow-up on anxiety and depression today. She reports she is feeling well on the zoloft 100 mg Qd.  She would like to continue medication   diabetes/obesity Last A1c 6.3>6.6> 6.6.   Patient has tried to watch her diet and exercise.    patient admits she has not been exercising or watching her diet lately.  Weight has remained at 199 lbs.   Hyperlipidemia, unspecified hyperlipidemia type/ Hypertriglyceridemia Patient has a history of hyperlipidemia and hypertriglyceridemia.fhx HD.   She is compliant with statin.  No negative side effects reported.  Low back pain/ DDD (degenerative disc disease),/ Lumbar radiculopathy: Patient is feeling well on gabapentin 400 mg 3 times a day.  She feels it helps keep her active reducing her lumbar radiculopathy. Prior note This is a new problem for patient. She states she has had right-sided low back pain for about one month. There was no increase in activity, lifting or known injury acutely. She states she did have an injury when she was a young child, which did not need any medical intervention, but nothing recent. However over the last month she started with right sided lower back pain, that radiated into her buttock and now is radiating down to her right foot. She reports tingling sensation intermittently in her right foot. She denies any weakness. She denies any bladder or bowel changes.   Depression screen [facility] 2/9 02/01/2020 08/10/2019 02/04/2019 05/12/2018 11/08/2017  Decreased Interest 0 0 0 0 0  Down, Depressed, Hopeless 0 0 0 0 0  PHQ - 2 Score 0 0 0 0 0  Altered sleeping 1 3 0 0 0  Tired, decreased  energy 0 0 0 0 0  Change in appetite 0 0 0 0 0  Feeling bad or failure about yourself  0 0 0 1 0  Trouble concentrating 0 0 0 0 0  Moving slowly or fidgety/restless 0 0 0 0 0  Suicidal thoughts 0 0 0 0 0  PHQ-9 Score 1 3 0 1 0  Difficult doing work/chores - Not difficult at all Not difficult at all Not difficult at all Not difficult at all   GAD 7 : Generalized Anxiety Score 02/01/2020 08/10/2019 02/04/2019 08/01/2018  Nervous, Anxious, on Edge 0 0 0 1  Control/stop worrying 1 3 0 0  Worry too much - different things 1 3 0 1  Trouble relaxing 0 0 0 0  Restless 0 0 0 0  Easily annoyed or irritable 0 0 0 0  Afraid - awful might happen 0 0 0 1  Total GAD 7 Score 2 6 0 3  Anxiety Difficulty - Not difficult at all Not difficult at all Not difficult at all    ROS: See pertinent positives and negatives per HPI.  Patient Active Problem List   Diagnosis Date Noted   Type 2 diabetes mellitus with hyperlipidemia ([place]) 11/23/2020   Body mass index (BMI) 31.0-31.9, adult 11/23/2020   On statin therapy 11/23/2020   Arthralgia of left temporomandibular joint 12/24/2018   DDD (degenerative disc disease), lumbar 09/03/2016   Lumbar radiculopathy 09/03/2016  Adjustment disorder with mixed anxiety and depressed mood 07/30/2016   Irregular menstrual cycle 03/14/2016   Prediabetes 11/02/2015   Abnormal facial hair 11/02/2015   History of colon polyps 11/02/2015   Morbid obesity ([place]) 11/02/2015    Social History   Tobacco Use   Smoking status: Former   Smokeless tobacco: Never  Substance Use Topics   Alcohol use: No    Current Outpatient Medications:    Aspirin-Acetaminophen-Caffeine (EXCEDRIN MIGRAINE PO), Take 3 tablets by mouth daily as needed (migraines.). , Disp: , Rfl:    atorvastatin (LIPITOR) 40 MG tablet, Take 1 tablet (40 mg total) by mouth daily., Disp: 90 tablet, Rfl: 3   beta carotene w/minerals (OCUVITE) tablet, Take 1 tablet by mouth daily., Disp: , Rfl:    metFORMIN  (GLUCOPHAGE) 500 MG tablet, Take 1 tablet (500 mg total) by mouth 2 (two) times daily with a meal., Disp: 180 tablet, Rfl: 1   gabapentin (NEURONTIN) 400 MG capsule, Take 1 capsule (400 mg total) by mouth 3 (three) times daily., Disp: 270 capsule, Rfl: 1   sertraline (ZOLOFT) 100 MG tablet, Take 1 tablet (100 mg total) by mouth daily., Disp: 90 tablet, Rfl: 1  Allergies  Allergen Reactions   Ibuprofen Swelling    Swelling of eyes and mouth   Meloxicam Swelling   Voltaren [Diclofenac] Swelling    OBJECTIVE: BP 112/69   Pulse 81   Temp 98.2 F (36.8 C) (Oral)   Ht '5\' 7"'$  (1.702 m)   Wt 199 lb (90.3 kg)   LMP 10/01/2018 (Approximate)   SpO2 97%   BMI 31.17 kg/m  Gen: Afebrile. No acute distress. Pleasant obese female.  HENT: AT. [place].  Eyes:Pupils Equal Round Reactive to light, Extraocular movements intact,  Conjunctiva without redness, discharge or icterus. Neck/lymp/endocrine: Supple,no lymphadenopathy, no thyromegaly CV: RRR no murmur, no edema, +2/4 P posterior tibialis pulses Chest: CTAB, no wheeze or crackles Skin: no rashes, purpura or petechiae.  Neuro:  Normal gait. PERLA. EOMi. Alert. Oriented x3 Psych: Normal affect, dress and demeanor. Normal speech. Normal thought content and judgment.   ASSESSMENT AND PLAN: [name] is a 55 y.o. female present for  Adjustment disorder with mixed anxiety and depressed mood - stable.  - continue Zoloft. - Follow-up in 5.8 months, sooner if needed.   Lumbar radiculopathy -stable.  Continue  gabapentin 400 mg 3 times daily.  - f/u 5.5 mos unless needed sooner.    Prediabetes/obesity> new onset diabetes with hyperlipidemia. -a1c today> 7.6 Start metformin 500 mg twice daily.   Discussed diabetic diet. Increase exercise. Ophthalmology referral placed today.  Patient understands to advise exam and yearly. Foot exam completed today 11/23/2020. Microalbumin collected today. Influenza vaccine updated today. Prevnar 20  administered today. Last a1c 6.3> 6.6 > 6.6> 7.6 today  Hyperlipidemia, unspecified hyperlipidemia type/ Hypertriglyceridemia/ on statin therapy.  - continue statin Fhx HD present in mother.  - diet and exercise.  - ldl, tsh, cbc, lft collected today - continue atorvastatin 20 mg daily.  May need to increase now that she is a diabetic with new goal of LDL less than 70.  Follow-up in 3 months    Orders Placed This Encounter  Procedures   Flu Vaccine QUAD 6+ mos PF IM (Fluarix Quad PF)   Pneumococcal conjugate vaccine 20-valent (Prevnar 20)   CBC w/Diff   TSH   Direct LDL   Hepatic function panel   Urine Microalbumin w/creat. ratio   Ambulatory referral to Ophthalmology   POCT HgB  A1C   Meds ordered this encounter  Medications   sertraline (ZOLOFT) 100 MG tablet    Sig: Take 1 tablet (100 mg total) by mouth daily.    Dispense:  90 tablet    Refill:  1   gabapentin (NEURONTIN) 400 MG capsule    Sig: Take 1 capsule (400 mg total) by mouth 3 (three) times daily.    Dispense:  270 capsule    Refill:  1   metFORMIN (GLUCOPHAGE) 500 MG tablet    Sig: Take 1 tablet (500 mg total) by mouth 2 (two) times daily with a meal.    Dispense:  180 tablet    Refill:  1    Referral Orders         Ambulatory referral to Ophthalmology       Howard Pouch, DO 11/23/2020

## 2020-11-24 ENCOUNTER — Telehealth: Payer: Self-pay | Admitting: Family Medicine

## 2020-11-24 DIAGNOSIS — E785 Hyperlipidemia, unspecified: Secondary | ICD-10-CM | POA: Insufficient documentation

## 2020-11-24 LAB — HEPATIC FUNCTION PANEL
AG Ratio: 1.6 (calc) (ref 1.0–2.5)
ALT: 20 U/L (ref 6–29)
AST: 15 U/L (ref 10–35)
Albumin: 4.6 g/dL (ref 3.6–5.1)
Alkaline phosphatase (APISO): 115 U/L (ref 37–153)
Bilirubin, Direct: 0.1 mg/dL (ref 0.0–0.2)
Globulin: 2.8 g/dL (calc) (ref 1.9–3.7)
Indirect Bilirubin: 0.2 mg/dL (calc) (ref 0.2–1.2)
Total Bilirubin: 0.3 mg/dL (ref 0.2–1.2)
Total Protein: 7.4 g/dL (ref 6.1–8.1)

## 2020-11-24 LAB — CBC WITH DIFFERENTIAL/PLATELET
Absolute Monocytes: 504 cells/uL (ref 200–950)
Basophils Absolute: 29 cells/uL (ref 0–200)
Basophils Relative: 0.3 %
Eosinophils Absolute: 243 cells/uL (ref 15–500)
Eosinophils Relative: 2.5 %
HCT: 42 % (ref 35.0–45.0)
Hemoglobin: 13.4 g/dL (ref 11.7–15.5)
Lymphs Abs: 1562 cells/uL (ref 850–3900)
MCH: 26.8 pg — ABNORMAL LOW (ref 27.0–33.0)
MCHC: 31.9 g/dL — ABNORMAL LOW (ref 32.0–36.0)
MCV: 84 fL (ref 80.0–100.0)
MPV: 9.4 fL (ref 7.5–12.5)
Monocytes Relative: 5.2 %
Neutro Abs: 7362 cells/uL (ref 1500–7800)
Neutrophils Relative %: 75.9 %
Platelets: 321 10*3/uL (ref 140–400)
RBC: 5 10*6/uL (ref 3.80–5.10)
RDW: 13.7 % (ref 11.0–15.0)
Total Lymphocyte: 16.1 %
WBC: 9.7 10*3/uL (ref 3.8–10.8)

## 2020-11-24 LAB — MICROALBUMIN / CREATININE URINE RATIO
Creatinine,U: 156.6 mg/dL
Microalb Creat Ratio: 1 mg/g (ref 0.0–30.0)
Microalb, Ur: 1.6 mg/dL (ref 0.0–1.9)

## 2020-11-24 LAB — LDL CHOLESTEROL, DIRECT: Direct LDL: 137 mg/dL — ABNORMAL HIGH (ref <100)

## 2020-11-24 LAB — TSH: TSH: 1.17 mIU/L

## 2020-11-24 MED ORDER — ATORVASTATIN CALCIUM 80 MG PO TABS: 80.0000 mg | ORAL_TABLET | Freq: Every day | ORAL | 3 refills | Status: DC

## 2020-11-24 MED ORDER — ATORVASTATIN CALCIUM 80 MG PO TABS: 40.0000 mg | ORAL_TABLET | Freq: Every day | ORAL | 3 refills | Status: DC

## 2020-11-24 NOTE — Telephone Encounter (Signed)
Spoke with pt regarding labs and instructions.   

## 2020-11-24 NOTE — Telephone Encounter (Signed)
Please call patient: Her liver function test, blood cell counts and thyroid function are  normal.   Her cholesterol is higher at 137.  Her goal is for an LDL less than 100 and closer to 70 now that she is a diabetic.  I have increased her cholesterol medication dose to Lipitor 80 mg before bed.  I would encourage her to start this right away at the higher dose.  We will be checking her cholesterol again at her follow-up appointment in 3 months as well as her A1c.  I would encourage her to come fasting to that appointment.

## 2020-12-08 DIAGNOSIS — M12272 Villonodular synovitis (pigmented), left ankle and foot: Secondary | ICD-10-CM | POA: Diagnosis not present

## 2020-12-08 DIAGNOSIS — M65872 Other synovitis and tenosynovitis, left ankle and foot: Secondary | ICD-10-CM | POA: Diagnosis not present

## 2020-12-08 DIAGNOSIS — M25775 Osteophyte, left foot: Secondary | ICD-10-CM | POA: Diagnosis not present

## 2020-12-08 DIAGNOSIS — M79672 Pain in left foot: Secondary | ICD-10-CM | POA: Diagnosis not present

## 2020-12-10 ENCOUNTER — Other Ambulatory Visit: Payer: BC Managed Care – PPO

## 2020-12-21 ENCOUNTER — Encounter: Payer: BC Managed Care – PPO | Admitting: Family Medicine

## 2021-01-26 ENCOUNTER — Ambulatory Visit
Admission: RE | Admit: 2021-01-26 | Discharge: 2021-01-26 | Disposition: A | Discharge status: Home / Self Care | Payer: BC Managed Care – PPO | Source: Ambulatory Visit | Attending: Family Medicine | Admitting: Family Medicine

## 2021-01-26 DIAGNOSIS — R1030 Lower abdominal pain, unspecified: Secondary | ICD-10-CM

## 2021-01-26 DIAGNOSIS — I700 Atherosclerosis of aorta: Secondary | ICD-10-CM | POA: Diagnosis not present

## 2021-01-26 DIAGNOSIS — R109 Unspecified abdominal pain: Secondary | ICD-10-CM | POA: Diagnosis not present

## 2021-03-08 ENCOUNTER — Ambulatory Visit: Payer: BC Managed Care – PPO | Admitting: Family Medicine

## 2021-03-23 ENCOUNTER — Ambulatory Visit: Payer: BC Managed Care – PPO | Admitting: Family Medicine

## 2021-04-19 ENCOUNTER — Other Ambulatory Visit: Payer: Self-pay

## 2021-04-19 ENCOUNTER — Ambulatory Visit (INDEPENDENT_AMBULATORY_CARE_PROVIDER_SITE_OTHER): Payer: BC Managed Care – PPO | Admitting: Family Medicine

## 2021-04-19 ENCOUNTER — Encounter: Payer: Self-pay | Admitting: Family Medicine

## 2021-04-19 VITALS — BP 116/81 | HR 86 | Temp 98.7°F | Ht 67.0 in | Wt 196.4 lb

## 2021-04-19 DIAGNOSIS — F4323 Adjustment disorder with mixed anxiety and depressed mood: Secondary | ICD-10-CM

## 2021-04-19 DIAGNOSIS — E1169 Type 2 diabetes mellitus with other specified complication: Secondary | ICD-10-CM

## 2021-04-19 DIAGNOSIS — M5136 Other intervertebral disc degeneration, lumbar region: Secondary | ICD-10-CM | POA: Diagnosis not present

## 2021-04-19 DIAGNOSIS — Z79899 Other long term (current) drug therapy: Secondary | ICD-10-CM | POA: Diagnosis not present

## 2021-04-19 DIAGNOSIS — M5416 Radiculopathy, lumbar region: Secondary | ICD-10-CM

## 2021-04-19 DIAGNOSIS — Z6831 Body mass index (BMI) 31.0-31.9, adult: Secondary | ICD-10-CM

## 2021-04-19 DIAGNOSIS — E785 Hyperlipidemia, unspecified: Secondary | ICD-10-CM

## 2021-04-19 DIAGNOSIS — D126 Benign neoplasm of colon, unspecified: Secondary | ICD-10-CM

## 2021-04-19 LAB — COMPREHENSIVE METABOLIC PANEL
ALT: 21 U/L (ref 0–35)
AST: 19 U/L (ref 0–37)
Albumin: 4.6 g/dL (ref 3.5–5.2)
Alkaline Phosphatase: 111 U/L (ref 39–117)
BUN: 18 mg/dL (ref 6–23)
CO2: 26 mEq/L (ref 19–32)
Calcium: 9.8 mg/dL (ref 8.4–10.5)
Chloride: 105 mEq/L (ref 96–112)
Creatinine, Ser: 0.7 mg/dL (ref 0.40–1.20)
GFR: 97.36 mL/min (ref >60.00)
Glucose, Bld: 130 mg/dL — ABNORMAL HIGH (ref 70–99)
Potassium: 4 mEq/L (ref 3.5–5.1)
Sodium: 141 mEq/L (ref 135–145)
Total Bilirubin: 0.4 mg/dL (ref 0.2–1.2)
Total Protein: 7.6 g/dL (ref 6.0–8.3)

## 2021-04-19 LAB — LIPID PANEL
Cholesterol: 215 mg/dL — ABNORMAL HIGH (ref 0–200)
HDL: 36.7 mg/dL — ABNORMAL LOW (ref >39.00)
NonHDL: 178.18
Total CHOL/HDL Ratio: 6
Triglycerides: 356 mg/dL — ABNORMAL HIGH (ref 0.0–149.0)
VLDL: 71.2 mg/dL — ABNORMAL HIGH (ref 0.0–40.0)

## 2021-04-19 LAB — LDL CHOLESTEROL, DIRECT: Direct LDL: 140 mg/dL

## 2021-04-19 LAB — POCT GLYCOSYLATED HEMOGLOBIN (HGB A1C): Hemoglobin A1C: 7 % — AB (ref 4.0–5.6)

## 2021-04-19 MED ORDER — GABAPENTIN 400 MG PO CAPS: 400.0000 mg | ORAL_CAPSULE | Freq: Three times a day (TID) | ORAL | 1 refills | Status: DC

## 2021-04-19 MED ORDER — OZEMPIC (0.25 OR 0.5 MG/DOSE) 2 MG/1.5ML ~~LOC~~ SOPN: 0.2500 mg | PEN_INJECTOR | SUBCUTANEOUS | 1 refills | Status: DC

## 2021-04-19 MED ORDER — METFORMIN HCL 500 MG PO TABS: 500.0000 mg | ORAL_TABLET | Freq: Two times a day (BID) | ORAL | 1 refills | Status: DC

## 2021-04-19 MED ORDER — SERTRALINE HCL 100 MG PO TABS: 100.0000 mg | ORAL_TABLET | Freq: Every day | ORAL | 1 refills | Status: DC

## 2021-04-19 NOTE — Patient Instructions (Addendum)
°  Great to see you today.  I have refilled the medication(s) we provide.   ONCE YOU PICK UP OZEMPIC, CALL IN FOR NURSE VISIT FOR PROPER TECHNIQUE.   If labs were collected, we will inform you of lab results once received either by echart message or telephone call.   - echart message- for normal results that have been seen by the patient already.   - telephone call: abnormal results or if patient has not viewed results in their echart.

## 2021-04-19 NOTE — Progress Notes (Signed)
Patient Care Team    Relationship Specialty Notifications Start End  [name], DO PCP - General Family Medicine  11/02/15      SUBJECTIVE Chief Complaint  Patient presents with   Follow-up    Follow up on Diabetes, would like referral for colonoscopy. Patient states that she has had trouble with sleeping.     HPI: Kristy Cooper is a  56 y.o. female.  Adjustment disorder with mixed anxiety and depressed mood Patient presents for follow-up on anxiety and depression today. She reports she is feeling well on the zoloft 100 mg Qd.  She would like to continue medication   diabetes/obesity Patient has tried to watch her diet and exercise.    patient admits she has not been exercising or watching her diet as much as she should.  Weight 199 lbs. > 196. Body mass index is 30.76 kg/m. Pt reports compliance with metformin 500 BID. Denies numbness, tingling of extremities, hypo/hyperglycemic events or non-healing wounds.   Hyperlipidemia, unspecified hyperlipidemia type/ Hypertriglyceridemia Patient has a history of hyperlipidemia and hypertriglyceridemia.fhx HD.   She is compliant with lipitor 80 mg qd.  No negative side effects reported.  Low back pain/ DDD (degenerative disc disease),/ Lumbar radiculopathy: Patient isf eeling well on gabapentin 400 mg 3 times a day.  She feels it helps keep her active reducing her lumbar radiculopathy. Prior note This is a new problem for patient. She states she has had right-sided low back pain for about one month. There was no increase in activity, lifting or known injury acutely. She states she did have an injury when she was a young child, which did not need any medical intervention, but nothing recent. However over the last month she started with right sided lower back pain, that radiated into her buttock and now is radiating down to her right foot. She reports tingling sensation intermittently in her right foot. She denies any weakness. She  denies any bladder or bowel changes.   Depression screen [facility] 2/9 04/19/2021 02/01/2020 08/10/2019 02/04/2019 05/12/2018  Decreased Interest 0 0 0 0 0  Down, Depressed, Hopeless 0 0 0 0 0  PHQ - 2 Score 0 0 0 0 0  Altered sleeping - 1 3 0 0  Tired, decreased energy - 0 0 0 0  Change in appetite - 0 0 0 0  Feeling bad or failure about yourself  - 0 0 0 1  Trouble concentrating - 0 0 0 0  Moving slowly or fidgety/restless - 0 0 0 0  Suicidal thoughts - 0 0 0 0  PHQ-9 Score - 1 3 0 1  Difficult doing work/chores - - Not difficult at all Not difficult at all Not difficult at all   GAD 7 : Generalized Anxiety Score 02/01/2020 08/10/2019 02/04/2019 08/01/2018  Nervous, Anxious, on Edge 0 0 0 1  Control/stop worrying 1 3 0 0  Worry too much - different things 1 3 0 1  Trouble relaxing 0 0 0 0  Restless 0 0 0 0  Easily annoyed or irritable 0 0 0 0  Afraid - awful might happen 0 0 0 1  Total GAD 7 Score 2 6 0 3  Anxiety Difficulty - Not difficult at all Not difficult at all Not difficult at all    ROS: See pertinent positives and negatives per HPI.  Patient Active Problem List   Diagnosis Date Noted   Hyperlipidemia LDL goal <100 11/24/2020   Type 2 diabetes mellitus with  hyperlipidemia ([place]) 11/23/2020   Body mass index (BMI) 31.0-31.9, adult 11/23/2020   On statin therapy 11/23/2020   Arthralgia of left temporomandibular joint 12/24/2018   DDD (degenerative disc disease), lumbar 09/03/2016   Lumbar radiculopathy 09/03/2016   Adjustment disorder with mixed anxiety and depressed mood 07/30/2016   Irregular menstrual cycle 03/14/2016   Abnormal facial hair 11/02/2015   History of colon polyps 11/02/2015   Morbid obesity ([place]) 11/02/2015    Social History   Tobacco Use   Smoking status: Former   Smokeless tobacco: Never  Substance Use Topics   Alcohol use: No    Current Outpatient Medications:    Aspirin-Acetaminophen-Caffeine (EXCEDRIN MIGRAINE PO), Take 3 tablets by mouth daily as  needed (migraines.). , Disp: , Rfl:    atorvastatin (LIPITOR) 80 MG tablet, Take 1 tablet (80 mg total) by mouth daily., Disp: 90 tablet, Rfl: 3   beta carotene w/minerals (OCUVITE) tablet, Take 1 tablet by mouth daily., Disp: , Rfl:    Semaglutide,0.25 or 0.5MG /DOS, (OZEMPIC, 0.25 OR 0.5 MG/DOSE,) 2 MG/1.5ML SOPN, Inject 0.25 mg into the skin once a week., Disp: 1.5 mL, Rfl: 1   gabapentin (NEURONTIN) 400 MG capsule, Take 1 capsule (400 mg total) by mouth 3 (three) times daily., Disp: 270 capsule, Rfl: 1   metFORMIN (GLUCOPHAGE) 500 MG tablet, Take 1 tablet (500 mg total) by mouth 2 (two) times daily with a meal., Disp: 180 tablet, Rfl: 1   sertraline (ZOLOFT) 100 MG tablet, Take 1 tablet (100 mg total) by mouth daily., Disp: 90 tablet, Rfl: 1  Allergies  Allergen Reactions   Ibuprofen Swelling    Swelling of eyes and mouth   Meloxicam Swelling   Voltaren [Diclofenac] Swelling    OBJECTIVE: BP 116/81 (BP Location: Left Arm, Patient Position: Sitting, Cuff Size: Normal)    Pulse 86    Temp 98.7 F (37.1 C) (Oral)    Ht 5\' 7"  (1.702 m)    Wt 196 lb 6.4 oz (89.1 kg)    LMP 10/01/2018 (Approximate)    SpO2 98%    BMI 30.76 kg/m  Physical Exam Vitals and nursing note reviewed.  Constitutional:      General: She is not in acute distress.    Appearance: Normal appearance. She is not ill-appearing, toxic-appearing or diaphoretic.  HENT:     Head: Normocephalic and atraumatic.  Eyes:     General: No scleral icterus.       Right eye: No discharge.        Left eye: No discharge.     Extraocular Movements: Extraocular movements intact.     Conjunctiva/sclera: Conjunctivae normal.     Pupils: Pupils are equal, round, and reactive to light.  Cardiovascular:     Rate and Rhythm: Normal rate and regular rhythm.  Pulmonary:     Effort: Pulmonary effort is normal. No respiratory distress.     Breath sounds: Normal breath sounds. No wheezing, rhonchi or rales.  Musculoskeletal:     Cervical  back: Neck supple. No tenderness.  Lymphadenopathy:     Cervical: No cervical adenopathy.  Skin:    General: Skin is warm and dry.     Coloration: Skin is not jaundiced or pale.     Findings: No erythema or rash.  Neurological:     Mental Status: She is alert and oriented to person, place, and time. Mental status is at baseline.     Motor: No weakness.     Gait: Gait normal.  Psychiatric:  Mood and Affect: Mood normal.        Behavior: Behavior normal.        Thought Content: Thought content normal.        Judgment: Judgment normal.      ASSESSMENT AND PLAN: Kristy Cooper is a 56 y.o. female present for  Adjustment disorder with mixed anxiety and depressed mood - stable.   - continue Zoloft. - Follow-up in 4 months, sooner if needed.   Lumbar radiculopathy -stable.  Continue   gabapentin 400 mg 3 times daily.  - f/u 71ms unless needed sooner.    new onset diabetes with hyperlipidemia. Continue metformin 500 mg twice daily> she is already having GI SE and will not tolerate a higher dose.  Ozempic 0.25 mg inj. Weekly- once she picks up 1st script she will call in for nurse visit for proper technique.  Discussed diabetic diet. Increase exercise. Ophthalmology referral placed Foot exam completed  11/23/2020. Microalbumin UTD 10/2020 Influenza vaccine UTD 2022 Prevnar 20 UTD 2022 Last a1c 6.3> 6.6 > 6.6> 7.6> 7.0 today  Hyperlipidemia, unspecified hyperlipidemia type/ Hypertriglyceridemia/ on statin therapy.  - continue statin Fhx HD present in mother.  - diet and exercise.  Labs UTD - continue atorvastatin 80 mg daily.  Lft and lipid today to recheck after increasing statin, goal < 100.  Follow-up in 4 months  Colon polyps: Referral placed to new GI for colonoscopy  that is overdue.   Orders Placed This Encounter  Procedures   Comprehensive metabolic panel   Lipid panel   Ambulatory referral to Gastroenterology   POCT glycosylated hemoglobin (Hb A1C)    Meds ordered this encounter  Medications   gabapentin (NEURONTIN) 400 MG capsule    Sig: Take 1 capsule (400 mg total) by mouth 3 (three) times daily.    Dispense:  270 capsule    Refill:  1   metFORMIN (GLUCOPHAGE) 500 MG tablet    Sig: Take 1 tablet (500 mg total) by mouth 2 (two) times daily with a meal.    Dispense:  180 tablet    Refill:  1   sertraline (ZOLOFT) 100 MG tablet    Sig: Take 1 tablet (100 mg total) by mouth daily.    Dispense:  90 tablet    Refill:  1   Semaglutide,0.25 or 0.5MG /DOS, (OZEMPIC, 0.25 OR 0.5 MG/DOSE,) 2 MG/1.5ML SOPN    Sig: Inject 0.25 mg into the skin once a week.    Dispense:  1.5 mL    Refill:  1    Referral Orders         Ambulatory referral to Gastroenterology        Howard Pouch, DO 04/19/2021

## 2021-04-27 ENCOUNTER — Encounter (HOSPITAL_COMMUNITY): Payer: Self-pay

## 2021-05-18 ENCOUNTER — Other Ambulatory Visit: Payer: Self-pay

## 2021-05-18 NOTE — Telephone Encounter (Signed)
Calling to request new prescription for blood sugar supplies.  Mail order pharmacy  - 90 d/s  [facility] mail order  Meter and supplies for Dexcom G6

## 2021-05-19 MED ORDER — DEXCOM G6 TRANSMITTER MISC: 3 refills | Status: DC

## 2021-05-19 MED ORDER — DEXCOM G6 RECEIVER DEVI
0 refills | Status: DC
Start: 2021-05-19 — End: 2021-06-12

## 2021-05-19 MED ORDER — DEXCOM G6 SENSOR MISC: 5 refills | Status: DC

## 2021-05-19 NOTE — Telephone Encounter (Signed)
Orders pended. Please advise on sig.

## 2021-06-02 LAB — HM DIABETES EYE EXAM

## 2021-06-12 ENCOUNTER — Other Ambulatory Visit: Payer: Self-pay | Admitting: Family Medicine

## 2021-06-21 ENCOUNTER — Telehealth: Payer: Self-pay | Admitting: Internal Medicine

## 2021-06-21 NOTE — Telephone Encounter (Signed)
Hi Dr. Lorenso Courier, ? ?We received a referral for patient to have another colonoscopy she had one done in 2016 op\path reports are available in Epic for you to review and advise on scheduling. ? ?Thanks ?

## 2021-06-27 ENCOUNTER — Encounter: Payer: Self-pay | Admitting: Internal Medicine

## 2021-07-06 ENCOUNTER — Telehealth: Payer: Self-pay | Admitting: Family Medicine

## 2021-07-06 NOTE — Telephone Encounter (Signed)
Caller Name: Janalynn, pt ?Call back phone #: [phone] ? ?MEDICATION(S): Semaglutide,0.25 or 0.'5MG'$ /DOS, (OZEMPIC, 0.25 OR 0.5 MG/DOSE,) 2 MG/1.5ML SOPN  ? ?Days of Med Remaining: 2 days ? ?Has the patient contacted their pharmacy (YES/NO)?  Yes, no refills ? ?Preferred Pharmacy: [address] ?

## 2021-07-10 NOTE — Telephone Encounter (Signed)
LM for pt to return call to discuss.  

## 2021-07-11 NOTE — Telephone Encounter (Signed)
Spoke with pt regarding medication and that we sent enough to last until appt. If any changes are needed pt was informed to have pharmacy contact us.  ?

## 2021-07-17 MED ORDER — SEMAGLUTIDE(0.25 OR 0.5MG/DOS) 2 MG/3ML ~~LOC~~ SOPN
0.5000 mg | PEN_INJECTOR | SUBCUTANEOUS | 1 refills | Status: DC
Start: 2021-07-17 — End: 2021-08-17

## 2021-07-17 NOTE — Telephone Encounter (Signed)
Called pharmacy and confirmed that they have received the last script and pt had picked up medication and refill already. Please advise if script is a 16 wk supply and if pt needs NV to make sure she is administering correctly. Pharm stated ins may stop next refill if too soon.  ?

## 2021-07-17 NOTE — Telephone Encounter (Signed)
Patient states she is following up on refill request.  [facility] [address] states they do not have prescription.  Patient states this was supposed to be called in weeks ago.  Patient can be reached at [phone] ? ?Please submit refill Semaglutide,0.25 or 0.'5MG'$ /DOS, (OZEMPIC, 0.25 OR 0.5 MG/DOSE,) 2 MG/1.5ML SOPN [[id number]]  ?

## 2021-07-17 NOTE — Addendum Note (Signed)
Addended by: Howard Pouch A on: 07/17/2021 04:50 PM ? ? Modules accepted: Orders ? ?

## 2021-07-17 NOTE — Telephone Encounter (Signed)
Called in refills on Ozempic.  New dose is 0.5 mg weekly injection. ?I have called in the larger 3 mL syringe instead of the 1.5 mL (since the manufacturer's discontinuing the 1.5 mL syringe). ?

## 2021-07-18 NOTE — Telephone Encounter (Signed)
Pt informed

## 2021-07-19 ENCOUNTER — Ambulatory Visit (AMBULATORY_SURGERY_CENTER): Payer: BC Managed Care – PPO

## 2021-07-19 VITALS — Ht 67.0 in | Wt 192.0 lb

## 2021-07-19 DIAGNOSIS — Z1211 Encounter for screening for malignant neoplasm of colon: Secondary | ICD-10-CM

## 2021-07-19 DIAGNOSIS — Z860100 Personal history of colon polyps, unspecified: Secondary | ICD-10-CM

## 2021-07-19 MED ORDER — NA SULFATE-K SULFATE-MG SULF 17.5-3.13-1.6 GM/177ML PO SOLN: 1.0000 | Freq: Once | ORAL | 0 refills | Status: AC

## 2021-07-19 NOTE — Progress Notes (Signed)
No egg or soy allergy known to patient  ?No issues known to pt with past sedation with any surgeries or procedures ?Patient denies ever being told they had issues or difficulty with intubation  ?No FH of Malignant Hyperthermia ?Pt is not on diet pills ?Pt is not on home 02  ?Pt is not on blood thinners  ?Pt denies issues with constipation;  ?No A fib or A flutter ?NO PA's for preps discussed with pt in PV today  ?Discussed with pt there will be an out-of-pocket cost for prep and that varies from $0 to 70 + dollars - pt verbalized understanding  ?Pt instructed to use Singlecare.com or GoodRx for a price reduction on prep  ?PV completed over the phone. Pt verified name, DOB, address and insurance during PV today.  ?Pt mailed instruction packet with copy of consent form to read and not return, and instructions.  ?Pt encouraged to call with questions or issues.  ?If pt has My chart, procedure instructions sent via My Chart  ? ?

## 2021-07-25 ENCOUNTER — Encounter: Payer: Self-pay | Admitting: Internal Medicine

## 2021-08-02 ENCOUNTER — Ambulatory Visit (AMBULATORY_SURGERY_CENTER): Payer: BC Managed Care – PPO | Admitting: Internal Medicine

## 2021-08-02 ENCOUNTER — Encounter: Payer: Self-pay | Admitting: Internal Medicine

## 2021-08-02 VITALS — BP 114/72 | HR 69 | Temp 98.7°F | Resp 16 | Ht 67.0 in | Wt 192.0 lb

## 2021-08-02 DIAGNOSIS — D123 Benign neoplasm of transverse colon: Secondary | ICD-10-CM | POA: Diagnosis not present

## 2021-08-02 DIAGNOSIS — Z1211 Encounter for screening for malignant neoplasm of colon: Secondary | ICD-10-CM

## 2021-08-02 DIAGNOSIS — D128 Benign neoplasm of rectum: Secondary | ICD-10-CM | POA: Diagnosis not present

## 2021-08-02 DIAGNOSIS — Z86010 Personal history of colonic polyps: Secondary | ICD-10-CM | POA: Diagnosis not present

## 2021-08-02 DIAGNOSIS — D122 Benign neoplasm of ascending colon: Secondary | ICD-10-CM

## 2021-08-02 MED ORDER — SODIUM CHLORIDE 0.9 % IV SOLN: 500.0000 mL | Freq: Once | INTRAVENOUS | Status: DC

## 2021-08-02 NOTE — Progress Notes (Signed)
VS completed by DT.  Pt's states no medical or surgical changes since previsit or office visit.  

## 2021-08-02 NOTE — Progress Notes (Signed)
Called to room to assist during endoscopic procedure.  Patient ID and intended procedure confirmed with present staff. Received instructions for my participation in the procedure from the performing physician.  

## 2021-08-02 NOTE — Patient Instructions (Signed)
YOU HAD AN ENDOSCOPIC PROCEDURE TODAY AT THE [facility]:   Refer to the procedure report that was given to you for any specific questions about what was found during the examination.  If the procedure report does not answer your questions, please call your gastroenterologist to clarify.  If you requested that your care partner not be given the details of your procedure findings, then the procedure report has been included in a sealed envelope for you to review at your convenience later.  YOU SHOULD EXPECT: Some feelings of bloating in the abdomen. Passage of more gas than usual.  Walking can help get rid of the air that was put into your GI tract during the procedure and reduce the bloating. If you had a lower endoscopy (such as a colonoscopy or flexible sigmoidoscopy) you may notice spotting of blood in your stool or on the toilet paper. If you underwent a bowel prep for your procedure, you may not have a normal bowel movement for a few days.  Please Note:  You might notice some irritation and congestion in your nose or some drainage.  This is from the oxygen used during your procedure.  There is no need for concern and it should clear up in a day or so.  SYMPTOMS TO REPORT IMMEDIATELY:   Following lower endoscopy (colonoscopy or flexible sigmoidoscopy):  Excessive amounts of blood in the stool  Significant tenderness or worsening of abdominal pains  Swelling of the abdomen that is new, acute  Fever of 100F or higher  For urgent or emergent issues, a gastroenterologist can be reached at any hour by calling [phone]. Do not use MyChart messaging for urgent concerns.    DIET:  We do recommend a small meal at first, but then you may proceed to your regular diet.  Drink plenty of fluids but you should avoid alcoholic beverages for 24 hours.  ACTIVITY:  You should plan to take it easy for the rest of today and you should NOT DRIVE or use heavy machinery until tomorrow (because  of the sedation medicines used during the test).    FOLLOW UP: Our staff will call the number listed on your records 48-72 hours following your procedure to check on you and address any questions or concerns that you may have regarding the information given to you following your procedure. If we do not reach you, we will leave a message.  We will attempt to reach you two times.  During this call, we will ask if you have developed any symptoms of COVID 19. If you develop any symptoms (ie: fever, flu-like symptoms, shortness of breath, cough etc.) before then, please call [phone].  If you test positive for Covid 19 in the 2 weeks post procedure, please call and report this information to us.    If any biopsies were taken you will be contacted by phone or by letter within the next 1-3 weeks.  Please call us at [phone] if you have not heard about the biopsies in 3 weeks.    SIGNATURES/CONFIDENTIALITY: You and/or your care partner have signed paperwork which will be entered into your electronic medical record.  These signatures attest to the fact that that the information above on your After Visit Summary has been reviewed and is understood.  Full responsibility of the confidentiality of this discharge information lies with you and/or your care-partner. 

## 2021-08-02 NOTE — Progress Notes (Signed)
? ?GASTROENTEROLOGY PROCEDURE H&P NOTE  ? ?Primary Care Physician: ?Kuneff, Renee A, DO ? ? ? ?Reason for Procedure:   Colon cancer screening ? ?Plan:    Colonoscopy ? ?Patient is appropriate for endoscopic procedure(s) in the ambulatory ([place]) setting. ? ?The nature of the procedure, as well as the risks, benefits, and alternatives were carefully and thoroughly reviewed with the patient. Ample time for discussion and questions allowed. The patient understood, was satisfied, and agreed to proceed.  ? ? ? ?HPI: ?Kristy Cooper is a 56 y.o. female who presents for colonoscopy for colon cancer screening. Denies blood in stools, changes in bowel habits, weight loss. Had two aunts and one uncle who had colon cancer. Mother had colon polyps. ? ?Past Medical History:  ?Diagnosis Date  ? Bilateral foot pain   ? chronic- told arthritis and was prescribed tramadol.   ? Colon polyp 2014  ? tubular adenoma  ? Depression   ? Depression with anxiety   ? on meds  ? Diabetes mellitus without complication ([place])   ? on meds  ? GERD (gastroesophageal reflux disease)   ? hx of-diet controlled  ? Hyperlipidemia LDL goal <130   ? on meds  ? Left breast mass   ? Obesity   ? Prediabetes   ? Right rotator cuff tendinitis 2013  ? Sciatic leg pain 2019  ? right leg  ? ? ?Past Surgical History:  ?Procedure Laterality Date  ? BREAST LUMPECTOMY WITH RADIOACTIVE SEED LOCALIZATION Left 11/25/2019  ? Procedure: RADIOACTIVE SEED GUIDED LEFT BREAST LUMPECTOMY;  Surgeon: Coralie Keens, MD;  Location: [facility];  Service: General;  Laterality: Left;  LMA  ? CESAREAN SECTION    ? COLONOSCOPY  2016  ? [facility]-prep exc-TA  ? POLYPECTOMY  2016  ? TA  ? ? ?Prior to Admission medications   ?Medication Sig Start Date End Date Taking? Authorizing Provider  ?Aspirin-Acetaminophen-Caffeine (EXCEDRIN MIGRAINE PO) Take 3 tablets by mouth daily as needed (migraines.).    Yes [provider]  ?atorvastatin (LIPITOR) 80 MG  tablet Take 1 tablet (80 mg total) by mouth daily. 11/24/20  Yes Kuneff, Renee A, DO  ?Continuous Blood Gluc Receiver (DEXCOM G6 RECEIVER) DEVI USE TO MONITOR BLOOD       GLUCOSE 3 TIMES A DAY 06/12/21  Yes Kuneff, Renee A, DO  ?Continuous Blood Gluc Sensor (DEXCOM G6 SENSOR) MISC Monitor blood glucose TID 05/19/21  Yes Kuneff, Renee A, DO  ?Continuous Blood Gluc Transmit (DEXCOM G6 TRANSMITTER) MISC Monitor blood glucose TID 05/19/21  Yes Kuneff, Renee A, DO  ?gabapentin (NEURONTIN) 400 MG capsule Take 1 capsule (400 mg total) by mouth 3 (three) times daily. 04/19/21  Yes Kuneff, Renee A, DO  ?metFORMIN (GLUCOPHAGE) 500 MG tablet Take 1 tablet (500 mg total) by mouth 2 (two) times daily with a meal. 04/19/21  Yes Kuneff, Renee A, DO  ?Semaglutide,0.25 or 0.5MG/DOS, (OZEMPIC, 0.25 OR 0.5 MG/DOSE,) 2 MG/1.5ML SOPN Inject 0.25 mg into the skin once a week. 04/19/21  Yes Kuneff, Renee A, DO  ?Semaglutide,0.25 or 0.5MG/DOS, 2 MG/3ML SOPN Inject 0.5 mg into the skin once a week. 07/17/21  Yes Kuneff, Renee A, DO  ?sertraline (ZOLOFT) 100 MG tablet Take 1 tablet (100 mg total) by mouth daily. 04/19/21  Yes Kuneff, Renee A, DO  ?beta carotene w/minerals (OCUVITE) tablet Take 1 tablet by mouth daily.    [provider]  ? ? ?Current Outpatient Medications  ?Medication Sig Dispense Refill  ? Aspirin-Acetaminophen-Caffeine (  EXCEDRIN MIGRAINE PO) Take 3 tablets by mouth daily as needed (migraines.).     ? atorvastatin (LIPITOR) 80 MG tablet Take 1 tablet (80 mg total) by mouth daily. 90 tablet 3  ? Continuous Blood Gluc Receiver (DEXCOM G6 RECEIVER) DEVI USE TO MONITOR BLOOD       GLUCOSE 3 TIMES A DAY 1 each 0  ? Continuous Blood Gluc Sensor (DEXCOM G6 SENSOR) MISC Monitor blood glucose TID 3 each 5  ? Continuous Blood Gluc Transmit (DEXCOM G6 TRANSMITTER) MISC Monitor blood glucose TID 1 each 3  ? gabapentin (NEURONTIN) 400 MG capsule Take 1 capsule (400 mg total) by mouth 3 (three) times daily. 270 capsule 1  ? metFORMIN  (GLUCOPHAGE) 500 MG tablet Take 1 tablet (500 mg total) by mouth 2 (two) times daily with a meal. 180 tablet 1  ? Semaglutide,0.25 or 0.5MG/DOS, (OZEMPIC, 0.25 OR 0.5 MG/DOSE,) 2 MG/1.5ML SOPN Inject 0.25 mg into the skin once a week. 1.5 mL 1  ? Semaglutide,0.25 or 0.5MG/DOS, 2 MG/3ML SOPN Inject 0.5 mg into the skin once a week. 3 mL 1  ? sertraline (ZOLOFT) 100 MG tablet Take 1 tablet (100 mg total) by mouth daily. 90 tablet 1  ? beta carotene w/minerals (OCUVITE) tablet Take 1 tablet by mouth daily.    ? ?Current Facility-Administered Medications  ?Medication Dose Route Frequency Provider Last Rate Last Admin  ? 0.9 %  sodium chloride infusion  500 mL Intravenous Once Sharyn Creamer, MD      ? ? ?Allergies as of 08/02/2021 - Review Complete 08/02/2021  ?Allergen Reaction Noted  ? Ibuprofen Swelling 11/25/2019  ? Meloxicam Swelling 09/03/2016  ? Voltaren [diclofenac] Swelling 11/22/2016  ? ? ?Family History  ?Problem Relation Age of Onset  ? Colon polyps Mother 24  ? Breast cancer Mother   ? Diabetes Mother   ? Mental illness Mother   ? Heart disease Mother   ? Arthritis Mother   ? Arthritis Father   ? Mental illness Father   ? Colon cancer Maternal Aunt 71  ? Colon polyps Maternal Aunt 31  ? Breast cancer Maternal Aunt   ? Diabetes Maternal Aunt   ? Arthritis Maternal Aunt   ? Colon polyps Maternal Uncle   ? Colon cancer Maternal Uncle   ? Diabetes Maternal Uncle   ? Colon polyps Paternal Aunt   ? Colon cancer Paternal Aunt   ? Diabetes Paternal Aunt   ? Heart disease Paternal Aunt   ? Esophageal cancer Neg Hx   ? Stomach cancer Neg Hx   ? Rectal cancer Neg Hx   ? ? ?Social History  ? ?Socioeconomic History  ? Marital status: Married  ?  Spouse name: Not on file  ? Number of children: Not on file  ? Years of education: Not on file  ? Highest education level: GED or equivalent  ?Occupational History  ? Not on file  ?Tobacco Use  ? Smoking status: Former  ? Smokeless tobacco: Never  ?Vaping Use  ? Vaping Use:  Never used  ?Substance and Sexual Activity  ? Alcohol use: Yes  ?  Comment: special occasions  ? Drug use: No  ? Sexual activity: Yes  ?  Partners: Male  ?  Birth control/protection: None, Post-menopausal  ?  Comment: married  ?Other Topics Concern  ? Not on file  ?Social History Narrative  ? Married to [place]. 2 chilrn Bubba Hales)- special needs and an adult child. She also has a grandchild.   ?  HS grad. Artist.  ? Drinks caffeine. Takes daily vitamin.  ? Wears seatbelt. Wears bicycle helmet. Smoke detector in the home.   ? Exercises routinely.   ? Feels safe in relationships.   ?   ?    ? ?Social Determinants of Health  ? ?Financial Resource Strain: Medium Risk  ? Difficulty of Paying Living Expenses: Somewhat hard  ?Food Insecurity: Food Insecurity Present  ? Worried About Charity fundraiser in the Last Year: Sometimes true  ? Ran Out of Food in the Last Year: Never true  ?Transportation Needs: No Transportation Needs  ? Lack of Transportation (Medical): No  ? Lack of Transportation (Non-Medical): No  ?Physical Activity: Sufficiently Active  ? Days of Exercise per Week: 3 days  ? Minutes of Exercise per Session: 90 min  ?Stress: Stress Concern Present  ? Feeling of Stress : To some extent  ?Social Connections: Unknown  ? Frequency of Communication with Friends and Family: Once a week  ? Frequency of Social Gatherings with Friends and Family: More than three times a week  ? Attends Religious Services: Patient refused  ? Active Member of Clubs or Organizations: No  ? Attends Archivist Meetings: Not on file  ? Marital Status: Married  ?Intimate Partner Violence: Not on file  ? ? ?Physical Exam: ?Vital signs in last 24 hours: ?BP 115/73   Pulse 74   Temp 98.7 ?F (37.1 ?C) (Temporal)   Ht _0  (1.702 m)   Wt 192 lb (87.1 kg)   LMP 10/01/2018 (Approximate)   SpO2 98%   BMI 30.07 kg/m?  ?GEN: NAD ?EYE: Sclerae anicteric ?ENT: MMM ?CV: Non-tachycardic ?Pulm: No increased work of breathing ?GI: Soft,  NT/ND ?NEURO:  Alert & Oriented ? ? ?Christia Reading, MD ?[facility] ? ?08/02/2021 10:05 AM ? ?

## 2021-08-02 NOTE — Op Note (Signed)
[facility] ?Patient Name: Kristy Cooper ?Procedure Date: 08/02/2021 10:07 AM ?MRN: [id number] ?Endoscopist: Sonny Masters "Christia Reading ,  ?Age: 56 ?Referring MD:  ?Date of Birth: May 03, 1965 ?Gender: Female ?Account #: [id number] ?Procedure:                Colonoscopy ?Indications:              High risk colon cancer surveillance: Personal  ?                          history of colonic polyps ?Medicines:                Monitored Anesthesia Care ?Procedure:                Pre-Anesthesia Assessment: ?                          - Prior to the procedure, a History and Physical  ?                          was performed, and patient medications and  ?                          allergies were reviewed. The patient's tolerance of  ?                          previous anesthesia was also reviewed. The risks  ?                          and benefits of the procedure and the sedation  ?                          options and risks were discussed with the patient.  ?                          All questions were answered, and informed consent  ?                          was obtained. Prior Anticoagulants: The patient has  ?                          taken no previous anticoagulant or antiplatelet  ?                          agents. ASA Grade Assessment: II - A patient with  ?                          mild systemic disease. After reviewing the risks  ?                          and benefits, the patient was deemed in  ?                          satisfactory condition to undergo the procedure. ?  After obtaining informed consent, the colonoscope  ?                          was passed under direct vision. Throughout the  ?                          procedure, the patient's blood pressure, pulse, and  ?                          oxygen saturations were monitored continuously. The  ?                          Colonoscope was introduced through the anus and  ?                          advanced to the the terminal  ileum. The colonoscopy  ?                          was performed without difficulty. The patient  ?                          tolerated the procedure well. The quality of the  ?                          bowel preparation was good. The terminal ileum,  ?                          ileocecal valve, appendiceal orifice, and rectum  ?                          were photographed. ?Scope In: 10:15:52 AM ?Scope Out: 10:36:45 AM ?Scope Withdrawal Time: 0 hours 15 minutes 44 seconds  ?Total Procedure Duration: 0 hours 20 minutes 53 seconds  ?Findings:                 The terminal ileum appeared normal. ?                          Three sessile polyps were found in the transverse  ?                          colon and ascending colon. The polyps were 3 to 6  ?                          mm in size. These polyps were removed with a cold  ?                          snare. Resection and retrieval were complete. ?                          Two sessile polyps were found in the rectum. The  ?                          polyps were 3 to 4 mm  in size. These polyps were  ?                          removed with a cold snare. Resection and retrieval  ?                          were complete. ?                          Non-bleeding internal hemorrhoids were found during  ?                          retroflexion. ?Complications:            No immediate complications. ?Estimated Blood Loss:     Estimated blood loss was minimal. ?Impression:               - The examined portion of the ileum was normal. ?                          - Three 3 to 6 mm polyps in the transverse colon  ?                          and in the ascending colon, removed with a cold  ?                          snare. Resected and retrieved. ?                          - Two 3 to 4 mm polyps in the rectum, removed with  ?                          a cold snare. Resected and retrieved. ?                          - Non-bleeding internal hemorrhoids. ?Recommendation:           - Discharge  patient to home (with escort). ?                          - Await pathology results. ?                          - The findings and recommendations were discussed  ?                          with the patient. ?Georgian Co,  ?08/02/2021 10:49:24 AM ?

## 2021-08-04 ENCOUNTER — Telehealth: Payer: Self-pay

## 2021-08-04 NOTE — Telephone Encounter (Signed)
?  Follow up Call- ? ? ?  08/02/2021  ?  9:54 AM  ?Call back number  ?Post procedure Call Back phone  # [phone]  ?Permission to leave phone message Yes  ?  ? ?Patient questions: ? ?Do you have a fever, pain , or abdominal swelling? No. ?Pain Score  0 * ? ?Have you tolerated food without any problems? Yes ? ?Have you been able to return to your normal activities? Yes.   ? ?Do you have any questions about your discharge instructions: ?Diet   No. ?Medications  No. ?Follow up visit  No. ? ?Do you have questions or concerns about your Care? No. ? ?Actions: ?* If pain score is 4 or above: ?No action needed, pain <4. ? ? ?

## 2021-08-08 ENCOUNTER — Encounter: Payer: Self-pay | Admitting: Internal Medicine

## 2021-08-15 IMAGING — MG MM PLC BREAST LOC DEV 1ST LESION INC MAMMO GUIDE*L*
8 series · 8 of 8 positions shown · non-contrast
Comparison: Previous exam(s).

CLINICAL DATA: Patient presents for seed localization prior to LEFT
lumpectomy. Recent stereotactic guided core biopsy shows flat
epithelial atypia with focal lobular neoplasia (atypical lobular
hyperplasia) of the LEFT breast LOWER OUTER QUADRANT.

EXAM:
MAMMOGRAPHIC GUIDED RADIOACTIVE SEED LOCALIZATION OF THE LEFT BREAST

[L LM (1 of 4)]
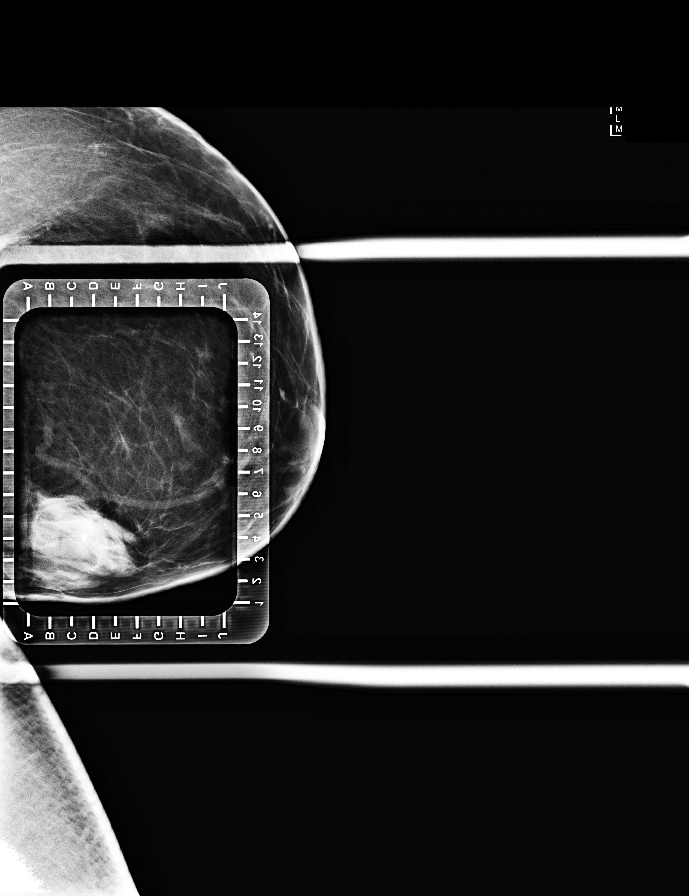

[L LM (2 of 4)]
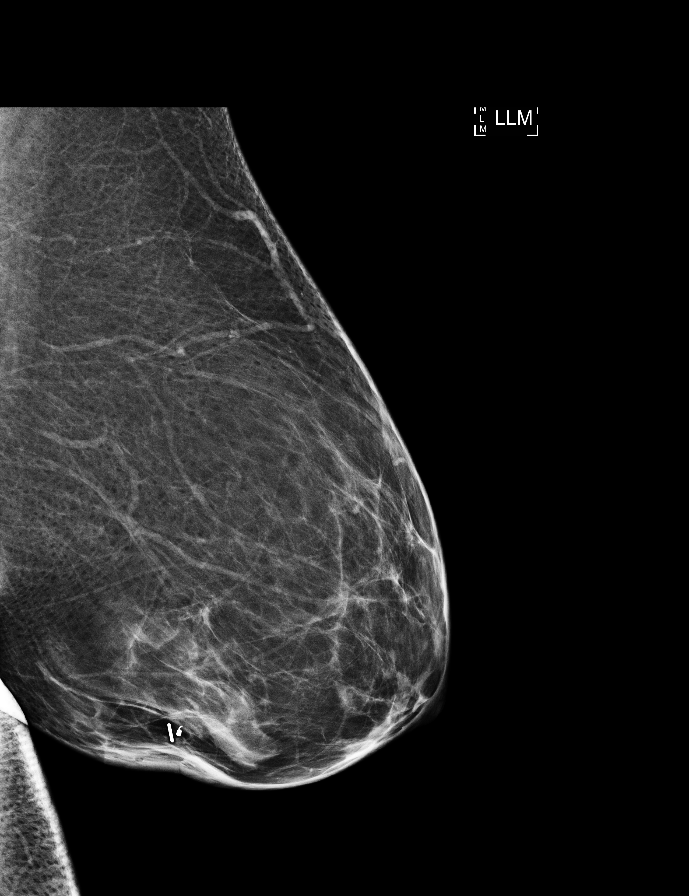

[L LM (3 of 4)]
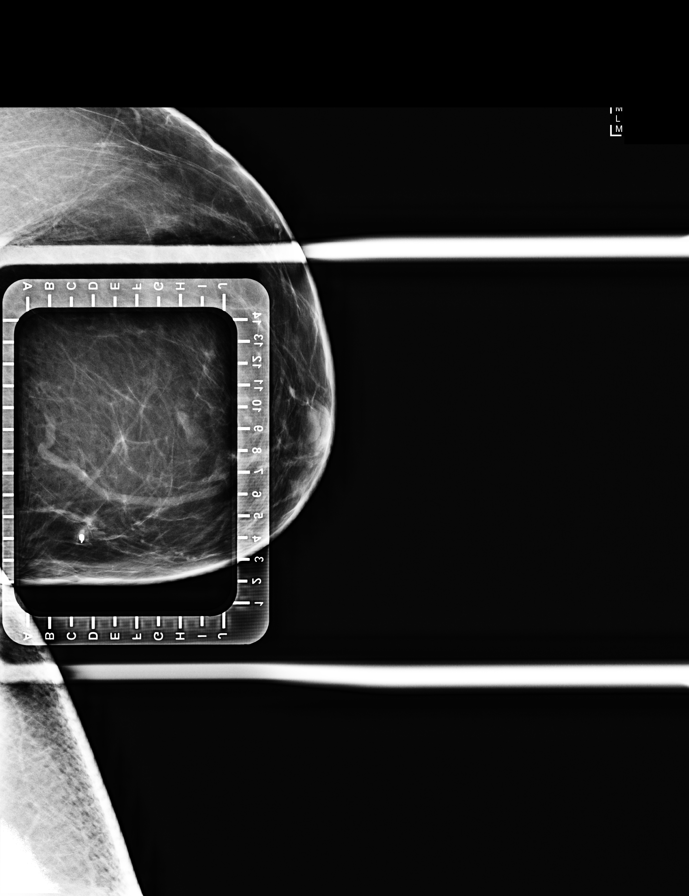

[L LM (4 of 4)]
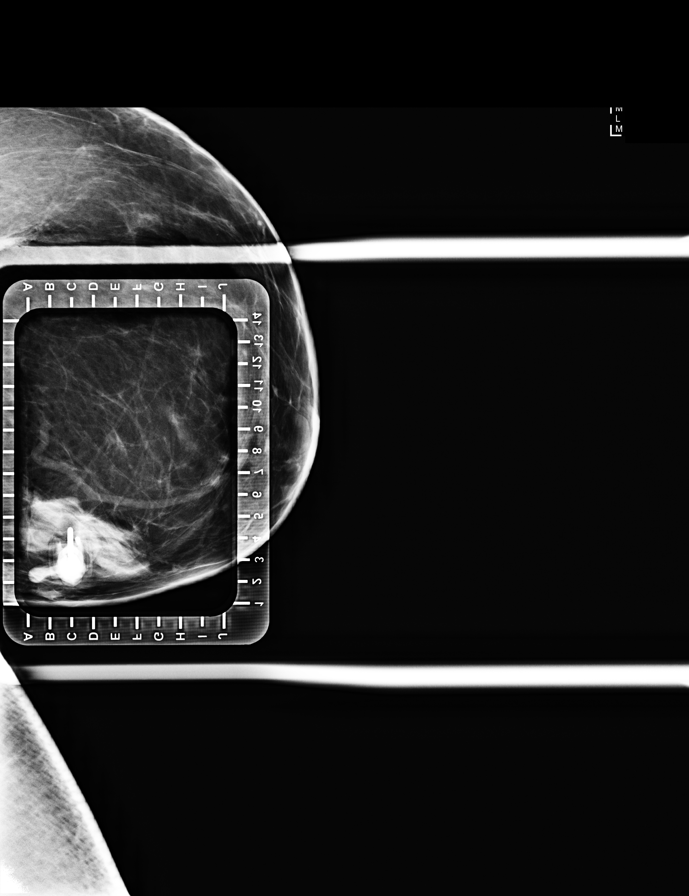

[L CC (1 of 4)]
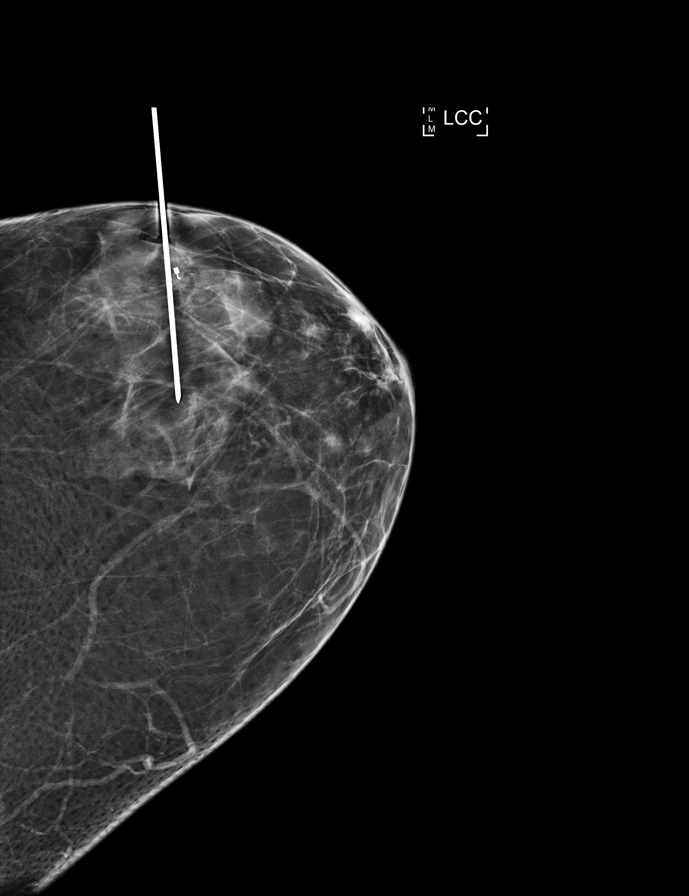

[L CC (2 of 4)]
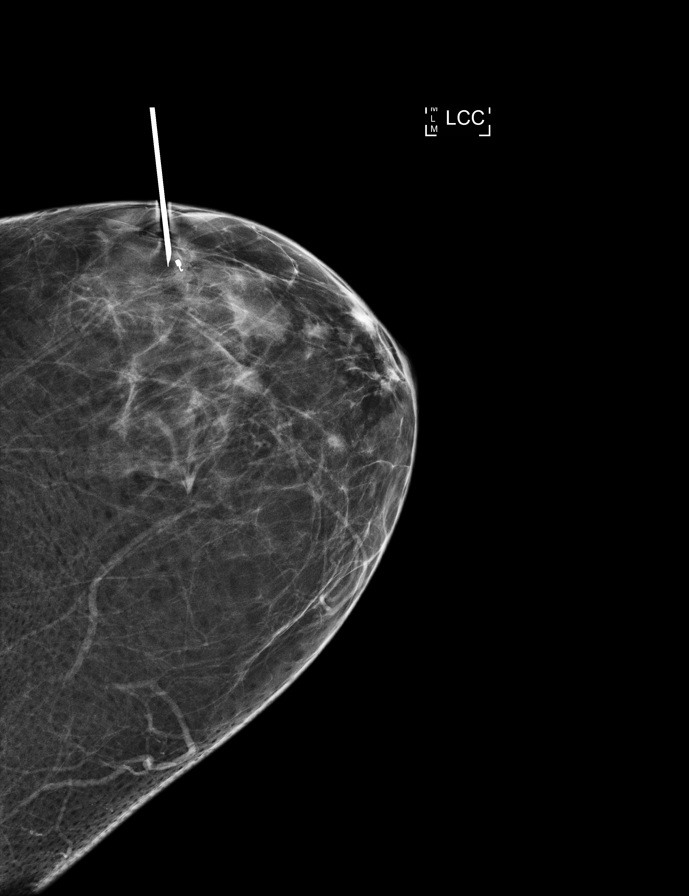

[L CC (3 of 4)]
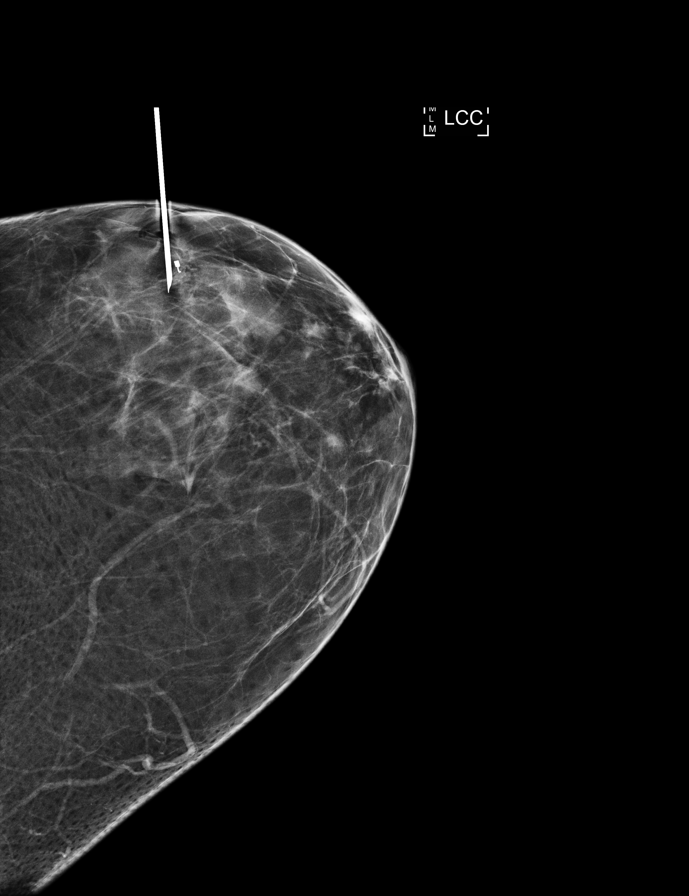

[L CC (4 of 4)]
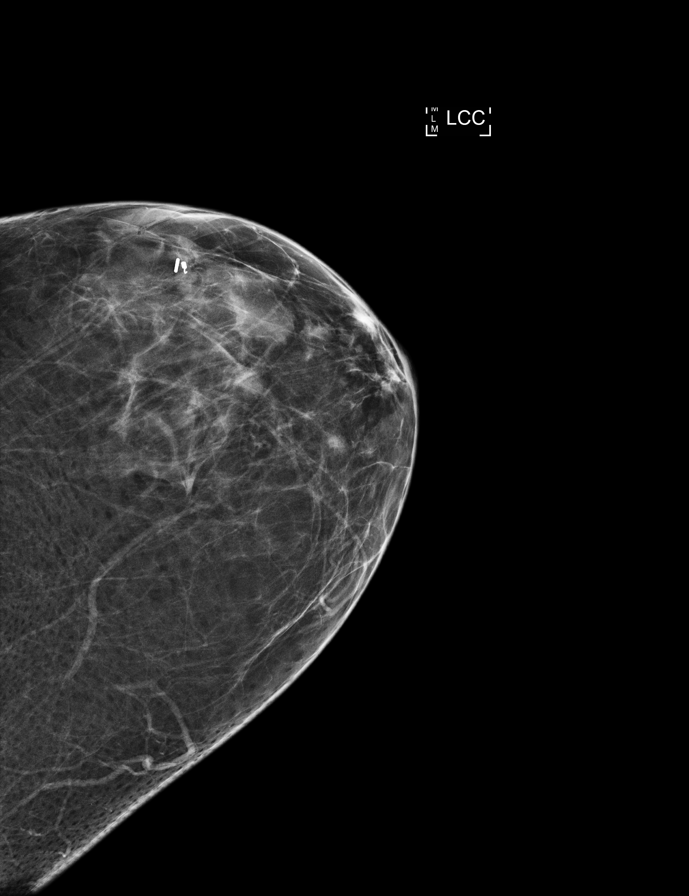

[8 of 8 positions shown; findings below may reference images not displayed]

FINDINGS: Patient presents for radioactive seed localization prior to
lumpectomy. I met with the patient and we discussed the procedure of
seed localization including benefits and alternatives. We discussed
the high likelihood of a successful procedure. We discussed the
risks of the procedure including infection, bleeding, tissue injury
and further surgery. We discussed the low dose of radioactivity
involved in the procedure. Informed, written consent was given.

The usual time-out protocol was performed immediately prior to the
procedure.

Using mammographic guidance, sterile technique, 1% lidocaine and an
8-QP3 radioactive seed, the coil shaped clip in the LOWER OUTER
QUADRANT of the LEFT breast was localized using a LATERAL to MEDIAL
approach. The follow-up mammogram images confirm the seed in the
expected location and were marked for Dr. Blondinacka.

Follow-up survey of the patient confirms presence of the radioactive
seed.

Order number of 8-QP3 seed:  [id number].

Total activity:  0.254 millicurie reference Date: 11/02/2019

The patient tolerated the procedure well and was released from the
[REDACTED]. She was given instructions regarding seed removal.
IMPRESSION: Radioactive seed localization left breast. No apparent
complications.

## 2021-08-17 ENCOUNTER — Encounter: Payer: Self-pay | Admitting: Family Medicine

## 2021-08-17 ENCOUNTER — Ambulatory Visit (INDEPENDENT_AMBULATORY_CARE_PROVIDER_SITE_OTHER): Payer: BC Managed Care – PPO | Admitting: Family Medicine

## 2021-08-17 VITALS — BP 114/72 | HR 87 | Temp 98.0°F | Ht 67.0 in | Wt 195.0 lb

## 2021-08-17 DIAGNOSIS — E1169 Type 2 diabetes mellitus with other specified complication: Secondary | ICD-10-CM

## 2021-08-17 DIAGNOSIS — F4323 Adjustment disorder with mixed anxiety and depressed mood: Secondary | ICD-10-CM

## 2021-08-17 DIAGNOSIS — Z6830 Body mass index (BMI) 30.0-30.9, adult: Secondary | ICD-10-CM

## 2021-08-17 DIAGNOSIS — M51369 Other intervertebral disc degeneration, lumbar region without mention of lumbar back pain or lower extremity pain: Secondary | ICD-10-CM

## 2021-08-17 DIAGNOSIS — M5416 Radiculopathy, lumbar region: Secondary | ICD-10-CM | POA: Diagnosis not present

## 2021-08-17 DIAGNOSIS — E785 Hyperlipidemia, unspecified: Secondary | ICD-10-CM | POA: Diagnosis not present

## 2021-08-17 DIAGNOSIS — M5136 Other intervertebral disc degeneration, lumbar region: Secondary | ICD-10-CM | POA: Diagnosis not present

## 2021-08-17 DIAGNOSIS — Z79899 Other long term (current) drug therapy: Secondary | ICD-10-CM

## 2021-08-17 LAB — MICROALBUMIN / CREATININE URINE RATIO
Creatinine,U: 164.4 mg/dL
Microalb Creat Ratio: 2 mg/g (ref 0.0–30.0)
Microalb, Ur: 3.3 mg/dL — ABNORMAL HIGH (ref 0.0–1.9)

## 2021-08-17 LAB — POCT GLYCOSYLATED HEMOGLOBIN (HGB A1C)
HbA1c POC (<> result, manual entry): 6.5 % (ref 4.0–5.6)
HbA1c, POC (controlled diabetic range): 6.5 % (ref 0.0–7.0)
HbA1c, POC (prediabetic range): 6.5 % — AB (ref 5.7–6.4)
Hemoglobin A1C: 6.5 % — AB (ref 4.0–5.6)

## 2021-08-17 MED ORDER — SERTRALINE HCL 100 MG PO TABS: 100.0000 mg | ORAL_TABLET | Freq: Every day | ORAL | 1 refills | Status: DC

## 2021-08-17 MED ORDER — METFORMIN HCL 500 MG PO TABS: 500.0000 mg | ORAL_TABLET | Freq: Two times a day (BID) | ORAL | 1 refills | Status: DC

## 2021-08-17 MED ORDER — GABAPENTIN 400 MG PO CAPS: 400.0000 mg | ORAL_CAPSULE | Freq: Three times a day (TID) | ORAL | 1 refills | Status: DC

## 2021-08-17 MED ORDER — INSULIN PEN NEEDLE 32G X 6 MM MISC: 3 refills | Status: DC

## 2021-08-17 MED ORDER — LIRAGLUTIDE 18 MG/3ML ~~LOC~~ SOPN: 1.2000 mg | PEN_INJECTOR | Freq: Every day | SUBCUTANEOUS | 5 refills | Status: DC

## 2021-08-17 NOTE — Patient Instructions (Signed)
No follow-ups on file.        Great to see you today.  I have refilled the medication(s) we provide.   If labs were collected, we will inform you of lab results once received either by echart message or telephone call.   - echart message- for normal results that have been seen by the patient already.   - telephone call: abnormal results or if patient has not viewed results in their echart.   Diabetes Mellitus and Nutrition, Adult When you have diabetes, or diabetes mellitus, it is very important to have healthy eating habits because your blood sugar (glucose) levels are greatly affected by what you eat and drink. Eating healthy foods in the right amounts, at about the same times every day, can help you: Manage your blood glucose. Lower your risk of heart disease. Improve your blood pressure. Reach or maintain a healthy weight. What can affect my meal plan? Every person with diabetes is different, and each person has different needs for a meal plan. Your health care provider may recommend that you work with a dietitian to make a meal plan that is best for you. Your meal plan may vary depending on factors such as: The calories you need. The medicines you take. Your weight. Your blood glucose, blood pressure, and cholesterol levels. Your activity level. Other health conditions you have, such as heart or kidney disease. How do carbohydrates affect me? Carbohydrates, also called carbs, affect your blood glucose level more than any other type of food. Eating carbs raises the amount of glucose in your blood. It is important to know how many carbs you can safely have in each meal. This is different for every person. Your dietitian can help you calculate how many carbs you should have at each meal and for each snack. How does alcohol affect me? Alcohol can cause a decrease in blood glucose (hypoglycemia), especially if you use insulin or take certain diabetes medicines by mouth. Hypoglycemia  can be a life-threatening condition. Symptoms of hypoglycemia, such as sleepiness, dizziness, and confusion, are similar to symptoms of having too much alcohol. Do not drink alcohol if: Your health care provider tells you not to drink. You are pregnant, may be pregnant, or are planning to become pregnant. If you drink alcohol: Limit how much you have to: 0-1 drink a day for women. 0-2 drinks a day for men. Know how much alcohol is in your drink. In the [place] one drink equals one 12 oz bottle of beer (355 mL), one 5 oz glass of wine (148 mL), or one 1 oz glass of hard liquor (44 mL). Keep yourself hydrated with water, diet soda, or unsweetened iced tea. Keep in mind that regular soda, juice, and other mixers may contain a lot of sugar and must be counted as carbs. What are tips for following this plan?  Reading food labels Start by checking the serving size on the Nutrition Facts label of packaged foods and drinks. The number of calories and the amount of carbs, fats, and other nutrients listed on the label are based on one serving of the item. Many items contain more than one serving per package. Check the total grams (g) of carbs in one serving. Check the number of grams of saturated fats and trans fats in one serving. Choose foods that have a low amount or none of these fats. Check the number of milligrams (mg) of salt (sodium) in one serving. Most people should limit total sodium intake to  less than 2,300 mg per day. Always check the nutrition information of foods labeled as "low-fat" or "nonfat." These foods may be higher in added sugar or refined carbs and should be avoided. Talk to your dietitian to identify your daily goals for nutrients listed on the label. Shopping Avoid buying canned, pre-made, or processed foods. These foods tend to be high in fat, sodium, and added sugar. Shop around the outside edge of the grocery store. This is where you will most often find fresh fruits and  vegetables, bulk grains, fresh meats, and fresh dairy products. Cooking Use low-heat cooking methods, such as baking, instead of high-heat cooking methods, such as deep frying. Cook using healthy oils, such as olive, canola, or sunflower oil. Avoid cooking with butter, cream, or high-fat meats. Meal planning Eat meals and snacks regularly, preferably at the same times every day. Avoid going long periods of time without eating. Eat foods that are high in fiber, such as fresh fruits, vegetables, beans, and whole grains. Eat 4-6 oz (112-168 g) of lean protein each day, such as lean meat, chicken, fish, eggs, or tofu. One ounce (oz) (28 g) of lean protein is equal to: 1 oz (28 g) of meat, chicken, or fish. 1 egg.  cup (62 g) of tofu. Eat some foods each day that contain healthy fats, such as avocado, nuts, seeds, and fish. What foods should I eat? Fruits Berries. Apples. Oranges. Peaches. Apricots. Plums. Grapes. Mangoes. Papayas. Pomegranates. Kiwi. Cherries. Vegetables Leafy greens, including lettuce, spinach, kale, chard, collard greens, mustard greens, and cabbage. Beets. Cauliflower. Broccoli. Carrots. Green beans. Tomatoes. Peppers. Onions. Cucumbers. Brussels sprouts. Grains Whole grains, such as whole-wheat or whole-grain bread, crackers, tortillas, cereal, and pasta. Unsweetened oatmeal. Quinoa. Brown or wild rice. Meats and other proteins Seafood. Poultry without skin. Lean cuts of poultry and beef. Tofu. Nuts. Seeds. Dairy Low-fat or fat-free dairy products such as milk, yogurt, and cheese. The items listed above may not be a complete list of foods and beverages you can eat and drink. Contact a dietitian for more information. What foods should I avoid? Fruits Fruits canned with syrup. Vegetables Canned vegetables. Frozen vegetables with butter or cream sauce. Grains Refined white flour and flour products such as bread, pasta, snack foods, and cereals. Avoid all processed  foods. Meats and other proteins Fatty cuts of meat. Poultry with skin. Breaded or fried meats. Processed meat. Avoid saturated fats. Dairy Full-fat yogurt, cheese, or milk. Beverages Sweetened drinks, such as soda or iced tea. The items listed above may not be a complete list of foods and beverages you should avoid. Contact a dietitian for more information. Questions to ask a health care provider Do I need to meet with a certified diabetes care and education specialist? Do I need to meet with a dietitian? What number can I call if I have questions? When are the best times to check my blood glucose? Where to find more information: American Diabetes Association: diabetes.org Academy of Nutrition and Dietetics: eatright.[employer] of Diabetes and Digestive and Kidney Diseases: [employer] Association of Diabetes Care & Education Specialists: diabeteseducator.org Summary It is important to have healthy eating habits because your blood sugar (glucose) levels are greatly affected by what you eat and drink. It is important to use alcohol carefully. A healthy meal plan will help you manage your blood glucose and lower your risk of heart disease. Your health care provider may recommend that you work with a dietitian to make a meal plan that is  best for you. This information is not intended to replace advice given to you by your health care provider. Make sure you discuss any questions you have with your health care provider. Document Revised: 10/21/2019 Document Reviewed: 10/21/2019 Elsevier Patient Education  [employer].

## 2021-08-17 NOTE — Progress Notes (Signed)
Patient Care Team    Relationship Specialty Notifications Start End  [name], DO PCP - General Family Medicine  11/02/15      SUBJECTIVE Chief Complaint  Patient presents with   Diabetes    [place]; pt is not fasting    HPI: Kristy Cooper is a  56 y.o. female.  Adjustment disorder with mixed anxiety and depressed mood Patient presents for follow-up on anxiety and depression today. She reports she is feeling well underwent 100 mg daily.  She would like to stay on the medication.   diabetes/obesity Patient has tried to watch her diet and exercise.    patient admits she has not been exercising or watching her diet as much as she should.  Weight 199 lbs. > 196> 195 Body mass index is 30.54 kg/m. Pt reports compliance with metformin 500 BID.  She was tolerating the Ozempic starter dose of 0.25 mg weekly injection.  However her insurance is now declining.  Patient denies dizziness, hyperglycemic or hypoglycemic events. Patient denies numbness, tingling in the extremities or nonhealing wounds of feet.   Hyperlipidemia, unspecified hyperlipidemia type/ Hypertriglyceridemia Patient has a history of hyperlipidemia and hypertriglyceridemia.fhx HD.   She is compliant with lipitor 80 mg qd.  No negative side effects reported.  Low back pain/ DDD (degenerative disc disease),/ Lumbar radiculopathy: Patient reports gabapentin works great for her lumbar pain.  Compliant gabapentin 400 mg 3 times a day.   Prior note This is a new problem for patient. She states she has had right-sided low back pain for about one month. There was no increase in activity, lifting or known injury acutely. She states she did have an injury when she was a young child, which did not need any medical intervention, but nothing recent. However over the last month she started with right sided lower back pain, that radiated into her buttock and now is radiating down to her right foot. She reports tingling sensation  intermittently in her right foot. She denies any weakness. She denies any bladder or bowel changes.      08/17/2021   10:52 AM 04/19/2021    1:09 PM 02/01/2020    3:44 PM 08/10/2019    1:00 PM 02/04/2019    3:27 PM  Depression screen PHQ 2/9  Decreased Interest 0 0 0 0 0  Down, Depressed, Hopeless 1 0 0 0 0  PHQ - 2 Score 1 0 0 0 0  Altered sleeping '2  1 3 '$ 0  Tired, decreased energy 1  0 0 0  Change in appetite 2  0 0 0  Feeling bad or failure about yourself  0  0 0 0  Trouble concentrating 0  0 0 0  Moving slowly or fidgety/restless 0  0 0 0  Suicidal thoughts 0  0 0 0  PHQ-9 Score '6  1 3 '$ 0  Difficult doing work/chores    Not difficult at all Not difficult at all      08/17/2021   10:52 AM 02/01/2020    3:44 PM 08/10/2019    1:00 PM 02/04/2019    3:27 PM  GAD 7 : Generalized Anxiety Score  Nervous, Anxious, on Edge 0 0 0 0  Control/stop worrying '2 1 3 '$ 0  Worry too much - different things '2 1 3 '$ 0  Trouble relaxing 1 0 0 0  Restless 1 0 0 0  Easily annoyed or irritable 0 0 0 0  Afraid - awful might happen 0 0 0  0  Total GAD 7 Score '6 2 6 '$ 0  Anxiety Difficulty   Not difficult at all Not difficult at all    ROS: See pertinent positives and negatives per HPI.  Patient Active Problem List   Diagnosis Date Noted   Type 2 diabetes mellitus with hyperlipidemia (HCC)-LDL goal <100 11/23/2020   BMI 30.0-30.9,adult 11/23/2020   On statin therapy 11/23/2020   DDD (degenerative disc disease), lumbar 09/03/2016   Lumbar radiculopathy 09/03/2016   Adjustment disorder with mixed anxiety and depressed mood 07/30/2016   History of colon polyps 11/02/2015   Morbid obesity ([place]) 11/02/2015    Social History   Tobacco Use   Smoking status: Former   Smokeless tobacco: Never  Substance Use Topics   Alcohol use: Yes    Comment: special occasions    Current Outpatient Medications:    Aspirin-Acetaminophen-Caffeine (EXCEDRIN MIGRAINE PO), Take 3 tablets by mouth daily as needed  (migraines.). , Disp: , Rfl:    atorvastatin (LIPITOR) 80 MG tablet, Take 1 tablet (80 mg total) by mouth daily., Disp: 90 tablet, Rfl: 3   beta carotene w/minerals (OCUVITE) tablet, Take 1 tablet by mouth daily., Disp: , Rfl:    Continuous Blood Gluc Receiver (DEXCOM G6 RECEIVER) DEVI, USE TO MONITOR BLOOD       GLUCOSE 3 TIMES A DAY, Disp: 1 each, Rfl: 0   Continuous Blood Gluc Sensor (DEXCOM G6 SENSOR) MISC, Monitor blood glucose TID, Disp: 3 each, Rfl: 5   Continuous Blood Gluc Transmit (DEXCOM G6 TRANSMITTER) MISC, Monitor blood glucose TID, Disp: 1 each, Rfl: 3   Insulin Pen Needle 32G X 6 MM MISC, Daily use with Victoza pen, Disp: 90 each, Rfl: 3   liraglutide (VICTOZA) 18 MG/3ML SOPN, Inject 1.2 mg into the skin daily., Disp: 3 mL, Rfl: 5   gabapentin (NEURONTIN) 400 MG capsule, Take 1 capsule (400 mg total) by mouth 3 (three) times daily., Disp: 270 capsule, Rfl: 1   metFORMIN (GLUCOPHAGE) 500 MG tablet, Take 1 tablet (500 mg total) by mouth 2 (two) times daily with a meal., Disp: 180 tablet, Rfl: 1   sertraline (ZOLOFT) 100 MG tablet, Take 1 tablet (100 mg total) by mouth daily., Disp: 90 tablet, Rfl: 1  Allergies  Allergen Reactions   Ibuprofen Swelling    Swelling of eyes and mouth   Meloxicam Swelling   Voltaren [Diclofenac] Swelling    OBJECTIVE: BP 114/72   Pulse 87   Temp 98 F (36.7 C) (Oral)   Ht '5\' 7"'$  (1.702 m)   Wt 195 lb (88.5 kg)   LMP 10/01/2018 (Approximate)   SpO2 97%   BMI 30.54 kg/m  Physical Exam Vitals and nursing note reviewed.  Constitutional:      General: She is not in acute distress.    Appearance: Normal appearance. She is obese. She is not ill-appearing, toxic-appearing or diaphoretic.  HENT:     Head: Normocephalic and atraumatic.  Eyes:     General: No scleral icterus.       Right eye: No discharge.        Left eye: No discharge.     Extraocular Movements: Extraocular movements intact.     Conjunctiva/sclera: Conjunctivae normal.      Pupils: Pupils are equal, round, and reactive to light.  Cardiovascular:     Rate and Rhythm: Normal rate and regular rhythm.     Heart sounds: No murmur heard. Pulmonary:     Effort: Pulmonary effort is normal. No respiratory distress.  Breath sounds: Normal breath sounds. No wheezing, rhonchi or rales.  Musculoskeletal:     Right lower leg: No edema.     Left lower leg: No edema.  Skin:    Findings: No rash.  Neurological:     Mental Status: She is alert and oriented to person, place, and time. Mental status is at baseline.  Psychiatric:        Mood and Affect: Mood normal.        Behavior: Behavior normal.        Thought Content: Thought content normal.        Judgment: Judgment normal.   Diabetic Foot Exam - Simple   Simple Foot Form Diabetic Foot exam was performed with the following findings: Yes 08/17/2021  2:37 PM  Visual Inspection No deformities, no ulcerations, no other skin breakdown bilaterally: Yes Sensation Testing Pulse Check Posterior Tibialis and Dorsalis pulse intact bilaterally: Yes Comments       ASSESSMENT AND PLAN: Kristy Cooper is a 56 y.o. female present for  Adjustment disorder with mixed anxiety and depressed mood Stable Continue Zoloft 100 mg daily.  We discussed maybe trying a different medication in the future with less weight gain profile. - Follow-up in 4 months, sooner if needed.   Lumbar radiculopathy Stable Continue   gabapentin 400 mg 3 times daily.  - f/u 67m unless needed sooner.   diabetes with hyperlipidemia. Had improved with the use of Ozempic.  Unfortunately insurance is no longer covering.  We discussed different options today since she is not able to tolerate higher doses of metformin secondary to GI side effects.  Continue metformin 500 mg twice daily> she is already having GI SE and will not tolerate a higher dose.  DC Ozempic.  Start Victoza 1.2 daily. Discussed diabetic diet. Increase exercise. Ophthalmology  up-to-date 05/2021 Foot exam completed  08/17/2021 Microalbumin UTD 08/17/2021 Influenza vaccine UTD 2022 Prevnar 20 UTD 2022 Last a1c 6.3> 6.6 > 6.6> 7.6> 7.0>6.5 today  Hyperlipidemia, unspecified hyperlipidemia type/ Hypertriglyceridemia/ on statin therapy.  - continue statin Fhx HD present in mother.  - diet and exercise.  - continue atorvastatin 80 mg daily.  Follow-up in 4 months    Orders Placed This Encounter  Procedures   Microalbumin / creatinine urine ratio   POCT HgB A1C   Meds ordered this encounter  Medications   gabapentin (NEURONTIN) 400 MG capsule    Sig: Take 1 capsule (400 mg total) by mouth 3 (three) times daily.    Dispense:  270 capsule    Refill:  1   metFORMIN (GLUCOPHAGE) 500 MG tablet    Sig: Take 1 tablet (500 mg total) by mouth 2 (two) times daily with a meal.    Dispense:  180 tablet    Refill:  1   sertraline (ZOLOFT) 100 MG tablet    Sig: Take 1 tablet (100 mg total) by mouth daily.    Dispense:  90 tablet    Refill:  1   liraglutide (VICTOZA) 18 MG/3ML SOPN    Sig: Inject 1.2 mg into the skin daily.    Dispense:  3 mL    Refill:  5   Insulin Pen Needle 32G X 6 MM MISC    Sig: Daily use with Victoza pen    Dispense:  90 each    Refill:  3    Referral Orders  No referral(s) requested today       RHoward Pouch DO 08/17/2021

## 2021-08-21 ENCOUNTER — Encounter: Payer: Self-pay | Admitting: Family Medicine

## 2021-08-21 ENCOUNTER — Telehealth: Payer: Self-pay | Admitting: Family Medicine

## 2021-08-21 NOTE — Telephone Encounter (Signed)
Spoke with pt regarding labs and instructions.   

## 2021-08-21 NOTE — Telephone Encounter (Signed)
Please inform patient she has mild elevation in protein in her urine however creatinine ratio is still normal.  Maintaining adequate control of blood pressure and diabetes will maintain her kidney health.

## 2021-10-08 ENCOUNTER — Ambulatory Visit
Admission: EM | Admit: 2021-10-08 | Discharge: 2021-10-08 | Disposition: A | Discharge status: Home / Self Care | Payer: BC Managed Care – PPO | Attending: Urgent Care | Admitting: Urgent Care

## 2021-10-08 ENCOUNTER — Other Ambulatory Visit: Payer: Self-pay | Admitting: Family Medicine

## 2021-10-08 ENCOUNTER — Encounter: Payer: Self-pay | Admitting: Emergency Medicine

## 2021-10-08 DIAGNOSIS — E119 Type 2 diabetes mellitus without complications: Secondary | ICD-10-CM

## 2021-10-08 DIAGNOSIS — W57XXXA Bitten or stung by nonvenomous insect and other nonvenomous arthropods, initial encounter: Secondary | ICD-10-CM

## 2021-10-08 DIAGNOSIS — S00462A Insect bite (nonvenomous) of left ear, initial encounter: Secondary | ICD-10-CM | POA: Diagnosis not present

## 2021-10-08 DIAGNOSIS — E1169 Type 2 diabetes mellitus with other specified complication: Secondary | ICD-10-CM | POA: Diagnosis not present

## 2021-10-08 MED ORDER — HYDROCORTISONE 1 % EX CREA: TOPICAL_CREAM | CUTANEOUS | 0 refills | Status: DC

## 2021-10-08 MED ORDER — DOXYCYCLINE HYCLATE 100 MG PO CAPS
200.0000 mg | ORAL_CAPSULE | Freq: Once | ORAL | 0 refills | Status: AC
Start: 2021-10-08 — End: 2021-10-08

## 2021-10-08 NOTE — ED Triage Notes (Signed)
Pulled a small tick of the back of her left ear one hour prior

## 2021-10-08 NOTE — ED Provider Notes (Signed)
[place]   MRN: [id number] DOB: 1966/03/26  Subjective:   Kristy Cooper is a 56 y.o. female presenting for concerns for a tick bite to the left ear.  They removed it at home and came straight here.  This happened about an hour ago.  Patient is a type II diabetic treated without insulin.  No current facility-administered medications for this encounter.  Current Outpatient Medications:    Aspirin-Acetaminophen-Caffeine (EXCEDRIN MIGRAINE PO), Take 3 tablets by mouth daily as needed (migraines.). , Disp: , Rfl:    atorvastatin (LIPITOR) 80 MG tablet, Take 1 tablet (80 mg total) by mouth daily., Disp: 90 tablet, Rfl: 3   beta carotene w/minerals (OCUVITE) tablet, Take 1 tablet by mouth daily., Disp: , Rfl:    Continuous Blood Gluc Receiver (DEXCOM G6 RECEIVER) DEVI, USE TO MONITOR BLOOD       GLUCOSE 3 TIMES A DAY, Disp: 1 each, Rfl: 0   Continuous Blood Gluc Sensor (DEXCOM G6 SENSOR) MISC, Monitor blood glucose TID, Disp: 3 each, Rfl: 5   Continuous Blood Gluc Transmit (DEXCOM G6 TRANSMITTER) MISC, Monitor blood glucose TID, Disp: 1 each, Rfl: 3   gabapentin (NEURONTIN) 400 MG capsule, Take 1 capsule (400 mg total) by mouth 3 (three) times daily., Disp: 270 capsule, Rfl: 1   Insulin Pen Needle 32G X 6 MM MISC, Daily use with Victoza pen, Disp: 90 each, Rfl: 3   liraglutide (VICTOZA) 18 MG/3ML SOPN, Inject 1.2 mg into the skin daily., Disp: 3 mL, Rfl: 5   metFORMIN (GLUCOPHAGE) 500 MG tablet, Take 1 tablet (500 mg total) by mouth 2 (two) times daily with a meal., Disp: 180 tablet, Rfl: 1   sertraline (ZOLOFT) 100 MG tablet, Take 1 tablet (100 mg total) by mouth daily., Disp: 90 tablet, Rfl: 1   Allergies  Allergen Reactions   Ibuprofen Swelling    Swelling of eyes and mouth   Meloxicam Swelling   Voltaren [Diclofenac] Swelling    Past Medical History:  Diagnosis Date   Arthralgia of left temporomandibular joint 12/24/2018   Bilateral foot pain     chronic- told arthritis and was prescribed tramadol.    Colon polyp 2014   tubular adenoma   Depression    Depression with anxiety    on meds   Diabetes mellitus without complication ([place])    on meds   GERD (gastroesophageal reflux disease)    hx of-diet controlled   Hyperlipidemia LDL goal <130    on meds   Left breast mass    Obesity    Prediabetes    Right rotator cuff tendinitis 2013   Sciatic leg pain 2019   right leg     Past Surgical History:  Procedure Laterality Date   BREAST LUMPECTOMY WITH RADIOACTIVE SEED LOCALIZATION Left 11/25/2019   Procedure: RADIOACTIVE SEED GUIDED LEFT BREAST LUMPECTOMY;  Surgeon: Coralie Keens, MD;  Location: [facility];  Service: General;  Laterality: Left;  LMA   CESAREAN SECTION     COLONOSCOPY  2016   [facility]-prep exc-TA   POLYPECTOMY  2016   TA    Family History  Problem Relation Age of Onset   Colon polyps Mother 42   Breast cancer Mother    Diabetes Mother    Mental illness Mother    Heart disease Mother    Arthritis Mother    Arthritis Father    Mental illness Father    Colon cancer Maternal Aunt 51   Colon  polyps Maternal Aunt 53   Breast cancer Maternal Aunt    Diabetes Maternal Aunt    Arthritis Maternal Aunt    Colon polyps Maternal Uncle    Colon cancer Maternal Uncle    Diabetes Maternal Uncle    Colon polyps Paternal Aunt    Colon cancer Paternal Aunt    Diabetes Paternal Aunt    Heart disease Paternal Aunt    Esophageal cancer Neg Hx    Stomach cancer Neg Hx    Rectal cancer Neg Hx    Thyroid cancer Neg Hx     Social History   Tobacco Use   Smoking status: Former   Smokeless tobacco: Never  Scientific laboratory technician Use: Never used  Substance Use Topics   Alcohol use: Yes    Comment: special occasions   Drug use: No    ROS   Objective:   Vitals: BP 124/75 (BP Location: Right Arm)   Pulse 91   Temp 98.8 F (37.1 C) (Oral)   Resp 20   LMP 10/01/2018 (Approximate)    SpO2 96%   Physical Exam Constitutional:      General: She is not in acute distress.    Appearance: Normal appearance. She is well-developed. She is not ill-appearing, toxic-appearing or diaphoretic.  HENT:     Head: Normocephalic and atraumatic.     Ears:     Comments: Resolving tick bite wound over the posterior superior mid ear.    Nose: Nose normal.     Mouth/Throat:     Mouth: Mucous membranes are moist.  Eyes:     General: No scleral icterus.       Right eye: No discharge.        Left eye: No discharge.     Extraocular Movements: Extraocular movements intact.  Cardiovascular:     Rate and Rhythm: Normal rate.  Pulmonary:     Effort: Pulmonary effort is normal.  Skin:    General: Skin is warm and dry.  Neurological:     General: No focal deficit present.     Mental Status: She is alert and oriented to person, place, and time.  Psychiatric:        Mood and Affect: Mood normal.        Behavior: Behavior normal.     Assessment and Plan :   PDMP not reviewed this encounter.  1. Tick bite of left ear, initial encounter   2. Type 2 diabetes mellitus treated without insulin (HCC)    We will use 200 mg doxycycline for tick bite prophylaxis.  Recommended hydrocortisone cream for contact dermatitis should it develop.  Discussed signs and symptoms of tickborne illness, patient is to do a recheck if these develop.  Counseled patient on potential for adverse effects with medications prescribed/recommended today, ER and return-to-clinic precautions discussed, patient verbalized understanding.    Jaynee Eagles, PA-C 10/08/21 1513

## 2021-10-09 ENCOUNTER — Encounter: Payer: Self-pay | Admitting: Family Medicine

## 2021-10-09 NOTE — Telephone Encounter (Signed)
Unfortunately could not advise her without further details which would require an appointment to discuss. Basically routine recommendations are if tick is found, removed in completion within first 48 hours then the dose she was provided of doxycycline could be sufficient.  However if she has any other symptoms including rash, headache, fever, joint pain that is related to the tick exposure that occurs within 3-4 weeks of the exposure, then follow-up would be required with lab work and possible further intervention.

## 2021-10-09 NOTE — Telephone Encounter (Signed)
Please advise if the pt need to be seen with no sx.

## 2021-10-25 DIAGNOSIS — Z1231 Encounter for screening mammogram for malignant neoplasm of breast: Secondary | ICD-10-CM | POA: Diagnosis not present

## 2021-10-25 DIAGNOSIS — Z6831 Body mass index (BMI) 31.0-31.9, adult: Secondary | ICD-10-CM | POA: Diagnosis not present

## 2021-10-25 DIAGNOSIS — Z01419 Encounter for gynecological examination (general) (routine) without abnormal findings: Secondary | ICD-10-CM | POA: Diagnosis not present

## 2021-10-26 LAB — HM MAMMOGRAPHY

## 2021-11-26 ENCOUNTER — Encounter: Payer: Self-pay | Admitting: Family Medicine

## 2021-11-27 NOTE — Telephone Encounter (Signed)
It appears patient is due for her diabetes follow-up.  Last seen in May and she does not have another scheduled.  Please advise her to make her appointment and we did will discuss alternative options for treatment at that time.

## 2021-11-27 NOTE — Telephone Encounter (Signed)
Please advise 

## 2021-11-29 ENCOUNTER — Encounter: Payer: Self-pay | Admitting: Family Medicine

## 2021-11-29 ENCOUNTER — Ambulatory Visit: Payer: BC Managed Care – PPO | Admitting: Family Medicine

## 2021-11-29 VITALS — BP 110/66 | HR 76 | Temp 98.0°F | Ht 67.0 in | Wt 194.0 lb

## 2021-11-29 DIAGNOSIS — F4323 Adjustment disorder with mixed anxiety and depressed mood: Secondary | ICD-10-CM

## 2021-11-29 DIAGNOSIS — E1169 Type 2 diabetes mellitus with other specified complication: Secondary | ICD-10-CM | POA: Diagnosis not present

## 2021-11-29 DIAGNOSIS — E785 Hyperlipidemia, unspecified: Secondary | ICD-10-CM

## 2021-11-29 DIAGNOSIS — Z79899 Other long term (current) drug therapy: Secondary | ICD-10-CM | POA: Diagnosis not present

## 2021-11-29 DIAGNOSIS — Z6830 Body mass index (BMI) 30.0-30.9, adult: Secondary | ICD-10-CM | POA: Diagnosis not present

## 2021-11-29 DIAGNOSIS — M5136 Other intervertebral disc degeneration, lumbar region: Secondary | ICD-10-CM

## 2021-11-29 DIAGNOSIS — M5416 Radiculopathy, lumbar region: Secondary | ICD-10-CM

## 2021-11-29 LAB — POCT GLYCOSYLATED HEMOGLOBIN (HGB A1C)
HbA1c POC (<> result, manual entry): 6.6 % (ref 4.0–5.6)
HbA1c, POC (controlled diabetic range): 6.6 % (ref 0.0–7.0)
HbA1c, POC (prediabetic range): 6.6 % — AB (ref 5.7–6.4)
Hemoglobin A1C: 6.6 % — AB (ref 4.0–5.6)

## 2021-11-29 MED ORDER — ATORVASTATIN CALCIUM 80 MG PO TABS
80.0000 mg | ORAL_TABLET | Freq: Every day | ORAL | 3 refills | Status: DC
Start: 2021-11-29 — End: 2022-06-04

## 2021-11-29 MED ORDER — GABAPENTIN 400 MG PO CAPS: 400.0000 mg | ORAL_CAPSULE | Freq: Three times a day (TID) | ORAL | 1 refills | Status: DC

## 2021-11-29 MED ORDER — SERTRALINE HCL 100 MG PO TABS: 100.0000 mg | ORAL_TABLET | Freq: Every day | ORAL | 1 refills | Status: DC

## 2021-11-29 MED ORDER — DAPAGLIFLOZIN PROPANEDIOL 10 MG PO TABS: 10.0000 mg | ORAL_TABLET | Freq: Every day | ORAL | 1 refills | Status: DC

## 2021-11-29 MED ORDER — METFORMIN HCL 500 MG PO TABS: 500.0000 mg | ORAL_TABLET | Freq: Two times a day (BID) | ORAL | 1 refills | Status: DC

## 2021-11-29 NOTE — Patient Instructions (Signed)
No follow-ups on file.        Great to see you today.  I have refilled the medication(s) we provide.   If labs were collected, we will inform you of lab results once received either by echart message or telephone call.   - echart message- for normal results that have been seen by the patient already.   - telephone call: abnormal results or if patient has not viewed results in their echart.  

## 2021-11-29 NOTE — Progress Notes (Signed)
Patient Care Team    Relationship Specialty Notifications Start End  [name], DO PCP - General Family Medicine  11/02/15      SUBJECTIVE Chief Complaint  Patient presents with   Diabetes    [facility]; pt is not fasting     HPI: Kristy Cooper is a  56 y.o. female.  Adjustment disorder with mixed anxiety and depressed mood Patient presents for follow-up on anxiety and depression today. She reports she is feeling well with Zoloft 100 mg daily.  She would like to stay on the medication.   diabetes/obesity Pt reports compliance with metformin 500 BID.  She was tolerating the Ozempic starter dose of 0.25 mg weekly injection.  However her insurance is now declining.  She also was tried on Victoza and was doing well, but now too expensive.  She is concerned her blood sugar will go up high again if she does not get a second a diabetic agent.  She has not been using her Saxenda for approximately 1-2 weeks.  Patient denies dizziness, hyperglycemic or hypoglycemic events. Patient denies numbness, tingling in the extremities or nonhealing wounds of feet.     Hyperlipidemia, unspecified hyperlipidemia type/ Hypertriglyceridemia Patient has a history of hyperlipidemia and hypertriglyceridemia.fhx HD.   She reports compliance with lipitor 80 mg qd.  No negative side effects reported.  Low back pain/ DDD (degenerative disc disease),/ Lumbar radiculopathy: Patient reports gabapentin works great for her lumbar pain.  Compliant with gabapentin 400 mg 3 times a day.   Prior note This is a new problem for patient. She states she has had right-sided low back pain for about one month. There was no increase in activity, lifting or known injury acutely. She states she did have an injury when she was a young child, which did not need any medical intervention, but nothing recent. However over the last month she started with right sided lower back pain, that radiated into her buttock and now is radiating down  to her right foot. She reports tingling sensation intermittently in her right foot. She denies any weakness. She denies any bladder or bowel changes.      08/17/2021   10:52 AM 04/19/2021    1:09 PM 02/01/2020    3:44 PM 08/10/2019    1:00 PM 02/04/2019    3:27 PM  Depression screen PHQ 2/9  Decreased Interest 0 0 0 0 0  Down, Depressed, Hopeless 1 0 0 0 0  PHQ - 2 Score 1 0 0 0 0  Altered sleeping '2  1 3 '$ 0  Tired, decreased energy 1  0 0 0  Change in appetite 2  0 0 0  Feeling bad or failure about yourself  0  0 0 0  Trouble concentrating 0  0 0 0  Moving slowly or fidgety/restless 0  0 0 0  Suicidal thoughts 0  0 0 0  PHQ-9 Score '6  1 3 '$ 0  Difficult doing work/chores    Not difficult at all Not difficult at all      08/17/2021   10:52 AM 02/01/2020    3:44 PM 08/10/2019    1:00 PM 02/04/2019    3:27 PM  GAD 7 : Generalized Anxiety Score  Nervous, Anxious, on Edge 0 0 0 0  Control/stop worrying '2 1 3 '$ 0  Worry too much - different things '2 1 3 '$ 0  Trouble relaxing 1 0 0 0  Restless 1 0 0 0  Easily annoyed or irritable  0 0 0 0  Afraid - awful might happen 0 0 0 0  Total GAD 7 Score '6 2 6 '$ 0  Anxiety Difficulty   Not difficult at all Not difficult at all    ROS: See pertinent positives and negatives per HPI.  Patient Active Problem List   Diagnosis Date Noted   Type 2 diabetes mellitus with hyperlipidemia (HCC)-LDL goal <100 11/23/2020   BMI 30.0-30.9,adult 11/23/2020   On statin therapy 11/23/2020   DDD (degenerative disc disease), lumbar 09/03/2016   Lumbar radiculopathy 09/03/2016   Adjustment disorder with mixed anxiety and depressed mood 07/30/2016   History of colon polyps 11/02/2015   Morbid obesity ([place]) 11/02/2015    Social History   Tobacco Use   Smoking status: Former   Smokeless tobacco: Never  Substance Use Topics   Alcohol use: Yes    Comment: special occasions    Current Outpatient Medications:    Aspirin-Acetaminophen-Caffeine (EXCEDRIN MIGRAINE  PO), Take 3 tablets by mouth daily as needed (migraines.). , Disp: , Rfl:    beta carotene w/minerals (OCUVITE) tablet, Take 1 tablet by mouth daily., Disp: , Rfl:    Continuous Blood Gluc Receiver (DEXCOM G6 RECEIVER) DEVI, USE TO MONITOR BLOOD       GLUCOSE 3 TIMES A DAY, Disp: 1 each, Rfl: 0   Continuous Blood Gluc Sensor (DEXCOM G6 SENSOR) MISC, USE TO MONITOR BLOOD       GLUCOSE 3 TIMES A DAY, Disp: 3 each, Rfl: 5   Continuous Blood Gluc Transmit (DEXCOM G6 TRANSMITTER) MISC, Monitor blood glucose TID, Disp: 1 each, Rfl: 3   dapagliflozin propanediol (FARXIGA) 10 MG TABS tablet, Take 1 tablet (10 mg total) by mouth daily before breakfast., Disp: 90 tablet, Rfl: 1   hydrocortisone cream 1 %, Apply to affected area 2 times daily, Disp: 15 g, Rfl: 0   Insulin Pen Needle 32G X 6 MM MISC, Daily use with Victoza pen, Disp: 90 each, Rfl: 3   liraglutide (VICTOZA) 18 MG/3ML SOPN, Inject 1.2 mg into the skin daily., Disp: 3 mL, Rfl: 5   atorvastatin (LIPITOR) 80 MG tablet, Take 1 tablet (80 mg total) by mouth daily., Disp: 90 tablet, Rfl: 3   gabapentin (NEURONTIN) 400 MG capsule, Take 1 capsule (400 mg total) by mouth 3 (three) times daily., Disp: 270 capsule, Rfl: 1   metFORMIN (GLUCOPHAGE) 500 MG tablet, Take 1 tablet (500 mg total) by mouth 2 (two) times daily with a meal., Disp: 180 tablet, Rfl: 1   sertraline (ZOLOFT) 100 MG tablet, Take 1 tablet (100 mg total) by mouth daily., Disp: 90 tablet, Rfl: 1  Allergies  Allergen Reactions   Ibuprofen Swelling    Swelling of eyes and mouth   Meloxicam Swelling   Voltaren [Diclofenac] Swelling    OBJECTIVE: BP 110/66   Pulse 76   Temp 98 F (36.7 C) (Oral)   Ht '5\' 7"'$  (1.702 m)   Wt 194 lb (88 kg)   LMP 10/01/2018 (Approximate)   SpO2 96%   BMI 30.38 kg/m  Physical Exam Vitals and nursing note reviewed.  Constitutional:      General: She is not in acute distress.    Appearance: Normal appearance. She is obese. She is not ill-appearing,  toxic-appearing or diaphoretic.  HENT:     Head: Normocephalic and atraumatic.  Eyes:     General: No scleral icterus.       Right eye: No discharge.        Left eye:  No discharge.     Extraocular Movements: Extraocular movements intact.     Conjunctiva/sclera: Conjunctivae normal.     Pupils: Pupils are equal, round, and reactive to light.  Cardiovascular:     Rate and Rhythm: Normal rate and regular rhythm.     Heart sounds: No murmur heard. Pulmonary:     Effort: Pulmonary effort is normal. No respiratory distress.     Breath sounds: Normal breath sounds. No wheezing, rhonchi or rales.  Musculoskeletal:     Right lower leg: No edema.     Left lower leg: No edema.  Skin:    Findings: No rash.  Neurological:     Mental Status: She is alert and oriented to person, place, and time. Mental status is at baseline.  Psychiatric:        Mood and Affect: Mood normal.        Behavior: Behavior normal.        Thought Content: Thought content normal.        Judgment: Judgment normal.      ASSESSMENT AND PLAN: Kristy Cooper is a 56 y.o. female present for  Adjustment disorder with mixed anxiety and depressed mood Stable Continue Zoloft 100 mg daily.  We discussed maybe trying a different medication in the future with less weight gain profile. - Follow-up in 4 months, sooner if needed.   Lumbar radiculopathy We will Continue gabapentin 400 mg 3 times daily.  - f/u 47m unless needed sooner.   diabetes with hyperlipidemia. Stable Continue metformin 500 mg twice daily> she is already having GI SE and will not tolerate a higher dose.  Start Farxiga 10 mg daily Tried: Ozempic (too expensive), Victoza (too expensive) Discussed diabetic diet. Increase exercise. Ophthalmology up-to-date 05/2021 Foot exam completed  08/17/2021 Microalbumin UTD 08/17/2021 Influenza vaccine UTD 2022> delayed today since she is getting over COVID Prevnar 20 UTD 2022 Last a1c 6.3> 6.6 > 6.6> 7.6>  7.0>6.5 > 6.6 today  Hyperlipidemia, unspecified hyperlipidemia type/ Hypertriglyceridemia/ on statin therapy.  -Fhx HD present in mother.  - diet and exercise.  -Continue atorvastatin 80 mg daily.  Follow-up in 4 months    Orders Placed This Encounter  Procedures   POCT HgB A1C   Meds ordered this encounter  Medications   atorvastatin (LIPITOR) 80 MG tablet    Sig: Take 1 tablet (80 mg total) by mouth daily.    Dispense:  90 tablet    Refill:  3   gabapentin (NEURONTIN) 400 MG capsule    Sig: Take 1 capsule (400 mg total) by mouth 3 (three) times daily.    Dispense:  270 capsule    Refill:  1   metFORMIN (GLUCOPHAGE) 500 MG tablet    Sig: Take 1 tablet (500 mg total) by mouth 2 (two) times daily with a meal.    Dispense:  180 tablet    Refill:  1   sertraline (ZOLOFT) 100 MG tablet    Sig: Take 1 tablet (100 mg total) by mouth daily.    Dispense:  90 tablet    Refill:  1   dapagliflozin propanediol (FARXIGA) 10 MG TABS tablet    Sig: Take 1 tablet (10 mg total) by mouth daily before breakfast.    Dispense:  90 tablet    Refill:  1    Referral Orders  No referral(s) requested today       RHoward Pouch DO 11/29/2021

## 2021-12-28 ENCOUNTER — Encounter: Payer: Self-pay | Admitting: Family Medicine

## 2022-01-03 ENCOUNTER — Telehealth: Payer: Self-pay

## 2022-01-03 NOTE — Telephone Encounter (Signed)
error 

## 2022-02-01 ENCOUNTER — Other Ambulatory Visit: Payer: Self-pay | Admitting: Family Medicine

## 2022-03-14 ENCOUNTER — Ambulatory Visit (INDEPENDENT_AMBULATORY_CARE_PROVIDER_SITE_OTHER): Payer: BC Managed Care – PPO | Admitting: Family Medicine

## 2022-03-14 ENCOUNTER — Encounter: Payer: Self-pay | Admitting: Family Medicine

## 2022-03-14 VITALS — BP 103/68 | HR 90 | Temp 98.0°F | Ht 67.0 in

## 2022-03-14 DIAGNOSIS — Z79899 Other long term (current) drug therapy: Secondary | ICD-10-CM | POA: Diagnosis not present

## 2022-03-14 DIAGNOSIS — Z6830 Body mass index (BMI) 30.0-30.9, adult: Secondary | ICD-10-CM

## 2022-03-14 DIAGNOSIS — M5136 Other intervertebral disc degeneration, lumbar region: Secondary | ICD-10-CM | POA: Diagnosis not present

## 2022-03-14 DIAGNOSIS — M5416 Radiculopathy, lumbar region: Secondary | ICD-10-CM | POA: Diagnosis not present

## 2022-03-14 DIAGNOSIS — Z23 Encounter for immunization: Secondary | ICD-10-CM

## 2022-03-14 DIAGNOSIS — E785 Hyperlipidemia, unspecified: Secondary | ICD-10-CM | POA: Diagnosis not present

## 2022-03-14 DIAGNOSIS — E1169 Type 2 diabetes mellitus with other specified complication: Secondary | ICD-10-CM

## 2022-03-14 DIAGNOSIS — F4323 Adjustment disorder with mixed anxiety and depressed mood: Secondary | ICD-10-CM

## 2022-03-14 LAB — POCT GLYCOSYLATED HEMOGLOBIN (HGB A1C)
HbA1c POC (<> result, manual entry): 6.9 % (ref 4.0–5.6)
HbA1c, POC (controlled diabetic range): 6.9 % (ref 0.0–7.0)
HbA1c, POC (prediabetic range): 6.9 % — AB (ref 5.7–6.4)
Hemoglobin A1C: 6.9 % — AB (ref 4.0–5.6)

## 2022-03-14 MED ORDER — GABAPENTIN 400 MG PO CAPS: 400.0000 mg | ORAL_CAPSULE | Freq: Three times a day (TID) | ORAL | 1 refills | Status: DC

## 2022-03-14 MED ORDER — SERTRALINE HCL 100 MG PO TABS: 100.0000 mg | ORAL_TABLET | Freq: Every day | ORAL | 1 refills | Status: DC

## 2022-03-14 MED ORDER — DAPAGLIFLOZIN PROPANEDIOL 10 MG PO TABS: 10.0000 mg | ORAL_TABLET | Freq: Every day | ORAL | 1 refills | Status: DC

## 2022-03-14 MED ORDER — METFORMIN HCL 500 MG PO TABS: 500.0000 mg | ORAL_TABLET | Freq: Two times a day (BID) | ORAL | 1 refills | Status: DC

## 2022-03-14 NOTE — Progress Notes (Signed)
Patient Care Team    Relationship Specialty Notifications Start End  [name], DO PCP - General Family Medicine  11/02/15   Tyson Dense, MD Consulting Physician Obstetrics and Gynecology  12/28/21      SUBJECTIVE Chief Complaint  Patient presents with   Diabetes    [facility]; pt is not fasting     HPI: Kristy Cooper is a  56 y.o. female presents today for chronic medical conditions management Adjustment disorder with mixed anxiety and depressed mood She reports compliance with Zoloft 100 mg daily and feels it is working well for her at this dose.   diabetes/obesity Pt reports compliance with metformin 500 BID.   Tried: Ozempic-patient tolerated, insurance declined.  Victoza-too expensive  Patient denies dizziness, hyperglycemic or hypoglycemic events. Patient denies numbness, tingling in the extremities or nonhealing wounds of feet.    Hyperlipidemia, unspecified hyperlipidemia type/ Hypertriglyceridemia Patient has a history of hyperlipidemia and hypertriglyceridemia.fhx HD.    She reports compliance with lipitor 80 mg qd.   works great for her lumbar pain. Low back pain/ DDD (degenerative disc disease),/ Lumbar radiculopathy: Patient reports compliance with gabapentin 400 mg 3 times a day.  She feels it is working very well for her lower back radiculopathy. Prior note This is a new problem for patient. She states she has had right-sided low back pain for about one month. There was no increase in activity, lifting or known injury acutely. She states she did have an injury when she was a young child, which did not need any medical intervention, but nothing recent. However over the last month she started with right sided lower back pain, that radiated into her buttock and now is radiating down to her right foot. She reports tingling sensation intermittently in her right foot. She denies any weakness. She denies any bladder or bowel changes.      03/14/2022    3:43 PM  08/17/2021   10:52 AM 04/19/2021    1:09 PM 02/01/2020    3:44 PM 08/10/2019    1:00 PM  Depression screen PHQ 2/9  Decreased Interest 0 0 0 0 0  Down, Depressed, Hopeless 0 1 0 0 0  PHQ - 2 Score 0 1 0 0 0  Altered sleeping _0 Tired, decreased energy 0 1  0 0  Change in appetite 0 2  0 0  Feeling bad or failure about yourself  0 0  0 0  Trouble concentrating 0 0  0 0  Moving slowly or fidgety/restless 0 0  0 0  Suicidal thoughts 0 0  0 0  PHQ-9 Score _1 Difficult doing work/chores     Not difficult at all      03/14/2022    3:43 PM 08/17/2021   10:52 AM 02/01/2020    3:44 PM 08/10/2019    1:00 PM  GAD 7 : Generalized Anxiety Score  Nervous, Anxious, on Edge 0 0 0 0  Control/stop worrying 0 _2 Worry too much - different things 0 _3 Trouble relaxing 0 1 0 0  Restless 0 1 0 0  Easily annoyed or irritable 0 0 0 0  Afraid - awful might happen 0 0 0 0  Total GAD 7 Score 0 _4 Anxiety Difficulty    Not difficult at all    ROS: See pertinent positives and negatives per HPI.  Patient Active Problem List  Diagnosis Date Noted   Type 2 diabetes mellitus with hyperlipidemia (HCC)-LDL goal <100 11/23/2020   BMI 30.0-30.9,adult 11/23/2020   On statin therapy 11/23/2020   DDD (degenerative disc disease), lumbar 09/03/2016   Lumbar radiculopathy 09/03/2016   Adjustment disorder with mixed anxiety and depressed mood 07/30/2016   History of colon polyps 11/02/2015   Morbid obesity ([place]) 11/02/2015    Social History   Tobacco Use   Smoking status: Former   Smokeless tobacco: Never  Substance Use Topics   Alcohol use: Yes    Comment: special occasions    Current Outpatient Medications:    Aspirin-Acetaminophen-Caffeine (EXCEDRIN MIGRAINE PO), Take 3 tablets by mouth daily as needed (migraines.). , Disp: , Rfl:    atorvastatin (LIPITOR) 80 MG tablet, Take 1 tablet (80 mg total) by mouth daily., Disp: 90 tablet, Rfl: 3   beta carotene w/minerals (OCUVITE)  tablet, Take 1 tablet by mouth daily., Disp: , Rfl:    Continuous Blood Gluc Receiver (DEXCOM G6 RECEIVER) DEVI, USE TO MONITOR BLOOD       GLUCOSE 3 TIMES A DAY, Disp: 1 each, Rfl: 0   Continuous Blood Gluc Sensor (DEXCOM G6 SENSOR) MISC, USE TO MONITOR BLOOD       GLUCOSE 3 TIMES A DAY, Disp: 3 each, Rfl: 5   Continuous Blood Gluc Transmit (DEXCOM G6 TRANSMITTER) MISC, USE TO MONITOR BLOOD       GLUCOSE 3 TIMES A DAY, Disp: 1 each, Rfl: 3   dapagliflozin propanediol (FARXIGA) 10 MG TABS tablet, Take 1 tablet (10 mg total) by mouth daily before breakfast., Disp: 90 tablet, Rfl: 1   gabapentin (NEURONTIN) 400 MG capsule, Take 1 capsule (400 mg total) by mouth 3 (three) times daily., Disp: 270 capsule, Rfl: 1   metFORMIN (GLUCOPHAGE) 500 MG tablet, Take 1 tablet (500 mg total) by mouth 2 (two) times daily with a meal., Disp: 180 tablet, Rfl: 1   sertraline (ZOLOFT) 100 MG tablet, Take 1 tablet (100 mg total) by mouth daily., Disp: 90 tablet, Rfl: 1  Allergies  Allergen Reactions   Ibuprofen Swelling    Swelling of eyes and mouth   Meloxicam Swelling   Voltaren [Diclofenac] Swelling    OBJECTIVE: BP 103/68   Pulse 90   Temp 98 F (36.7 C) (Oral)   Ht _0  (1.702 m)   LMP 10/01/2018 (Approximate)   SpO2 98%   BMI 30.38 kg/m  Physical Exam Vitals and nursing note reviewed.  Constitutional:      General: She is not in acute distress.    Appearance: Normal appearance. She is obese. She is not ill-appearing, toxic-appearing or diaphoretic.  HENT:     Head: Normocephalic and atraumatic.  Eyes:     General: No scleral icterus.       Right eye: No discharge.        Left eye: No discharge.     Extraocular Movements: Extraocular movements intact.     Conjunctiva/sclera: Conjunctivae normal.     Pupils: Pupils are equal, round, and reactive to light.  Cardiovascular:     Rate and Rhythm: Normal rate and regular rhythm.     Heart sounds: No murmur heard. Pulmonary:     Effort:  Pulmonary effort is normal. No respiratory distress.     Breath sounds: Normal breath sounds. No wheezing, rhonchi or rales.  Musculoskeletal:     Right lower leg: No edema.     Left lower leg: No edema.  Skin:    Findings:  No rash.  Neurological:     Mental Status: She is alert and oriented to person, place, and time. Mental status is at baseline.  Psychiatric:        Mood and Affect: Mood normal.        Behavior: Behavior normal.        Thought Content: Thought content normal.        Judgment: Judgment normal.     ASSESSMENT AND PLAN: Kristy Cooper is a 56 y.o. female present for  Adjustment disorder with mixed anxiety and depressed mood Stable Continue Zoloft 100 mg daily.  We discussed maybe trying a different medication in the future with less weight gain profile. - Follow-up in 4 months, sooner if needed.   Lumbar radiculopathy Stable Continue gabapentin 400 mg 3 times daily.  - f/u 48m unless needed sooner.   diabetes with hyperlipidemia. Stable Continue metformin 500 mg twice daily> she is already having GI SE and will not tolerate a higher dose.  Continue Farxiga 10 mg daily Tried: Ozempic (too expensive), Victoza (too expensive) Discussed diabetic diet. Increase exercise. Ophthalmology up-to-date 05/2021 Foot exam completed  08/17/2021 Microalbumin UTD 08/17/2021 Influenza vaccine UTD 01/2022 Prevnar 20 UTD 2022 Last a1c 6.3> 6.6 > 6.6> 7.6> 7.0>6.5 > 6.6 > 6.9 today  Hyperlipidemia, unspecified hyperlipidemia type/ Hypertriglyceridemia/ on statin therapy.  -Fhx HD present in mother.  - diet and exercise.  Continue atorvastatin 80 mg daily.  Follow-up in 4 months   Orders Placed This Encounter  Procedures   CBC w/Diff   Comp Met (CMET)   TSH   Lipid panel   POCT HgB A1C   Meds ordered this encounter  Medications   dapagliflozin propanediol (FARXIGA) 10 MG TABS tablet    Sig: Take 1 tablet (10 mg total) by mouth daily before breakfast.     Dispense:  90 tablet    Refill:  1   gabapentin (NEURONTIN) 400 MG capsule    Sig: Take 1 capsule (400 mg total) by mouth 3 (three) times daily.    Dispense:  270 capsule    Refill:  1   metFORMIN (GLUCOPHAGE) 500 MG tablet    Sig: Take 1 tablet (500 mg total) by mouth 2 (two) times daily with a meal.    Dispense:  180 tablet    Refill:  1   sertraline (ZOLOFT) 100 MG tablet    Sig: Take 1 tablet (100 mg total) by mouth daily.    Dispense:  90 tablet    Refill:  1    Referral Orders  No referral(s) requested today    RHoward Pouch DO 03/14/2022

## 2022-03-14 NOTE — Patient Instructions (Addendum)
Return in about 15 weeks (around 06/27/2022).        Great to see you today.  I have refilled the medication(s) we provide.   If labs were collected, we will inform you of lab results once received either by echart message or telephone call.   - echart message- for normal results that have been seen by the patient already.   - telephone call: abnormal results or if patient has not viewed results in their echart.

## 2022-03-15 ENCOUNTER — Telehealth: Payer: Self-pay | Admitting: Family Medicine

## 2022-03-15 LAB — COMPREHENSIVE METABOLIC PANEL
AG Ratio: 1.7 (calc) (ref 1.0–2.5)
ALT: 17 U/L (ref 6–29)
AST: 21 U/L (ref 10–35)
Albumin: 4.9 g/dL (ref 3.6–5.1)
Alkaline phosphatase (APISO): 113 U/L (ref 37–153)
BUN: 21 mg/dL (ref 7–25)
CO2: 21 mmol/L (ref 20–32)
Calcium: 10 mg/dL (ref 8.6–10.4)
Chloride: 104 mmol/L (ref 98–110)
Creat: 0.74 mg/dL (ref 0.50–1.03)
Globulin: 2.9 g/dL (calc) (ref 1.9–3.7)
Glucose, Bld: 142 mg/dL — ABNORMAL HIGH (ref 65–99)
Potassium: 4.3 mmol/L (ref 3.5–5.3)
Sodium: 139 mmol/L (ref 135–146)
Total Bilirubin: 0.4 mg/dL (ref 0.2–1.2)
Total Protein: 7.8 g/dL (ref 6.1–8.1)

## 2022-03-15 LAB — CBC WITH DIFFERENTIAL/PLATELET
Absolute Monocytes: 563 cells/uL (ref 200–950)
Basophils Absolute: 39 cells/uL (ref 0–200)
Basophils Relative: 0.4 %
Eosinophils Absolute: 349 cells/uL (ref 15–500)
Eosinophils Relative: 3.6 %
HCT: 42 % (ref 35.0–45.0)
Hemoglobin: 14.5 g/dL (ref 11.7–15.5)
Lymphs Abs: 1523 cells/uL (ref 850–3900)
MCH: 27.3 pg (ref 27.0–33.0)
MCHC: 34.5 g/dL (ref 32.0–36.0)
MCV: 78.9 fL — ABNORMAL LOW (ref 80.0–100.0)
MPV: 9.6 fL (ref 7.5–12.5)
Monocytes Relative: 5.8 %
Neutro Abs: 7227 cells/uL (ref 1500–7800)
Neutrophils Relative %: 74.5 %
Platelets: 385 10*3/uL (ref 140–400)
RBC: 5.32 10*6/uL — ABNORMAL HIGH (ref 3.80–5.10)
RDW: 13.7 % (ref 11.0–15.0)
Total Lymphocyte: 15.7 %
WBC: 9.7 10*3/uL (ref 3.8–10.8)

## 2022-03-15 LAB — LIPID PANEL
Cholesterol: 214 mg/dL — ABNORMAL HIGH (ref <200)
HDL: 39 mg/dL — ABNORMAL LOW (ref >=50)
LDL Cholesterol (Calc): 133 mg/dL (calc) — ABNORMAL HIGH
Non-HDL Cholesterol (Calc): 175 mg/dL (calc) — ABNORMAL HIGH (ref <130)
Total CHOL/HDL Ratio: 5.5 (calc) — ABNORMAL HIGH (ref <5.0)
Triglycerides: 294 mg/dL — ABNORMAL HIGH (ref <150)

## 2022-03-15 LAB — TSH: TSH: 1.82 mIU/L (ref 0.40–4.50)

## 2022-03-15 MED ORDER — EZETIMIBE 10 MG PO TABS: 10.0000 mg | ORAL_TABLET | Freq: Every day | ORAL | 3 refills | Status: DC

## 2022-03-15 NOTE — Telephone Encounter (Signed)
Please call patient Liver, kidney and thyroid function are normal Blood cell counts and electrolytes are normal Cholesterol panel is above goal LDL/bad cholesterol of less than 70.  Her LDL is 133.  Continue atorvastatin 80 mg nightly and start Zetia 10 mg daily.  This was called into her mail-in pharmacy.

## 2022-03-16 NOTE — Telephone Encounter (Signed)
Spoke with pt regarding labs and instructions.   

## 2022-04-12 ENCOUNTER — Ambulatory Visit
Admission: EM | Admit: 2022-04-12 | Discharge: 2022-04-12 | Disposition: A | Discharge status: Home / Self Care | Payer: BC Managed Care – PPO | Attending: Urgent Care | Admitting: Urgent Care

## 2022-04-12 ENCOUNTER — Other Ambulatory Visit: Payer: Self-pay

## 2022-04-12 DIAGNOSIS — U071 COVID-19: Secondary | ICD-10-CM | POA: Diagnosis not present

## 2022-04-12 DIAGNOSIS — E119 Type 2 diabetes mellitus without complications: Secondary | ICD-10-CM | POA: Diagnosis not present

## 2022-04-12 MED ORDER — PAXLOVID (300/100) 20 X 150 MG & 10 X 100MG PO TBPK: ORAL_TABLET | ORAL | 0 refills | Status: DC

## 2022-04-12 MED ORDER — BENZONATATE 100 MG PO CAPS: 100.0000 mg | ORAL_CAPSULE | Freq: Three times a day (TID) | ORAL | 0 refills | Status: DC | PRN

## 2022-04-12 MED ORDER — PROMETHAZINE-DM 6.25-15 MG/5ML PO SYRP: 2.5000 mL | ORAL_SOLUTION | Freq: Three times a day (TID) | ORAL | 0 refills | Status: DC | PRN

## 2022-04-12 NOTE — Discharge Instructions (Addendum)
We will manage this COVID infection by having you start Paxlovid. For sore throat or cough try using a honey-based tea. Use 3 teaspoons of honey with juice squeezed from half lemon. Place shaved pieces of ginger into 1/2-1 cup of water and warm over stove top. Then mix the ingredients and repeat every 4 hours as needed. Please take ibuprofen '600mg'$  every 6 hours with food alternating with OR taken together with Tylenol '500mg'$ -'650mg'$  every 6 hours for throat pain, fevers, aches and pains. Hydrate very well with at least 2 liters of water. Eat light meals such as soups (chicken and noodles, vegetable, chicken and wild rice).  Do not eat foods that you are allergic to.  Taking an antihistamine like Zyrtec can help against postnasal drainage, sinus congestion which can cause sinus pain, sinus headaches, throat pain, painful swallowing, coughing.  You can take this together with pseudoephedrine (Sudafed) at a dose of 30-60 mg 3 times a day or twice daily as needed for the same kind of nasal drip, congestion.  Use cough medications as needed.

## 2022-04-12 NOTE — ED Triage Notes (Signed)
Pt c/o cough, URI sx, body aches x 2 days with +covid exposure-NAD-steady gait

## 2022-04-12 NOTE — ED Provider Notes (Signed)
[facility]  Note:  This document was prepared using Systems analyst and may include unintentional dictation errors.  MRN: [id number] DOB: 11-11-1965  Subjective:   Kristy Cooper is a 57 y.o. female presenting for 2-day history of acute onset sinus congestion, drainage, throat pain, body pains, coughing.  Patient had close exposure to COVID-19 at home.  No history of respiratory disorders.  Patient is a type II diabetic treated without insulin.  No current facility-administered medications for this encounter.  Current Outpatient Medications:    Aspirin-Acetaminophen-Caffeine (EXCEDRIN MIGRAINE PO), Take 3 tablets by mouth daily as needed (migraines.). , Disp: , Rfl:    atorvastatin (LIPITOR) 80 MG tablet, Take 1 tablet (80 mg total) by mouth daily., Disp: 90 tablet, Rfl: 3   beta carotene w/minerals (OCUVITE) tablet, Take 1 tablet by mouth daily., Disp: , Rfl:    Continuous Blood Gluc Receiver (DEXCOM G6 RECEIVER) DEVI, USE TO MONITOR BLOOD       GLUCOSE 3 TIMES A DAY, Disp: 1 each, Rfl: 0   Continuous Blood Gluc Sensor (DEXCOM G6 SENSOR) MISC, USE TO MONITOR BLOOD       GLUCOSE 3 TIMES A DAY, Disp: 3 each, Rfl: 5   Continuous Blood Gluc Transmit (DEXCOM G6 TRANSMITTER) MISC, USE TO MONITOR BLOOD       GLUCOSE 3 TIMES A DAY, Disp: 1 each, Rfl: 3   dapagliflozin propanediol (FARXIGA) 10 MG TABS tablet, Take 1 tablet (10 mg total) by mouth daily before breakfast., Disp: 90 tablet, Rfl: 1   ezetimibe (ZETIA) 10 MG tablet, Take 1 tablet (10 mg total) by mouth daily., Disp: 90 tablet, Rfl: 3   gabapentin (NEURONTIN) 400 MG capsule, Take 1 capsule (400 mg total) by mouth 3 (three) times daily., Disp: 270 capsule, Rfl: 1   metFORMIN (GLUCOPHAGE) 500 MG tablet, Take 1 tablet (500 mg total) by mouth 2 (two) times daily with a meal., Disp: 180 tablet, Rfl: 1   sertraline (ZOLOFT) 100 MG tablet, Take 1 tablet (100 mg total) by mouth daily., Disp: 90 tablet,  Rfl: 1   Allergies  Allergen Reactions   Ibuprofen Swelling    Swelling of eyes and mouth   Meloxicam Swelling   Voltaren [Diclofenac] Swelling    Past Medical History:  Diagnosis Date   Arthralgia of left temporomandibular joint 12/24/2018   Bilateral foot pain    chronic- told arthritis and was prescribed tramadol.    Colon polyp 2014   tubular adenoma   Depression    Depression with anxiety    on meds   Diabetes mellitus without complication ([place])    on meds   GERD (gastroesophageal reflux disease)    hx of-diet controlled   Hyperlipidemia LDL goal <130    on meds   Left breast mass    Obesity    Prediabetes    Right rotator cuff tendinitis 2013   Sciatic leg pain 2019   right leg     Past Surgical History:  Procedure Laterality Date   BREAST LUMPECTOMY WITH RADIOACTIVE SEED LOCALIZATION Left 11/25/2019   Procedure: RADIOACTIVE SEED GUIDED LEFT BREAST LUMPECTOMY;  Surgeon: Coralie Keens, MD;  Location: [facility];  Service: General;  Laterality: Left;  LMA   CESAREAN SECTION     COLONOSCOPY  2016   [facility] Gastro-MAC-prep exc-TA   POLYPECTOMY  2016   TA    Family History  Problem Relation Age of Onset   Colon polyps Mother 58  Breast cancer Mother    Diabetes Mother    Mental illness Mother    Heart disease Mother    Arthritis Mother    Arthritis Father    Mental illness Father    Colon cancer Maternal Aunt 76   Colon polyps Maternal Aunt 30   Breast cancer Maternal Aunt    Diabetes Maternal Aunt    Arthritis Maternal Aunt    Colon polyps Maternal Uncle    Colon cancer Maternal Uncle    Diabetes Maternal Uncle    Colon polyps Paternal Aunt    Colon cancer Paternal Aunt    Diabetes Paternal Aunt    Heart disease Paternal Aunt    Esophageal cancer Neg Hx    Stomach cancer Neg Hx    Rectal cancer Neg Hx    Thyroid cancer Neg Hx     Social History   Tobacco Use   Smoking status: Former   Smokeless tobacco: Never  Brewing technologist Use: Never used  Substance Use Topics   Alcohol use: Yes    Comment: occ   Drug use: No    ROS   Objective:   Vitals: BP 110/74 (BP Location: Left Arm)   Pulse 79   Temp 98.5 F (36.9 C) (Oral)   Resp 20   LMP 10/01/2018 (Approximate)   SpO2 95%   Physical Exam Constitutional:      General: She is not in acute distress.    Appearance: Normal appearance. She is well-developed and normal weight. She is not ill-appearing, toxic-appearing or diaphoretic.  HENT:     Head: Normocephalic and atraumatic.     Right Ear: Tympanic membrane, ear canal and external ear normal. No drainage or tenderness. No middle ear effusion. There is no impacted cerumen. Tympanic membrane is not erythematous or bulging.     Left Ear: Tympanic membrane, ear canal and external ear normal. No drainage or tenderness.  No middle ear effusion. There is no impacted cerumen. Tympanic membrane is not erythematous or bulging.     Nose: Congestion present. No rhinorrhea.     Mouth/Throat:     Mouth: Mucous membranes are moist. No oral lesions.     Pharynx: No pharyngeal swelling, oropharyngeal exudate, posterior oropharyngeal erythema or uvula swelling.     Tonsils: No tonsillar exudate or tonsillar abscesses.  Eyes:     General: No scleral icterus.       Right eye: No discharge.        Left eye: No discharge.     Extraocular Movements: Extraocular movements intact.     Right eye: Normal extraocular motion.     Left eye: Normal extraocular motion.     Conjunctiva/sclera: Conjunctivae normal.  Cardiovascular:     Rate and Rhythm: Normal rate and regular rhythm.     Heart sounds: Normal heart sounds. No murmur heard.    No friction rub. No gallop.  Pulmonary:     Effort: Pulmonary effort is normal. No respiratory distress.     Breath sounds: No stridor. No wheezing, rhonchi or rales.  Chest:     Chest wall: No tenderness.  Musculoskeletal:     Cervical back: Normal range of motion and neck supple.   Lymphadenopathy:     Cervical: No cervical adenopathy.  Skin:    General: Skin is warm and dry.  Neurological:     General: No focal deficit present.     Mental Status: She is alert and oriented to person, place,  and time.  Psychiatric:        Mood and Affect: Mood normal.        Behavior: Behavior normal.     Assessment and Plan :   PDMP not reviewed this encounter.  1. Clinical diagnosis of COVID-19   2. Type 2 diabetes mellitus treated without insulin [facility])     Patient requested a COVID confirmation test.  Start Paxlovid as she is a high risk patient given her chronic conditions, weight and had very close exposure.  Use supportive care otherwise. Deferred imaging given clear cardiopulmonary exam, hemodynamically stable vital signs. Counseled patient on potential for adverse effects with medications prescribed/recommended today, ER and return-to-clinic precautions discussed, patient verbalized understanding.    Jaynee Eagles, PA-C 04/12/22 1309

## 2022-04-13 LAB — SARS CORONAVIRUS 2 (TAT 6-24 HRS): SARS Coronavirus 2: POSITIVE — AB

## 2022-04-25 ENCOUNTER — Encounter: Payer: Self-pay | Admitting: Family Medicine

## 2022-04-25 NOTE — Telephone Encounter (Signed)
Please advise 

## 2022-04-26 NOTE — Telephone Encounter (Signed)
Please inform patient we will need to do immunity status testing to see if she has immunity or not to measles before we can recommend whether or not she would need additional vaccination. If she would like to have this completed, please encourage her to schedule an appointment with this provider to discuss, we will order the lab and if lab indicates needed we will bring her back to give her another MMR.

## 2022-04-30 ENCOUNTER — Other Ambulatory Visit: Payer: Self-pay | Admitting: Family Medicine

## 2022-05-09 ENCOUNTER — Encounter: Payer: Self-pay | Admitting: Family Medicine

## 2022-05-09 ENCOUNTER — Ambulatory Visit: Payer: BC Managed Care – PPO | Admitting: Family Medicine

## 2022-05-09 VITALS — BP 120/75 | HR 82 | Temp 98.4°F | Wt 192.0 lb

## 2022-05-09 DIAGNOSIS — Z0184 Encounter for antibody response examination: Secondary | ICD-10-CM | POA: Diagnosis not present

## 2022-05-09 NOTE — Patient Instructions (Addendum)
Return if symptoms worsen or fail to improve.        Great to see you today.  I have refilled the medication(s) we provide.   If labs were collected, we will inform you of lab results once received either by echart message or telephone call.   - echart message- for normal results that have been seen by the patient already.   - telephone call: abnormal results or if patient has not viewed results in their echart.  

## 2022-05-09 NOTE — Progress Notes (Signed)
Kristy Cooper , June 28, 1965, 57 y.o., female MRN: [id number] Patient Care Team    Relationship Specialty Notifications Start End  [name], DO PCP - General Family Medicine  11/02/15   Tyson Dense, MD Consulting Physician Obstetrics and Gynecology  12/28/21     Chief Complaint  Patient presents with   immunity status requested     Subjective: Kristy Cooper Pt presents for an OV to discuss her immunity status of MMR. She is not certain if she received an MMR and she can not find records. She is traveling soon and wants to make sure she is protected.      03/14/2022    3:43 PM 08/17/2021   10:52 AM 04/19/2021    1:09 PM 02/01/2020    3:44 PM 08/10/2019    1:00 PM  Depression screen PHQ 2/9  Decreased Interest 0 0 0 0 0  Down, Depressed, Hopeless 0 1 0 0 0  PHQ - 2 Score 0 1 0 0 0  Altered sleeping '1 2  1 3  '$ Tired, decreased energy 0 1  0 0  Change in appetite 0 2  0 0  Feeling bad or failure about yourself  0 0  0 0  Trouble concentrating 0 0  0 0  Moving slowly or fidgety/restless 0 0  0 0  Suicidal thoughts 0 0  0 0  PHQ-9 Score '1 6  1 3  '$ Difficult doing work/chores     Not difficult at all    Allergies  Allergen Reactions   Ibuprofen Swelling    Swelling of eyes and mouth   Meloxicam Swelling   Voltaren [Diclofenac] Swelling   Social History   Social History Narrative   Married to [place]. 2 chilrn Bubba Hales)- special needs and an adult child. She also has a grandchild.    HS grad. Artist.   Drinks caffeine. Takes daily vitamin.   Wears seatbelt. Wears bicycle helmet. Smoke detector in the home.    Exercises routinely.    Feels safe in relationships.           Past Medical History:  Diagnosis Date   Arthralgia of left temporomandibular joint 12/24/2018   Bilateral foot pain    chronic- told arthritis and was prescribed tramadol.    Colon polyp 2014   tubular adenoma   Depression    Depression with anxiety    on meds    Diabetes mellitus without complication ([place])    on meds   GERD (gastroesophageal reflux disease)    hx of-diet controlled   Hyperlipidemia LDL goal <130    on meds   Left breast mass    Obesity    Prediabetes    Right rotator cuff tendinitis 2013   Sciatic leg pain 2019   right leg   Past Surgical History:  Procedure Laterality Date   BREAST LUMPECTOMY WITH RADIOACTIVE SEED LOCALIZATION Left 11/25/2019   Procedure: RADIOACTIVE SEED GUIDED LEFT BREAST LUMPECTOMY;  Surgeon: Coralie Keens, MD;  Location: [facility];  Service: General;  Laterality: Left;  LMA   CESAREAN SECTION     COLONOSCOPY  2016   [facility] Gastro-MAC-prep exc-TA   POLYPECTOMY  2016   TA   Family History  Problem Relation Age of Onset   Colon polyps Mother 31   Breast cancer Mother    Diabetes Mother    Mental illness Mother    Heart disease Mother    Arthritis Mother  Arthritis Father    Mental illness Father    Colon cancer Maternal Aunt 53   Colon polyps Maternal Aunt 53   Breast cancer Maternal Aunt    Diabetes Maternal Aunt    Arthritis Maternal Aunt    Colon polyps Maternal Uncle    Colon cancer Maternal Uncle    Diabetes Maternal Uncle    Colon polyps Paternal Aunt    Colon cancer Paternal Aunt    Diabetes Paternal Aunt    Heart disease Paternal Aunt    Esophageal cancer Neg Hx    Stomach cancer Neg Hx    Rectal cancer Neg Hx    Thyroid cancer Neg Hx    Allergies as of 05/09/2022       Reactions   Ibuprofen Swelling   Swelling of eyes and mouth   Meloxicam Swelling   Voltaren [diclofenac] Swelling        Medication List        Accurate as of May 09, 2022  1:25 PM. If you have any questions, ask your nurse or doctor.          STOP taking these medications    benzonatate 100 MG capsule Commonly known as: TESSALON Stopped by: Howard Pouch, DO   Paxlovid (300/100) 20 x 150 MG & 10 x '100MG'$  Tbpk Generic drug: nirmatrelvir & ritonavir Stopped by: Howard Pouch, DO   promethazine-dextromethorphan 6.25-15 MG/5ML syrup Commonly known as: PROMETHAZINE-DM Stopped by: Howard Pouch, DO       TAKE these medications    atorvastatin 80 MG tablet Commonly known as: LIPITOR Take 1 tablet (80 mg total) by mouth daily.   beta carotene w/minerals tablet Take 1 tablet by mouth daily.   dapagliflozin propanediol 10 MG Tabs tablet Commonly known as: Farxiga Take 1 tablet (10 mg total) by mouth daily before breakfast.   Dexcom G6 Receiver Devi USE TO MONITOR BLOOD       GLUCOSE 3 TIMES A DAY   Dexcom G6 Sensor Misc USE TO MONITOR BLOOD       GLUCOSE 3 TIMES A DAY   Dexcom G6 Transmitter Misc USE TO MONITOR BLOOD       GLUCOSE 3 TIMES A DAY   EXCEDRIN MIGRAINE PO Take 3 tablets by mouth daily as needed (migraines.).   ezetimibe 10 MG tablet Commonly known as: Zetia Take 1 tablet (10 mg total) by mouth daily.   gabapentin 400 MG capsule Commonly known as: NEURONTIN Take 1 capsule (400 mg total) by mouth 3 (three) times daily.   metFORMIN 500 MG tablet Commonly known as: GLUCOPHAGE Take 1 tablet (500 mg total) by mouth 2 (two) times daily with a meal.   sertraline 100 MG tablet Commonly known as: ZOLOFT Take 1 tablet (100 mg total) by mouth daily.        All past medical history, surgical history, allergies, family history, immunizations andmedications were updated in the EMR today and reviewed under the history and medication portions of their EMR.     ROS Negative, with the exception of above mentioned in HPI   Objective:  BP 120/75   Pulse 82   Temp 98.4 F (36.9 C)   Wt 192 lb (87.1 kg)   LMP 10/01/2018 (Approximate)   SpO2 94%   BMI 30.07 kg/m  Body mass index is 30.07 kg/m. Physical Exam Vitals and nursing note reviewed.  Constitutional:      General: She is not in acute distress.    Appearance: Normal appearance.  She is normal weight. She is not ill-appearing or toxic-appearing.  HENT:     Head:  Normocephalic and atraumatic.  Eyes:     General: No scleral icterus.       Right eye: No discharge.        Left eye: No discharge.     Extraocular Movements: Extraocular movements intact.     Conjunctiva/sclera: Conjunctivae normal.     Pupils: Pupils are equal, round, and reactive to light.  Skin:    Findings: No rash.  Neurological:     Mental Status: She is alert and oriented to person, place, and time. Mental status is at baseline.     Motor: No weakness.     Coordination: Coordination normal.     Gait: Gait normal.  Psychiatric:        Mood and Affect: Mood normal.        Behavior: Behavior normal.        Thought Content: Thought content normal.        Judgment: Judgment normal.     No results found. No results found. No results found for this or any previous visit (from the past 24 hour(s)).  Assessment/Plan: Kristy Cooper is a 57 y.o. female present for OV for  Immunity status testing - Measles/Mumps/Rubella Immunity - if not immune pt can schedule a nurse visit to receive MMR booster.   Reviewed expectations re: course of current medical issues. Discussed self-management of symptoms. Outlined signs and symptoms indicating need for more acute intervention. Patient verbalized understanding and all questions were answered. Patient received an After-Visit Summary.    Orders Placed This Encounter  Procedures   Measles/Mumps/Rubella Immunity   No orders of the defined types were placed in this encounter.  Referral Orders  No referral(s) requested today     Note is dictated utilizing voice recognition software. Although note has been proof read prior to signing, occasional typographical errors still can be missed. If any questions arise, please do not hesitate to call for verification.   electronically signed by:  Howard Pouch, DO  [place]

## 2022-05-10 LAB — MEASLES/MUMPS/RUBELLA IMMUNITY
Mumps IgG: <9 AU/mL — ABNORMAL LOW
Rubella: 1.09 Index
Rubeola IgG: <13.5 AU/mL — ABNORMAL LOW

## 2022-05-11 ENCOUNTER — Telehealth: Payer: Self-pay | Admitting: Family Medicine

## 2022-05-11 DIAGNOSIS — Z789 Other specified health status: Secondary | ICD-10-CM

## 2022-05-11 NOTE — Telephone Encounter (Signed)
Please inform patient she does not have immunity against measles or mumps.  She does have immunity against rubella. I would encourage her to schedule a nurse visit to have an MMR vaccine to boost her immunity.  Only 1 vaccine is needed

## 2022-05-11 NOTE — Telephone Encounter (Signed)
Spoke with patient regarding results/recommendations.  

## 2022-05-13 ENCOUNTER — Other Ambulatory Visit: Payer: Self-pay | Admitting: Family Medicine

## 2022-05-14 ENCOUNTER — Ambulatory Visit: Payer: BC Managed Care – PPO

## 2022-05-16 ENCOUNTER — Ambulatory Visit (INDEPENDENT_AMBULATORY_CARE_PROVIDER_SITE_OTHER): Payer: BC Managed Care – PPO

## 2022-05-16 DIAGNOSIS — Z23 Encounter for immunization: Secondary | ICD-10-CM | POA: Diagnosis not present

## 2022-05-16 NOTE — Progress Notes (Signed)
Pt here today MMR vax. Shot was given with no issue. Rx provided by office.

## 2022-06-02 ENCOUNTER — Encounter: Payer: Self-pay | Admitting: Family Medicine

## 2022-06-04 MED ORDER — EZETIMIBE 10 MG PO TABS: 10.0000 mg | ORAL_TABLET | Freq: Every day | ORAL | 2 refills | Status: DC

## 2022-06-04 MED ORDER — SERTRALINE HCL 100 MG PO TABS: 100.0000 mg | ORAL_TABLET | Freq: Every day | ORAL | 1 refills | Status: DC

## 2022-06-04 MED ORDER — ATORVASTATIN CALCIUM 80 MG PO TABS: 80.0000 mg | ORAL_TABLET | Freq: Every day | ORAL | 2 refills | Status: DC

## 2022-06-04 MED ORDER — GABAPENTIN 400 MG PO CAPS: 400.0000 mg | ORAL_CAPSULE | Freq: Three times a day (TID) | ORAL | 0 refills | Status: DC

## 2022-06-04 MED ORDER — METFORMIN HCL 500 MG PO TABS: 500.0000 mg | ORAL_TABLET | Freq: Two times a day (BID) | ORAL | 0 refills | Status: DC

## 2022-06-04 MED ORDER — DAPAGLIFLOZIN PROPANEDIOL 10 MG PO TABS: 10.0000 mg | ORAL_TABLET | Freq: Every day | ORAL | 0 refills | Status: DC

## 2022-06-04 NOTE — Telephone Encounter (Signed)
No further action needed.

## 2022-06-05 ENCOUNTER — Other Ambulatory Visit: Payer: Self-pay | Admitting: Family Medicine

## 2022-06-06 MED ORDER — DEXCOM G6 SENSOR MISC: 0 refills | Status: DC

## 2022-06-27 ENCOUNTER — Encounter: Payer: Self-pay | Admitting: Family Medicine

## 2022-06-27 ENCOUNTER — Ambulatory Visit (INDEPENDENT_AMBULATORY_CARE_PROVIDER_SITE_OTHER): Payer: BC Managed Care – PPO | Admitting: Family Medicine

## 2022-06-27 VITALS — BP 117/81 | HR 82 | Temp 98.0°F | Wt 189.4 lb

## 2022-06-27 DIAGNOSIS — E785 Hyperlipidemia, unspecified: Secondary | ICD-10-CM

## 2022-06-27 DIAGNOSIS — Z79899 Other long term (current) drug therapy: Secondary | ICD-10-CM | POA: Diagnosis not present

## 2022-06-27 DIAGNOSIS — E1169 Type 2 diabetes mellitus with other specified complication: Secondary | ICD-10-CM

## 2022-06-27 DIAGNOSIS — M5136 Other intervertebral disc degeneration, lumbar region: Secondary | ICD-10-CM | POA: Diagnosis not present

## 2022-06-27 DIAGNOSIS — M5416 Radiculopathy, lumbar region: Secondary | ICD-10-CM | POA: Diagnosis not present

## 2022-06-27 LAB — POCT GLYCOSYLATED HEMOGLOBIN (HGB A1C)
HbA1c POC (<> result, manual entry): 7.2 % (ref 4.0–5.6)
HbA1c, POC (controlled diabetic range): 7.2 % — AB (ref 0.0–7.0)
HbA1c, POC (prediabetic range): 7.2 % — AB (ref 5.7–6.4)
Hemoglobin A1C: 7.2 % — AB (ref 4.0–5.6)

## 2022-06-27 MED ORDER — DAPAGLIFLOZIN PROPANEDIOL 10 MG PO TABS: 10.0000 mg | ORAL_TABLET | Freq: Every day | ORAL | 1 refills | Status: DC

## 2022-06-27 MED ORDER — SERTRALINE HCL 100 MG PO TABS: 100.0000 mg | ORAL_TABLET | Freq: Every day | ORAL | 1 refills | Status: DC

## 2022-06-27 MED ORDER — METFORMIN HCL 500 MG PO TABS: 500.0000 mg | ORAL_TABLET | Freq: Two times a day (BID) | ORAL | 0 refills | Status: DC

## 2022-06-27 MED ORDER — GABAPENTIN 400 MG PO CAPS: 400.0000 mg | ORAL_CAPSULE | Freq: Three times a day (TID) | ORAL | 0 refills | Status: DC

## 2022-06-27 NOTE — Progress Notes (Signed)
Patient Care Team    Relationship Specialty Notifications Start End  [name], DO PCP - General Family Medicine  11/02/15   Tyson Dense, MD Consulting Physician Obstetrics and Gynecology  12/28/21      SUBJECTIVE Chief Complaint  Patient presents with   Diabetes    HPI: Kristy Cooper is a  57 y.o. female presents today for chronic medical conditions management Adjustment disorder with mixed anxiety and depressed mood She reports compliance with Zoloft 100 mg daily and feels it is working well for her at this dose.   diabetes/obesity Pt reports compliance with metformin 500 BID.   Tried: Ozempic-patient tolerated, insurance declined.  Victoza-too expensive, Wilder Glade too expensive Patient denies dizziness, hyperglycemic or hypoglycemic events. Patient denies numbness, tingling in the extremities or nonhealing wounds of feet.     Hyperlipidemia, unspecified hyperlipidemia type/ Hypertriglyceridemia Patient has a history of hyperlipidemia and hypertriglyceridemia.fhx HD.    She reports compliance with lipitor 80 mg qd.   works great for her lumbar pain.  Low back pain/ DDD (degenerative disc disease),/ Lumbar radiculopathy: Patient reports compliance with gabapentin 400 mg 3 times a day.  She feels it is working very well for her lower back radiculopathy. Prior note This is a new problem for patient. She states she has had right-sided low back pain for about one month. There was no increase in activity, lifting or known injury acutely. She states she did have an injury when she was a young child, which did not need any medical intervention, but nothing recent. However over the last month she started with right sided lower back pain, that radiated into her buttock and now is radiating down to her right foot. She reports tingling sensation intermittently in her right foot. She denies any weakness. She denies any bladder or bowel changes.      03/14/2022    3:43 PM  08/17/2021   10:52 AM 04/19/2021    1:09 PM 02/01/2020    3:44 PM 08/10/2019    1:00 PM  Depression screen PHQ 2/9  Decreased Interest 0 0 0 0 0  Down, Depressed, Hopeless 0 1 0 0 0  PHQ - 2 Score 0 1 0 0 0  Altered sleeping 1 2  1 3   Tired, decreased energy 0 1  0 0  Change in appetite 0 2  0 0  Feeling bad or failure about yourself  0 0  0 0  Trouble concentrating 0 0  0 0  Moving slowly or fidgety/restless 0 0  0 0  Suicidal thoughts 0 0  0 0  PHQ-9 Score 1 6  1 3   Difficult doing work/chores     Not difficult at all      03/14/2022    3:43 PM 08/17/2021   10:52 AM 02/01/2020    3:44 PM 08/10/2019    1:00 PM  GAD 7 : Generalized Anxiety Score  Nervous, Anxious, on Edge 0 0 0 0  Control/stop worrying 0 2 1 3   Worry too much - different things 0 2 1 3   Trouble relaxing 0 1 0 0  Restless 0 1 0 0  Easily annoyed or irritable 0 0 0 0  Afraid - awful might happen 0 0 0 0  Total GAD 7 Score 0 6 2 6   Anxiety Difficulty    Not difficult at all    ROS: See pertinent positives and negatives per HPI.  Patient Active Problem List   Diagnosis Date Noted  Not immune to measles 05/11/2022   Type 2 diabetes mellitus with hyperlipidemia (HCC)-LDL goal <100 11/23/2020   BMI 30.0-30.9,adult 11/23/2020   On statin therapy 11/23/2020   DDD (degenerative disc disease), lumbar 09/03/2016   Lumbar radiculopathy 09/03/2016   Adjustment disorder with mixed anxiety and depressed mood 07/30/2016   History of colon polyps 11/02/2015   Morbid obesity ([place]) 11/02/2015    Social History   Tobacco Use   Smoking status: Former   Smokeless tobacco: Never  Substance Use Topics   Alcohol use: Yes    Comment: occ    Current Outpatient Medications:    Aspirin-Acetaminophen-Caffeine (EXCEDRIN MIGRAINE PO), Take 3 tablets by mouth daily as needed (migraines.). , Disp: , Rfl:    atorvastatin (LIPITOR) 80 MG tablet, Take 1 tablet (80 mg total) by mouth daily., Disp: 90 tablet, Rfl: 2   Continuous  Blood Gluc Receiver (DEXCOM G6 RECEIVER) DEVI, USE TO MONITOR BLOOD       GLUCOSE 3 TIMES A DAY, Disp: 1 each, Rfl: 0   Continuous Blood Gluc Sensor (DEXCOM G6 SENSOR) MISC, USE TO MONITOR BLOOD       GLUCOSE 3 TIMES A DAY, Disp: 3 each, Rfl: 0   Continuous Blood Gluc Transmit (DEXCOM G6 TRANSMITTER) MISC, USE TO MONITOR BLOOD       GLUCOSE 3 TIMES A DAY, Disp: 1 each, Rfl: 3   ezetimibe (ZETIA) 10 MG tablet, Take 1 tablet (10 mg total) by mouth daily., Disp: 90 tablet, Rfl: 2   dapagliflozin propanediol (FARXIGA) 10 MG TABS tablet, Take 1 tablet (10 mg total) by mouth daily before breakfast., Disp: 90 tablet, Rfl: 1   gabapentin (NEURONTIN) 400 MG capsule, Take 1 capsule (400 mg total) by mouth 3 (three) times daily., Disp: 270 capsule, Rfl: 0   metFORMIN (GLUCOPHAGE) 500 MG tablet, Take 1 tablet (500 mg total) by mouth 2 (two) times daily with a meal., Disp: 180 tablet, Rfl: 0   sertraline (ZOLOFT) 100 MG tablet, Take 1 tablet (100 mg total) by mouth daily., Disp: 90 tablet, Rfl: 1  Allergies  Allergen Reactions   Ibuprofen Swelling    Swelling of eyes and mouth   Meloxicam Swelling   Voltaren [Diclofenac] Swelling    OBJECTIVE: BP 117/81   Pulse 82   Temp 98 F (36.7 C)   Wt 189 lb 6.4 oz (85.9 kg)   LMP 10/01/2018 (Approximate)   SpO2 96%   BMI 29.66 kg/m  Physical Exam Vitals and nursing note reviewed.  Constitutional:      General: She is not in acute distress.    Appearance: Normal appearance. She is obese. She is not ill-appearing, toxic-appearing or diaphoretic.  HENT:     Head: Normocephalic and atraumatic.  Eyes:     General: No scleral icterus.       Right eye: No discharge.        Left eye: No discharge.     Extraocular Movements: Extraocular movements intact.     Conjunctiva/sclera: Conjunctivae normal.     Pupils: Pupils are equal, round, and reactive to light.  Cardiovascular:     Rate and Rhythm: Normal rate and regular rhythm.     Heart sounds: No murmur  heard. Pulmonary:     Effort: Pulmonary effort is normal. No respiratory distress.     Breath sounds: Normal breath sounds. No wheezing, rhonchi or rales.  Musculoskeletal:     Right lower leg: No edema.     Left lower leg: No edema.  Skin:    Findings: No rash.  Neurological:     Mental Status: She is alert and oriented to person, place, and time. Mental status is at baseline.  Psychiatric:        Mood and Affect: Mood normal.        Behavior: Behavior normal.        Thought Content: Thought content normal.        Judgment: Judgment normal.    Results for orders placed or performed in visit on 06/27/22 (from the past 48 hour(s))  POCT HgB A1C     Status: Abnormal   Collection Time: 06/27/22  1:09 PM  Result Value Ref Range   Hemoglobin A1C 7.2 (A) 4.0 - 5.6 %   HbA1c POC (<> result, manual entry) 7.2 4.0 - 5.6 %   HbA1c, POC (prediabetic range) 7.2 (A) 5.7 - 6.4 %   HbA1c, POC (controlled diabetic range) 7.2 (A) 0.0 - 7.0 %     ASSESSMENT AND PLAN: Jeraline Doolin is a 57 y.o. female present for  Adjustment disorder with mixed anxiety and depressed mood Stable Continue Zoloft 100 mg daily.  We discussed maybe trying a different medication in the future with less weight gain profile. - Follow-up in 4 months, sooner if needed.   Lumbar radiculopathy Stable Continue gabapentin 400 mg 3 times daily.  - f/u 76ms unless needed sooner.   diabetes with hyperlipidemia. Stable Continue metformin 500 mg twice daily> she is already having GI SE and will not tolerate a higher dose.  Discussed increasing exercise and she will try this first.  Continue Farxiga 10 mg daily Tried: Ozempic (too expensive), Victoza (too expensive), Farxiga too expensive Discussed diabetic diet. Increase exercise. Ophthalmology up-to-date 05/2021 Foot exam completed  08/17/2021 Microalbumin UTD 08/17/2021 Influenza vaccine UTD 01/2022 Prevnar 20 UTD 2022 Last a1c 6.3> 6.6 > 6.6> 7.6> 7.0>6.5 > 6.6 >  6.9>7.2 a1C today  Hyperlipidemia, unspecified hyperlipidemia type/ Hypertriglyceridemia/ on statin therapy.  -Fhx HD present in mother.  - diet and exercise.  Continue atorvastatin 80 mg daily.  Labs up-to-date 03/2022 Follow-up in 4 months   Orders Placed This Encounter  Procedures   POCT HgB A1C   Meds ordered this encounter  Medications   sertraline (ZOLOFT) 100 MG tablet    Sig: Take 1 tablet (100 mg total) by mouth daily.    Dispense:  90 tablet    Refill:  1   metFORMIN (GLUCOPHAGE) 500 MG tablet    Sig: Take 1 tablet (500 mg total) by mouth 2 (two) times daily with a meal.    Dispense:  180 tablet    Refill:  0   gabapentin (NEURONTIN) 400 MG capsule    Sig: Take 1 capsule (400 mg total) by mouth 3 (three) times daily.    Dispense:  270 capsule    Refill:  0   dapagliflozin propanediol (FARXIGA) 10 MG TABS tablet    Sig: Take 1 tablet (10 mg total) by mouth daily before breakfast.    Dispense:  90 tablet    Refill:  1    Referral Orders  No referral(s) requested today    Howard Pouch, DO 06/27/2022

## 2022-06-27 NOTE — Patient Instructions (Addendum)
Return in about 4 months (around 10/15/2022) for Routine chronic condition follow-up.        Great to see you today.  I have refilled the medication(s) we provide.   If labs were collected, we will inform you of lab results once received either by echart message or telephone call.   - echart message- for normal results that have been seen by the patient already.   - telephone call: abnormal results or if patient has not viewed results in their echart.  

## 2022-07-04 ENCOUNTER — Encounter: Payer: Self-pay | Admitting: Family Medicine

## 2022-07-04 MED ORDER — DEXCOM G7 RECEIVER DEVI: 0 refills | Status: AC

## 2022-07-04 MED ORDER — DEXCOM G7 SENSOR MISC: 0 refills | Status: DC

## 2022-07-04 NOTE — Addendum Note (Signed)
Addended by: Beatrix Fetters on: 07/04/2022 03:33 PM   Modules accepted: Orders

## 2022-07-04 NOTE — Telephone Encounter (Signed)
Okay to send to pharmacy of choice

## 2022-07-29 ENCOUNTER — Other Ambulatory Visit: Payer: Self-pay | Admitting: Family Medicine

## 2022-08-24 ENCOUNTER — Other Ambulatory Visit: Payer: Self-pay | Admitting: Family Medicine

## 2022-09-13 ENCOUNTER — Encounter: Payer: Self-pay | Admitting: Family Medicine

## 2022-09-13 ENCOUNTER — Ambulatory Visit: Payer: BC Managed Care – PPO | Admitting: Family Medicine

## 2022-09-13 VITALS — BP 106/71 | HR 74 | Temp 97.9°F | Wt 185.8 lb

## 2022-09-13 DIAGNOSIS — R350 Frequency of micturition: Secondary | ICD-10-CM | POA: Diagnosis not present

## 2022-09-13 DIAGNOSIS — E785 Hyperlipidemia, unspecified: Secondary | ICD-10-CM

## 2022-09-13 DIAGNOSIS — E1169 Type 2 diabetes mellitus with other specified complication: Secondary | ICD-10-CM | POA: Diagnosis not present

## 2022-09-13 LAB — POC URINALSYSI DIPSTICK (AUTOMATED)
Bilirubin, UA: NEGATIVE
Blood, UA: NEGATIVE
Glucose, UA: POSITIVE — AB
Ketones, UA: NEGATIVE
Leukocytes, UA: NEGATIVE
Nitrite, UA: NEGATIVE
Protein, UA: NEGATIVE
Spec Grav, UA: 1.025 (ref 1.010–1.025)
Urobilinogen, UA: 1 E.U./dL
pH, UA: 6 (ref 5.0–8.0)

## 2022-09-13 LAB — MICROALBUMIN / CREATININE URINE RATIO
Creatinine,U: 70.1 mg/dL
Microalb Creat Ratio: 2.9 mg/g (ref 0.0–30.0)
Microalb, Ur: 2 mg/dL — ABNORMAL HIGH (ref 0.0–1.9)

## 2022-09-13 MED ORDER — CEPHALEXIN 500 MG PO CAPS: 500.0000 mg | ORAL_CAPSULE | Freq: Three times a day (TID) | ORAL | 0 refills | Status: DC

## 2022-09-13 NOTE — Progress Notes (Signed)
Kristy Cooper , [birth year], 57 y.o., female MRN: [id number] Patient Care Team    Relationship Specialty Notifications Start End  Natalia Leatherwood, DO PCP - General Family Medicine  11/02/15   Ranae Pila, MD Consulting Physician Obstetrics and Gynecology  12/28/21     Chief Complaint  Patient presents with   Urinary Frequency    Few weeks; nausea and occasional flank pain     Subjective: Kristy Cooper is a 57 y.o. Pt presents for an OV with complaints of urinary freq. of 2 weeks duration.  Associated symptoms include bilateral flank pain. Similar symptoms 12/2020, renal stone CT normal, no stone present. Pt has tried nothing to ease their symptoms.  She endorses heartburn and has been using Prilosec OTC. She has been eating more citrus fruits recently. She is taking [place], this is not new since increase in frequency of urine She denies fevers or chills.  She denies dysuria.     03/14/2022    3:43 PM 08/17/2021   10:52 AM 04/19/2021    1:09 PM 02/01/2020    3:44 PM 08/10/2019    1:00 PM  Depression screen PHQ 2/9  Decreased Interest 0 0 0 0 0  Down, Depressed, Hopeless 0 1 0 0 0  PHQ - 2 Score 0 1 0 0 0  Altered sleeping 1 2  1 3   Tired, decreased energy 0 1  0 0  Change in appetite 0 2  0 0  Feeling bad or failure about yourself  0 0  0 0  Trouble concentrating 0 0  0 0  Moving slowly or fidgety/restless 0 0  0 0  Suicidal thoughts 0 0  0 0  PHQ-9 Score 1 6  1 3   Difficult doing work/chores     Not difficult at all    Allergies  Allergen Reactions   Ibuprofen Swelling    Swelling of eyes and mouth   Meloxicam Swelling   Voltaren [Diclofenac] Swelling   Social History   Social History Narrative   Married to [place]. 2 chilrn Jaclynn Guarneri)- special needs and an adult child. She also has a grandchild.    HS grad. Artist.   Drinks caffeine. Takes daily vitamin.   Wears seatbelt. Wears bicycle helmet. Smoke detector in the home.    Exercises  routinely.    Feels safe in relationships.           Past Medical History:  Diagnosis Date   Arthralgia of left temporomandibular joint 12/24/2018   Bilateral foot pain    chronic- told arthritis and was prescribed tramadol.    Colon polyp 2014   tubular adenoma   Depression    Depression with anxiety    on meds   Diabetes mellitus without complication (HCC)    on meds   GERD (gastroesophageal reflux disease)    hx of-diet controlled   Hyperlipidemia LDL goal <130    on meds   Left breast mass    Obesity    Prediabetes    Right rotator cuff tendinitis 2013   Sciatic leg pain 2019   right leg   Past Surgical History:  Procedure Laterality Date   BREAST LUMPECTOMY WITH RADIOACTIVE SEED LOCALIZATION Left 11/25/2019   Procedure: RADIOACTIVE SEED GUIDED LEFT BREAST LUMPECTOMY;  Surgeon: Abigail Miyamoto, MD;  Location: [facility];  Service: General;  Laterality: Left;  LMA   CESAREAN SECTION     COLONOSCOPY  2016   [facility]astro-MAC-prep  exc-TA   POLYPECTOMY  2016   TA   Family History  Problem Relation Age of Onset   Colon polyps Mother 30   Breast cancer Mother    Diabetes Mother    Mental illness Mother    Heart disease Mother    Arthritis Mother    Arthritis Father    Mental illness Father    Colon cancer Maternal Aunt 46   Colon polyps Maternal Aunt 7   Breast cancer Maternal Aunt    Diabetes Maternal Aunt    Arthritis Maternal Aunt    Colon polyps Maternal Uncle    Colon cancer Maternal Uncle    Diabetes Maternal Uncle    Colon polyps Paternal Aunt    Colon cancer Paternal Aunt    Diabetes Paternal Aunt    Heart disease Paternal Aunt    Esophageal cancer Neg Hx    Stomach cancer Neg Hx    Rectal cancer Neg Hx    Thyroid cancer Neg Hx    Allergies as of 09/13/2022       Reactions   Ibuprofen Swelling   Swelling of eyes and mouth   Meloxicam Swelling   Voltaren [diclofenac] Swelling        Medication List        Accurate as of  September 13, 2022 12:42 PM. If you have any questions, ask your nurse or doctor.          atorvastatin 80 MG tablet Commonly known as: LIPITOR Take 1 tablet (80 mg total) by mouth daily.   cephALEXin 500 MG capsule Commonly known as: KEFLEX Take 1 capsule (500 mg total) by mouth 3 (three) times daily. Started by: Felix Pacini, DO   dapagliflozin propanediol 10 MG Tabs tablet Commonly known as: Farxiga Take 1 tablet (10 mg total) by mouth daily before breakfast.   Dexcom G6 Transmitter Misc USE TO MONITOR BLOOD       GLUCOSE 3 TIMES A DAY   Dexcom G7 Receiver Devi USE TO MONITOR BLOOD GLUCOSE 3 TIMES A DAY   Dexcom G7 Sensor Misc USE TO MONITOR BLOOD GLUCOSE 3 TIMES A DAY   EXCEDRIN MIGRAINE PO Take 3 tablets by mouth daily as needed (migraines.).   ezetimibe 10 MG tablet Commonly known as: Zetia Take 1 tablet (10 mg total) by mouth daily.   gabapentin 400 MG capsule Commonly known as: NEURONTIN Take 1 capsule (400 mg total) by mouth 3 (three) times daily.   metFORMIN 500 MG tablet Commonly known as: GLUCOPHAGE Take 1 tablet (500 mg total) by mouth 2 (two) times daily with a meal.   sertraline 100 MG tablet Commonly known as: ZOLOFT Take 1 tablet (100 mg total) by mouth daily.        All past medical history, surgical history, allergies, family history, immunizations andmedications were updated in the EMR today and reviewed under the history and medication portions of their EMR.     ROS Negative, with the exception of above mentioned in HPI   Objective:  BP 106/71   Pulse 74   Temp 97.9 F (36.6 C)   Wt 185 lb 12.8 oz (84.3 kg)   LMP 10/01/2018 (Approximate)   SpO2 97%   BMI 29.10 kg/m  Body mass index is 29.1 kg/m.  Physical Exam Vitals and nursing note reviewed.  Constitutional:      General: She is not in acute distress.    Appearance: Normal appearance. She is normal weight. She is not ill-appearing or toxic-appearing.  HENT:     Head:  Normocephalic and atraumatic.  Eyes:     General: No scleral icterus.       Right eye: No discharge.        Left eye: No discharge.     Extraocular Movements: Extraocular movements intact.     Conjunctiva/sclera: Conjunctivae normal.     Pupils: Pupils are equal, round, and reactive to light.  Abdominal:     General: Abdomen is flat. There is no distension.     Palpations: Abdomen is soft. There is no mass.     Tenderness: There is no abdominal tenderness. There is no right CVA tenderness, left CVA tenderness, guarding or rebound.  Skin:    Findings: No rash.  Neurological:     Mental Status: She is alert and oriented to person, place, and time. Mental status is at baseline.     Motor: No weakness.     Coordination: Coordination normal.     Gait: Gait normal.  Psychiatric:        Mood and Affect: Mood normal.        Behavior: Behavior normal.        Thought Content: Thought content normal.        Judgment: Judgment normal.      No results found. No results found. Results for orders placed or performed in visit on 09/13/22 (from the past 24 hour(s))  POCT Urinalysis Dipstick (Automated)     Status: Abnormal   Collection Time: 09/13/22 10:25 AM  Result Value Ref Range   Color, UA yellow    Clarity, UA clear    Glucose, UA Positive (A) Negative   Bilirubin, UA neg    Ketones, UA neg    Spec Grav, UA 1.025 1.010 - 1.025   Blood, UA neg    pH, UA 6.0 5.0 - 8.0   Protein, UA Negative Negative   Urobilinogen, UA 1.0 0.2 or 1.0 E.U./dL   Nitrite, UA neg    Leukocytes, UA Negative Negative    Assessment/Plan: Mariem Crute is a 57 y.o. female present for OV for  Urinary frequency Urine is only positive for glucose.  She reports her sugars been running around 150s.  Urine does not look particularly infectious and does not have red blood cells. Send for urinalysis with reflex culture.  In the meantime elected to treat prophylactically with Keflex 3 times daily. Patient  was given handout of common bladder irritants to avoid.  I suspect some of her symptoms are contributed to the citrus fruits she has been eating over the last couple weeks causing some bladder irritation, seems that is also causing her more heartburn as well.  Encouraged her to cut back on citrus fruits in particular but all spicy and acidic foods for now.  She can continue the Prilosec OTC. She was educated [place] can cause increase in urine output. - Urinalysis w microscopic + reflex cultur - POCT Urinalysis Dipstick (Automated)  Type 2 diabetes mellitus with hyperlipidemia (HCC)-LDL goal <100 - Urine Microalbumin w/creat. ratio  Reviewed expectations re: course of current medical issues. Discussed self-management of symptoms. Outlined signs and symptoms indicating need for more acute intervention. Patient verbalized understanding and all questions were answered. Patient received an After-Visit Summary.    Orders Placed This Encounter  Procedures   Urinalysis w microscopic + reflex cultur   Urine Microalbumin w/creat. ratio   POCT Urinalysis Dipstick (Automated)   Meds ordered this encounter  Medications   cephALEXin (KEFLEX) 500  MG capsule    Sig: Take 1 capsule (500 mg total) by mouth 3 (three) times daily.    Dispense:  21 capsule    Refill:  0   Referral Orders  No referral(s) requested today     Note is dictated utilizing voice recognition software. Although note has been proof read prior to signing, occasional typographical errors still can be missed. If any questions arise, please do not hesitate to call for verification.   electronically signed by:  Felix Pacini, DO  [facility] - [place]

## 2022-09-13 NOTE — Patient Instructions (Signed)
Return in about 6 weeks (around 10/24/2022), or if symptoms worsen or fail to improve, for Routine chronic condition follow-up.        Great to see you today.  I have refilled the medication(s) we provide.   If labs were collected, we will inform you of lab results once received either by echart message or telephone call.   - echart message- for normal results that have been seen by the patient already.   - telephone call: abnormal results or if patient has not viewed results in their echart.

## 2022-09-14 ENCOUNTER — Telehealth: Payer: Self-pay | Admitting: Family Medicine

## 2022-09-14 LAB — URINALYSIS W MICROSCOPIC + REFLEX CULTURE
Bacteria, UA: NONE SEEN /HPF
Bilirubin Urine: NEGATIVE
Hgb urine dipstick: NEGATIVE
Hyaline Cast: NONE SEEN /LPF
Ketones, ur: NEGATIVE
Leukocyte Esterase: NEGATIVE
Nitrites, Initial: NEGATIVE
Protein, ur: NEGATIVE
Specific Gravity, Urine: 1.038 — ABNORMAL HIGH (ref 1.001–1.035)
Yeast: NONE SEEN /HPF
pH: 5.5 (ref 5.0–8.0)

## 2022-09-14 LAB — NO CULTURE INDICATED

## 2022-09-14 MED ORDER — TAMSULOSIN HCL 0.4 MG PO CAPS: 0.4000 mg | ORAL_CAPSULE | Freq: Every day | ORAL | 0 refills | Status: DC

## 2022-09-14 NOTE — Telephone Encounter (Signed)
Spoke with patient regarding results/recommendations.  

## 2022-09-14 NOTE — Telephone Encounter (Signed)
Please inform patient Her urine results so far appear to have the presence of small crystals.  Her symptoms may be secondary to small kidney stone.  Her to take the antibiotic prescribed and also start a medication I called in today called Flomax.  She is to start this daily that helps kidney stones pass  We will call her early next week with the urine culture.  If her symptoms are worsening over the weekend I would recommend she be seen in the emergency room for possible stone presence.  I

## 2022-09-30 ENCOUNTER — Encounter: Payer: Self-pay | Admitting: Family Medicine

## 2022-10-01 ENCOUNTER — Other Ambulatory Visit: Payer: Self-pay

## 2022-10-01 MED ORDER — DEXCOM G7 SENSOR MISC: 1 refills | Status: DC

## 2022-10-07 ENCOUNTER — Other Ambulatory Visit: Payer: Self-pay | Admitting: Family Medicine

## 2022-11-17 ENCOUNTER — Encounter (HOSPITAL_BASED_OUTPATIENT_CLINIC_OR_DEPARTMENT_OTHER): Payer: Self-pay

## 2022-11-17 ENCOUNTER — Emergency Department (HOSPITAL_BASED_OUTPATIENT_CLINIC_OR_DEPARTMENT_OTHER)
Admission: EM | Admit: 2022-11-17 | Discharge: 2022-11-17 | Disposition: A | Discharge status: Home / Self Care | Payer: BC Managed Care – PPO | Attending: Emergency Medicine | Admitting: Emergency Medicine

## 2022-11-17 ENCOUNTER — Emergency Department (HOSPITAL_BASED_OUTPATIENT_CLINIC_OR_DEPARTMENT_OTHER): Payer: BC Managed Care – PPO

## 2022-11-17 DIAGNOSIS — R400 Somnolence: Secondary | ICD-10-CM | POA: Diagnosis not present

## 2022-11-17 DIAGNOSIS — Z7982 Long term (current) use of aspirin: Secondary | ICD-10-CM | POA: Insufficient documentation

## 2022-11-17 DIAGNOSIS — E119 Type 2 diabetes mellitus without complications: Secondary | ICD-10-CM | POA: Insufficient documentation

## 2022-11-17 DIAGNOSIS — R739 Hyperglycemia, unspecified: Secondary | ICD-10-CM | POA: Diagnosis not present

## 2022-11-17 DIAGNOSIS — R457 State of emotional shock and stress, unspecified: Secondary | ICD-10-CM | POA: Diagnosis not present

## 2022-11-17 DIAGNOSIS — R110 Nausea: Secondary | ICD-10-CM | POA: Diagnosis not present

## 2022-11-17 DIAGNOSIS — F1290 Cannabis use, unspecified, uncomplicated: Secondary | ICD-10-CM | POA: Insufficient documentation

## 2022-11-17 DIAGNOSIS — Z7984 Long term (current) use of oral hypoglycemic drugs: Secondary | ICD-10-CM | POA: Diagnosis not present

## 2022-11-17 DIAGNOSIS — R42 Dizziness and giddiness: Secondary | ICD-10-CM | POA: Diagnosis not present

## 2022-11-17 LAB — BASIC METABOLIC PANEL
Anion gap: 10 (ref 5–15)
BUN: 23 mg/dL — ABNORMAL HIGH (ref 6–20)
CO2: 22 mmol/L (ref 22–32)
Calcium: 9.5 mg/dL (ref 8.9–10.3)
Chloride: 106 mmol/L (ref 98–111)
Creatinine, Ser: 0.69 mg/dL (ref 0.44–1.00)
GFR, Estimated: >60 mL/min (ref >60)
Glucose, Bld: 223 mg/dL — ABNORMAL HIGH (ref 70–99)
Potassium: 3.7 mmol/L (ref 3.5–5.1)
Sodium: 138 mmol/L (ref 135–145)

## 2022-11-17 LAB — RAPID URINE DRUG SCREEN, HOSP PERFORMED
Amphetamines: NOT DETECTED
Barbiturates: NOT DETECTED
Benzodiazepines: NOT DETECTED
Cocaine: NOT DETECTED
Opiates: NOT DETECTED
Tetrahydrocannabinol: POSITIVE — AB

## 2022-11-17 LAB — URINALYSIS, ROUTINE W REFLEX MICROSCOPIC
Bilirubin Urine: NEGATIVE
Glucose, UA: >=500 mg/dL — AB
Ketones, ur: NEGATIVE mg/dL
Leukocytes,Ua: NEGATIVE
Nitrite: NEGATIVE
Protein, ur: NEGATIVE mg/dL
Specific Gravity, Urine: 1.025 (ref 1.005–1.030)
pH: 5.5 (ref 5.0–8.0)

## 2022-11-17 LAB — URINALYSIS, MICROSCOPIC (REFLEX)

## 2022-11-17 LAB — CBC
HCT: 41.8 % (ref 36.0–46.0)
Hemoglobin: 13.6 g/dL (ref 12.0–15.0)
MCH: 26.5 pg (ref 26.0–34.0)
MCHC: 32.5 g/dL (ref 30.0–36.0)
MCV: 81.3 fL (ref 80.0–100.0)
Platelets: 289 10*3/uL (ref 150–400)
RBC: 5.14 MIL/uL — ABNORMAL HIGH (ref 3.87–5.11)
RDW: 14.2 % (ref 11.5–15.5)
WBC: 10.4 10*3/uL (ref 4.0–10.5)
nRBC: 0 % (ref 0.0–0.2)

## 2022-11-17 LAB — CBG MONITORING, ED: Glucose-Capillary: 183 mg/dL — ABNORMAL HIGH (ref 70–99)

## 2022-11-17 MED ORDER — ONDANSETRON HCL 4 MG/2ML IJ SOLN
4.0000 mg | Freq: Once | INTRAMUSCULAR | Status: AC
  Administered 2022-11-17: 4 mg via INTRAVENOUS
  Filled 2022-11-17: qty 2

## 2022-11-17 MED ORDER — MECLIZINE HCL 25 MG PO TABS: 25.0000 mg | ORAL_TABLET | Freq: Three times a day (TID) | ORAL | 0 refills | Status: AC | PRN

## 2022-11-17 MED ORDER — LACTATED RINGERS IV BOLUS
1000.0000 mL | Freq: Once | INTRAVENOUS | Status: AC
  Administered 2022-11-17: 1000 mL via INTRAVENOUS

## 2022-11-17 MED ORDER — MECLIZINE HCL 25 MG PO TABS
25.0000 mg | ORAL_TABLET | Freq: Once | ORAL | Status: AC
  Administered 2022-11-17: 25 mg via ORAL
  Filled 2022-11-17: qty 1

## 2022-11-17 NOTE — ED Triage Notes (Signed)
Pt arrives to ED via [facility]. Pt very sleepy. Per EMS pt took some CBD last night at midnight and she has had some nausea and dizziness.

## 2022-11-17 NOTE — Discharge Instructions (Signed)
Take the meclizine for your dizziness.  Stay well-hydrated today.  Follow-up with your primary doctor in 3 days.

## 2022-11-17 NOTE — ED Notes (Signed)
Patient transported to CT 

## 2022-11-17 NOTE — ED Notes (Signed)
Attempted to ambulate pt. Pt very unsteady on feet. EDP aware

## 2022-11-17 NOTE — ED Provider Notes (Signed)
[place] [facility] Provider Note   CSN: [id number] Arrival date & time: 11/17/22  0734     History  Chief Complaint  Patient presents with   Dizziness    Kristy Cooper is a 57 y.o. female.  57 year old female with a history of diabetes and hyperlipidemia presents to the emergency department with nausea, jitteriness, and dizziness.  Reports that last night she took a CBD gummy to help her sleep.  Went to sleep at 11 PM.  Woke up in the night and felt jittery, nauseous, and dizzy.  Called 911 to come into the emergency department.  No pain.  No vomiting.  No recent illnesses otherwise.  No other drug use.       Home Medications Prior to Admission medications   Medication Sig Start Date End Date Taking? Authorizing Provider  meclizine (ANTIVERT) 25 MG tablet Take 1 tablet (25 mg total) by mouth 3 (three) times daily as needed for dizziness. 11/17/22  Yes Rondel Baton, MD  Aspirin-Acetaminophen-Caffeine (EXCEDRIN MIGRAINE PO) Take 3 tablets by mouth daily as needed (migraines.).     [provider]  atorvastatin (LIPITOR) 80 MG tablet Take 1 tablet (80 mg total) by mouth daily. 06/04/22   Kuneff, Renee A, DO  cephALEXin (KEFLEX) 500 MG capsule Take 1 capsule (500 mg total) by mouth 3 (three) times daily. 09/13/22   Kuneff, Renee A, DO  Continuous Blood Gluc Receiver (DEXCOM G7 RECEIVER) DEVI USE TO MONITOR BLOOD GLUCOSE 3 TIMES A DAY 07/04/22   Kuneff, Renee A, DO  Continuous Blood Gluc Transmit (DEXCOM G6 TRANSMITTER) MISC USE TO MONITOR BLOOD       GLUCOSE 3 TIMES A DAY 02/02/22   Kuneff, Renee A, DO  Continuous Glucose Sensor (DEXCOM G7 SENSOR) MISC USE TO MONITOR BLOOD GLUCOSE 3 TIMES A DAY 10/01/22   Kuneff, Renee A, DO  dapagliflozin propanediol (FARXIGA) 10 MG TABS tablet Take 1 tablet (10 mg total) by mouth daily before breakfast. 06/27/22   Kuneff, Renee A, DO  ezetimibe (ZETIA) 10 MG tablet Take 1 tablet (10 mg total) by mouth  daily. 06/04/22   Kuneff, Renee A, DO  gabapentin (NEURONTIN) 400 MG capsule Take 1 capsule (400 mg total) by mouth 3 (three) times daily. 06/27/22   Kuneff, Renee A, DO  metFORMIN (GLUCOPHAGE) 500 MG tablet Take 1 tablet (500 mg total) by mouth 2 (two) times daily with a meal. 06/27/22   Kuneff, Renee A, DO  sertraline (ZOLOFT) 100 MG tablet Take 1 tablet (100 mg total) by mouth daily. 06/27/22   Kuneff, Renee A, DO  tamsulosin (FLOMAX) 0.4 MG CAPS capsule Take 1 capsule (0.4 mg total) by mouth daily. 09/14/22   Kuneff, Renee A, DO      Allergies    Ibuprofen, Meloxicam, and Voltaren [diclofenac]    Review of Systems   Review of Systems  Physical Exam Updated Vital Signs BP 114/67   Pulse 88   Temp 98.7 F (37.1 C) (Oral)   Resp 15   Wt 83.9 kg   LMP 10/01/2018 (Approximate)   SpO2 97%   BMI 28.98 kg/m  Physical Exam Vitals and nursing note reviewed.  Constitutional:      General: She is not in acute distress.    Appearance: She is well-developed.     Comments: Sleepy resting in the stretcher  HENT:     Head: Normocephalic and atraumatic.     Right Ear: External ear normal.  Left Ear: External ear normal.     Nose: Nose normal.  Eyes:     Extraocular Movements: Extraocular movements intact.     Conjunctiva/sclera: Conjunctivae normal.     Pupils: Pupils are equal, round, and reactive to light.  Cardiovascular:     Rate and Rhythm: Normal rate and regular rhythm.  Pulmonary:     Effort: Pulmonary effort is normal. No respiratory distress.  Abdominal:     General: Abdomen is flat. There is no distension.     Palpations: Abdomen is soft. There is no mass.     Tenderness: There is no abdominal tenderness. There is no guarding.  Musculoskeletal:     Cervical back: Normal range of motion and neck supple.  Skin:    General: Skin is warm and dry.  Neurological:     Mental Status: She is alert and oriented to person, place, and time. Mental status is at baseline.   Psychiatric:        Mood and Affect: Mood normal.     ED Results / Procedures / Treatments   Labs (all labs ordered are listed, but only abnormal results are displayed) Labs Reviewed  BASIC METABOLIC PANEL - Abnormal; Notable for the following components:      Result Value   Glucose, Bld 223 (*)    BUN 23 (*)    All other components within normal limits  CBC - Abnormal; Notable for the following components:   RBC 5.14 (*)    All other components within normal limits  URINALYSIS, ROUTINE W REFLEX MICROSCOPIC - Abnormal; Notable for the following components:   Glucose, UA >=500 (*)    Hgb urine dipstick TRACE (*)    All other components within normal limits  RAPID URINE DRUG SCREEN, HOSP PERFORMED - Abnormal; Notable for the following components:   Tetrahydrocannabinol POSITIVE (*)    All other components within normal limits  URINALYSIS, MICROSCOPIC (REFLEX) - Abnormal; Notable for the following components:   Bacteria, UA RARE (*)    All other components within normal limits  CBG MONITORING, ED - Abnormal; Notable for the following components:   Glucose-Capillary 183 (*)    All other components within normal limits    EKG EKG Interpretation Date/Time:  Saturday November 17 2022 08:18:47 EDT Ventricular Rate:  85 PR Interval:  154 QRS Duration:  97 QT Interval:  376 QTC Calculation: 448 R Axis:   3  Text Interpretation: Sinus rhythm Low voltage, precordial leads Confirmed by Vonita Moss [phone]) on 11/17/2022 8:27:05 AM  Radiology CT Head Wo Contrast  Result Date: 11/17/2022 CLINICAL DATA:  Dizziness, drowsy. EXAM: CT HEAD WITHOUT CONTRAST TECHNIQUE: Contiguous axial images were obtained from the base of the skull through the vertex without intravenous contrast. RADIATION DOSE REDUCTION: This exam was performed according to the departmental dose-optimization program which includes automated exposure control, adjustment of the mA and/or kV according to patient size  and/or use of iterative reconstruction technique. COMPARISON:  MR brain dated 03/06/2016. FINDINGS: Brain: No evidence of acute infarction, hemorrhage, hydrocephalus, extra-axial collection or mass lesion/mass effect. Vascular: No hyperdense vessel or unexpected calcification. Skull: Normal. Negative for fracture or focal lesion. Sinuses/Orbits: No acute finding. Other: None. IMPRESSION: No acute intracranial process. Electronically Signed   By: Romona Curls M.D.   On: 11/17/2022 11:04    Procedures Procedures    Medications Ordered in ED Medications  lactated ringers bolus 1,000 mL ( Intravenous Stopped 11/17/22 0931)  ondansetron (ZOFRAN) injection 4 mg (4 mg  Intravenous Given 11/17/22 0824)  meclizine (ANTIVERT) tablet 25 mg (25 mg Oral Given 11/17/22 1217)    ED Course/ Medical Decision Making/ A&P Clinical Course as of 11/17/22 1803  Sat Nov 17, 2022  1216 Husband is at the bedside.  Reports that he also had a gummy and was feeling very dizzy and having identical symptoms to her last night.  Reports that since hers persisted they brought her into the emergency department.  Says that this was a new gummy that they tried from a friend. [RP]    Clinical Course User Index [RP] Rondel Baton, MD                                 Medical Decision Making Amount and/or Complexity of Data Reviewed Labs: ordered. Radiology: ordered.  Risk Prescription drug management.   Kristy Cooper is a 57 y.o. female with comorbidities that complicate the patient evaluation including diabetes and hyperlipidemia who presents to the emergency department with nausea, jitteriness, and dizziness  Initial Ddx:  Marijuana side effect, dehydration, stroke, ICH  MDM/Course:  Patient presents to the emergency department with nausea, jitteriness, and dizziness in the setting of eating of edible.  On exam has no cerebellar symptoms but is unable to walk because of tremor and dizziness that is only  present upon standing and attempting to ambulate.  Was given IV fluids and had lab work drawn that was unremarkable.  Her significant other came to the emergency department after she had a head CT that was unremarkable and stated that he was having the exact same symptoms after eating this edible.  Upon re-evaluation patient was much improved after several hours and was able to ambulate again.  With his collateral history from her husband feel that her symptoms are likely due to the edible especially seeing how much she is improved in the emergency department.  Will have her follow-up with her primary doctor in several days and counseled them not to take this edible again.  This patient presents to the ED for concern of complaints listed in HPI, this involves an extensive number of treatment options, and is a complaint that carries with it a high risk of complications and morbidity. Disposition including potential need for admission considered.   Dispo: DC Home. Return precautions discussed including, but not limited to, those listed in the AVS. Allowed pt time to ask questions which were answered fully prior to dc.  Additional history obtained from spouse Records reviewed Outpatient Clinic Notes The following labs were independently interpreted: Chemistry and show no acute abnormality I independently reviewed the following imaging with scope of interpretation limited to determining acute life threatening conditions related to emergency care: CT Head and agree with the radiologist interpretation with the following exceptions: none I personally reviewed and interpreted cardiac monitoring: normal sinus rhythm  I personally reviewed and interpreted the pt's EKG: see above for interpretation  I have reviewed the patients home medications and made adjustments as needed Social Determinants of health:  Marijuana use         Final Clinical Impression(s) / ED Diagnoses Final diagnoses:  Dizziness   Marijuana use    Rx / DC Orders ED Discharge Orders          Ordered    meclizine (ANTIVERT) 25 MG tablet  3 times daily PRN        11/17/22 1320  Rondel Baton, MD 11/17/22 [phone]

## 2022-11-24 ENCOUNTER — Other Ambulatory Visit: Payer: Self-pay | Admitting: Family Medicine

## 2023-01-15 ENCOUNTER — Other Ambulatory Visit: Payer: Self-pay | Admitting: Family Medicine

## 2023-01-23 ENCOUNTER — Encounter: Payer: Self-pay | Admitting: Family Medicine

## 2023-01-23 ENCOUNTER — Ambulatory Visit: Payer: BC Managed Care – PPO | Admitting: Family Medicine

## 2023-01-23 VITALS — BP 120/80 | HR 85 | Temp 97.8°F | Wt 187.6 lb

## 2023-01-23 DIAGNOSIS — M5136 Other intervertebral disc degeneration, lumbar region with discogenic back pain only: Secondary | ICD-10-CM | POA: Diagnosis not present

## 2023-01-23 DIAGNOSIS — Z79899 Other long term (current) drug therapy: Secondary | ICD-10-CM | POA: Diagnosis not present

## 2023-01-23 DIAGNOSIS — E785 Hyperlipidemia, unspecified: Secondary | ICD-10-CM

## 2023-01-23 DIAGNOSIS — E1169 Type 2 diabetes mellitus with other specified complication: Secondary | ICD-10-CM

## 2023-01-23 DIAGNOSIS — F4323 Adjustment disorder with mixed anxiety and depressed mood: Secondary | ICD-10-CM | POA: Diagnosis not present

## 2023-01-23 DIAGNOSIS — Z7984 Long term (current) use of oral hypoglycemic drugs: Secondary | ICD-10-CM

## 2023-01-23 LAB — POCT GLYCOSYLATED HEMOGLOBIN (HGB A1C)
HbA1c POC (<> result, manual entry): 7.3 % (ref 4.0–5.6)
HbA1c, POC (controlled diabetic range): 7.3 % — AB (ref 0.0–7.0)
HbA1c, POC (prediabetic range): 7.3 % — AB (ref 5.7–6.4)
Hemoglobin A1C: 7.3 % — AB (ref 4.0–5.6)

## 2023-01-23 MED ORDER — DAPAGLIFLOZIN PROPANEDIOL 10 MG PO TABS: 10.0000 mg | ORAL_TABLET | Freq: Every day | ORAL | 1 refills | Status: DC

## 2023-01-23 MED ORDER — SERTRALINE HCL 100 MG PO TABS: 100.0000 mg | ORAL_TABLET | Freq: Every day | ORAL | 1 refills | Status: DC

## 2023-01-23 MED ORDER — METFORMIN HCL 500 MG PO TABS: 500.0000 mg | ORAL_TABLET | Freq: Two times a day (BID) | ORAL | 1 refills | Status: DC

## 2023-01-23 MED ORDER — ATORVASTATIN CALCIUM 80 MG PO TABS: 80.0000 mg | ORAL_TABLET | Freq: Every day | ORAL | 2 refills | Status: DC

## 2023-01-23 MED ORDER — GABAPENTIN 400 MG PO CAPS: 400.0000 mg | ORAL_CAPSULE | Freq: Three times a day (TID) | ORAL | 1 refills | Status: DC

## 2023-01-23 NOTE — Patient Instructions (Addendum)
Return in about 24 weeks (around 07/10/2023) for cpe (20 min), Routine chronic condition follow-up.        Great to see you today.  I have refilled the medication(s) we provide.   If labs were collected or images ordered, we will inform you of  results once we have received them and reviewed. We will contact you either by echart message, or telephone call.  Please give ample time to the testing facility, and our office to run,  receive and review results. Please do not call inquiring of results, even if you can see them in your chart. We will contact you as soon as we are able. If it has been over 1 week since the test was completed, and you have not yet heard from [place], then please call us.    - echart message- for normal results that have been seen by the patient already.   - telephone call: abnormal results or if patient has not viewed results in their echart.  If a referral to a specialist was entered for you, please call us in 2 weeks if you have not heard from the specialist office to schedule.

## 2023-01-23 NOTE — Progress Notes (Addendum)
Patient Care Team    Relationship Specialty Notifications Start End  Natalia Leatherwood, DO PCP - General Family Medicine  11/02/15   Ranae Pila, MD Consulting Physician Obstetrics and Gynecology  12/28/21      SUBJECTIVE Chief Complaint  Patient presents with   Diabetes    DM eye- [facility] completed    HPI: Kristy Cooper is a  57 y.o. female presents today for chronic medical conditions management Adjustment disorder with mixed anxiety and depressed mood She reports compliance  with Zoloft 100 mg daily and feels it is working well for her at this dose.   diabetes/obesity Pt reports compliance with metformin 500 BID and farxiga 10 mg qd Tried: Ozempic-patient tolerated, insurance declined.  Victoza-too expensive, Marcelline Deist too expensive Patient denies dizziness, hyperglycemic or hypoglycemic events. Patient denies numbness, tingling in the extremities or nonhealing wounds of feet.   Hyperlipidemia, unspecified hyperlipidemia type/ Hypertriglyceridemia Patient has a history of hyperlipidemia and hypertriglyceridemia.fhx HD.    She reports compliance with lipitor 80 mg qd.   works great for her lumbar pain.  Low back pain/ DDD (degenerative disc disease),/ Lumbar radiculopathy: Patient reports compliance with gabapentin 400 mg 3 times a day.  She feels it is working very well  for her lower back radiculopathy. Prior note This is a new problem for patient. She states she has had right-sided low back pain for about one month. There was no increase in activity, lifting or known injury acutely. She states she did have an injury when she was a young child, which did not need any medical intervention, but nothing recent. However over the last month she started with right sided lower back pain, that radiated into her buttock and now is radiating down to her right foot. She reports tingling sensation intermittently in her right foot. She denies any weakness. She denies  any bladder or bowel changes.      03/14/2022    3:43 PM 08/17/2021   10:52 AM 04/19/2021    1:09 PM 02/01/2020    3:44 PM 08/10/2019    1:00 PM  Depression screen PHQ 2/9  Decreased Interest 0 0 0 0 0  Down, Depressed, Hopeless 0 1 0 0 0  PHQ - 2 Score 0 1 0 0 0  Altered sleeping 1 2  1 3   Tired, decreased energy 0 1  0 0  Change in appetite 0 2  0 0  Feeling bad or failure about yourself  0 0  0 0  Trouble concentrating 0 0  0 0  Moving slowly or fidgety/restless 0 0  0 0  Suicidal thoughts 0 0  0 0  PHQ-9 Score 1 6  1 3   Difficult doing work/chores     Not difficult at all      03/14/2022    3:43 PM 08/17/2021   10:52 AM 02/01/2020    3:44 PM 08/10/2019    1:00 PM  GAD 7 : Generalized Anxiety Score  Nervous, Anxious, on Edge 0 0 0 0  Control/stop worrying 0 2 1 3   Worry too much - different things 0 2 1 3   Trouble relaxing 0 1 0 0  Restless 0 1 0 0  Easily annoyed or irritable 0 0 0 0  Afraid - awful might happen 0 0 0 0  Total GAD 7 Score 0 6 2 6   Anxiety Difficulty    Not difficult at all    ROS: See pertinent positives and negatives  per HPI.  Patient Active Problem List   Diagnosis Date Noted   Not immune to measles 05/11/2022   Type 2 diabetes mellitus with hyperlipidemia (HCC)-LDL goal <100 11/23/2020   BMI 30.0-30.9,adult 11/23/2020   On statin therapy 11/23/2020   DDD (degenerative disc disease), lumbar 09/03/2016   Lumbar radiculopathy 09/03/2016   Adjustment disorder with mixed anxiety and depressed mood 07/30/2016   History of colon polyps 11/02/2015   Morbid obesity (HCC) 11/02/2015    Social History   Tobacco Use   Smoking status: Former   Smokeless tobacco: Never  Substance Use Topics   Alcohol use: Yes    Comment: occ    Current Outpatient Medications:    Aspirin-Acetaminophen-Caffeine (EXCEDRIN MIGRAINE PO), Take 3 tablets by mouth daily as needed (migraines.). , Disp: , Rfl:    Continuous Blood Gluc Receiver (DEXCOM G7 RECEIVER) DEVI, USE  TO MONITOR BLOOD GLUCOSE 3 TIMES A DAY, Disp: 3 each, Rfl: 0   Continuous Glucose Sensor (DEXCOM G7 SENSOR) MISC, USE TO MONITOR BLOOD GLUCOSE 3 TIMES A DAY, Disp: 3 each, Rfl: 1   ezetimibe (ZETIA) 10 MG tablet, Take 1 tablet (10 mg total) by mouth daily., Disp: 90 tablet, Rfl: 2   meclizine (ANTIVERT) 25 MG tablet, Take 1 tablet (25 mg total) by mouth 3 (three) times daily as needed for dizziness., Disp: 15 tablet, Rfl: 0   atorvastatin (LIPITOR) 80 MG tablet, Take 1 tablet (80 mg total) by mouth daily., Disp: 90 tablet, Rfl: 2   dapagliflozin propanediol (FARXIGA) 10 MG TABS tablet, Take 1 tablet (10 mg total) by mouth daily before breakfast., Disp: 90 tablet, Rfl: 1   gabapentin (NEURONTIN) 400 MG capsule, Take 1 capsule (400 mg total) by mouth 3 (three) times daily., Disp: 270 capsule, Rfl: 1   metFORMIN (GLUCOPHAGE) 500 MG tablet, Take 1 tablet (500 mg total) by mouth 2 (two) times daily with a meal., Disp: 180 tablet, Rfl: 1   sertraline (ZOLOFT) 100 MG tablet, Take 1 tablet (100 mg total) by mouth daily., Disp: 90 tablet, Rfl: 1  Allergies  Allergen Reactions   Ibuprofen Swelling    Swelling of eyes and mouth   Meloxicam Swelling   Voltaren [Diclofenac] Swelling    OBJECTIVE: BP 120/80   Pulse 85   Temp 97.8 F (36.6 C)   Wt 187 lb 9.6 oz (85.1 kg)   LMP 10/01/2018 (Approximate)   SpO2 98%   BMI 29.38 kg/m  Physical Exam Vitals and nursing note reviewed.  Constitutional:      General: She is not in acute distress.    Appearance: Normal appearance. She is obese. She is not ill-appearing, toxic-appearing or diaphoretic.  HENT:     Head: Normocephalic and atraumatic.  Eyes:     General: No scleral icterus.       Right eye: No discharge.        Left eye: No discharge.     Extraocular Movements: Extraocular movements intact.     Conjunctiva/sclera: Conjunctivae normal.     Pupils: Pupils are equal, round, and reactive to light.  Cardiovascular:     Rate and Rhythm:  Normal rate and regular rhythm.     Heart sounds: No murmur heard. Pulmonary:     Effort: Pulmonary effort is normal. No respiratory distress.     Breath sounds: Normal breath sounds. No wheezing, rhonchi or rales.  Musculoskeletal:     Right lower leg: No edema.     Left lower leg: No edema.  Skin:    Findings: No rash.  Neurological:     Mental Status: She is alert and oriented to person, place, and time. Mental status is at baseline.  Psychiatric:        Mood and Affect: Mood normal.        Behavior: Behavior normal.        Thought Content: Thought content normal.        Judgment: Judgment normal.    Results for orders placed or performed in visit on 01/23/23 (from the past 48 hour(s))  POCT glycosylated hemoglobin (Hb A1C)     Status: Abnormal   Collection Time: 01/23/23  9:41 AM  Result Value Ref Range   Hemoglobin A1C 7.3 (A) 4.0 - 5.6 %   HbA1c POC (<> result, manual entry) 7.3 4.0 - 5.6 %   HbA1c, POC (prediabetic range) 7.3 (A) 5.7 - 6.4 %   HbA1c, POC (controlled diabetic range) 7.3 (A) 0.0 - 7.0 %     ASSESSMENT AND PLAN: Kristy Cooper is a 57 y.o. female present for  Adjustment disorder with mixed anxiety and depressed mood Stable Continue Zoloft 100 mg daily.  We discussed maybe trying a different medication in the future with less weight gain profile. - Follow-up in 4 months, sooner if needed.   Lumbar radiculopathy Stable Continue gabapentin 400 mg 3 times daily.  - f/u 4ms unless needed sooner.   diabetes with hyperlipidemia. Stable Continue metformin 500 mg twice daily> she is already having GI SE and will not tolerate a higher dose.  Continue Farxiga 10 mg daily Tried: Ozempic (too expensive), Victoza (too expensive), Farxiga too expensive Discussed diabetic diet. Increase exercise. Ophthalmology up-to-date 2024 Foot exam completed  01/23/2023 Microalbumin UTD 09/2022 Influenza vaccine UTD 2024 Prevnar 20 UTD 2022 Last a1c 6.3> 6.6 > 6.6>  7.6> 7.0>6.5 > 6.6 > 6.9>7.2 >7.3 a1C today  Hyperlipidemia, unspecified hyperlipidemia type/ Hypertriglyceridemia/ on statin therapy.  -Fhx HD present in mother.  - diet and exercise.  Continue atorvastatin 80 mg daily.  Labs up-to-date   Orders Placed This Encounter  Procedures   POCT glycosylated hemoglobin (Hb A1C)   Meds ordered this encounter  Medications   atorvastatin (LIPITOR) 80 MG tablet    Sig: Take 1 tablet (80 mg total) by mouth daily.    Dispense:  90 tablet    Refill:  2   dapagliflozin propanediol (FARXIGA) 10 MG TABS tablet    Sig: Take 1 tablet (10 mg total) by mouth daily before breakfast.    Dispense:  90 tablet    Refill:  1   gabapentin (NEURONTIN) 400 MG capsule    Sig: Take 1 capsule (400 mg total) by mouth 3 (three) times daily.    Dispense:  270 capsule    Refill:  1   metFORMIN (GLUCOPHAGE) 500 MG tablet    Sig: Take 1 tablet (500 mg total) by mouth 2 (two) times daily with a meal.    Dispense:  180 tablet    Refill:  1   sertraline (ZOLOFT) 100 MG tablet    Sig: Take 1 tablet (100 mg total) by mouth daily.    Dispense:  90 tablet    Refill:  1    Referral Orders  No referral(s) requested today    Felix Pacini, DO 01/23/2023

## 2023-02-08 ENCOUNTER — Ambulatory Visit: Payer: BC Managed Care – PPO | Admitting: Family Medicine

## 2023-02-08 ENCOUNTER — Encounter: Payer: Self-pay | Admitting: Family Medicine

## 2023-02-08 VITALS — BP 122/70 | HR 80 | Temp 97.9°F | Wt 185.2 lb

## 2023-02-08 DIAGNOSIS — L989 Disorder of the skin and subcutaneous tissue, unspecified: Secondary | ICD-10-CM | POA: Diagnosis not present

## 2023-02-08 NOTE — Patient Instructions (Signed)

## 2023-02-08 NOTE — Progress Notes (Signed)
Kristy Cooper , 1965/10/30, 57 y.o., female MRN: [id number] Patient Care Team    Relationship Specialty Notifications Start End  Natalia Leatherwood, DO PCP - General Family Medicine  11/02/15   Ranae Pila, MD Consulting Physician Obstetrics and Gynecology  12/28/21     Chief Complaint  Patient presents with   Skin Tag    Right side temple; notice a few weeks ago; notice change in size last week     Subjective: Kristy Cooper is a 57 y.o. Pt presents for an OV with complaints of new skin lesion  of 2 weeks duration.  Associated symptoms include growth over the last week. Denies redness, pain, drainage. No personal or fhx of skin cancer.      02/08/2023   10:42 AM 03/14/2022    3:43 PM 08/17/2021   10:52 AM 04/19/2021    1:09 PM 02/01/2020    3:44 PM  Depression screen PHQ 2/9  Decreased Interest 2 0 0 0 0  Down, Depressed, Hopeless 1 0 1 0 0  PHQ - 2 Score 3 0 1 0 0  Altered sleeping 1 1 2  1   Tired, decreased energy 0 0 1  0  Change in appetite 0 0 2  0  Feeling bad or failure about yourself  0 0 0  0  Trouble concentrating 0 0 0  0  Moving slowly or fidgety/restless 0 0 0  0  Suicidal thoughts 0 0 0  0  PHQ-9 Score 4 1 6  1   Difficult doing work/chores Not difficult at all        Allergies  Allergen Reactions   Ibuprofen Swelling    Swelling of eyes and mouth   Meloxicam Swelling   Voltaren [Diclofenac] Swelling   Social History   Social History Narrative   Married to [place]. 2 chilrn Jaclynn Guarneri)- special needs and an adult child. She also has a grandchild.    HS grad. Artist.   Drinks caffeine. Takes daily vitamin.   Wears seatbelt. Wears bicycle helmet. Smoke detector in the home.    Exercises routinely.    Feels safe in relationships.           Past Medical History:  Diagnosis Date   Arthralgia of left temporomandibular joint 12/24/2018   Bilateral foot pain    chronic- told arthritis and was prescribed tramadol.    Colon polyp 2014    tubular adenoma   Depression    Depression with anxiety    on meds   Diabetes mellitus without complication (HCC)    on meds   GERD (gastroesophageal reflux disease)    hx of-diet controlled   Hyperlipidemia LDL goal <130    on meds   Left breast mass    Obesity    Prediabetes    Right rotator cuff tendinitis 2013   Sciatic leg pain 2019   right leg   Past Surgical History:  Procedure Laterality Date   BREAST LUMPECTOMY WITH RADIOACTIVE SEED LOCALIZATION Left 11/25/2019   Procedure: RADIOACTIVE SEED GUIDED LEFT BREAST LUMPECTOMY;  Surgeon: Abigail Miyamoto, MD;  Location: [facility];  Service: General;  Laterality: Left;  LMA   CESAREAN SECTION     COLONOSCOPY  2016   [facility]-prep exc-TA   POLYPECTOMY  2016   TA   Family History  Problem Relation Age of Onset   Colon polyps Mother 61   Breast cancer Mother    Diabetes Mother  Mental illness Mother    Heart disease Mother    Arthritis Mother    Arthritis Father    Mental illness Father    Colon cancer Maternal Aunt 72   Colon polyps Maternal Aunt 54   Breast cancer Maternal Aunt    Diabetes Maternal Aunt    Arthritis Maternal Aunt    Colon polyps Maternal Uncle    Colon cancer Maternal Uncle    Diabetes Maternal Uncle    Colon polyps Paternal Aunt    Colon cancer Paternal Aunt    Diabetes Paternal Aunt    Heart disease Paternal Aunt    Esophageal cancer Neg Hx    Stomach cancer Neg Hx    Rectal cancer Neg Hx    Thyroid cancer Neg Hx    Allergies as of 02/08/2023       Reactions   Ibuprofen Swelling   Swelling of eyes and mouth   Meloxicam Swelling   Voltaren [diclofenac] Swelling        Medication List        Accurate as of February 08, 2023 10:49 AM. If you have any questions, ask your nurse or doctor.          atorvastatin 80 MG tablet Commonly known as: LIPITOR Take 1 tablet (80 mg total) by mouth daily.   dapagliflozin propanediol 10 MG Tabs tablet Commonly  known as: Farxiga Take 1 tablet (10 mg total) by mouth daily before breakfast.   Dexcom G7 Receiver Devi USE TO MONITOR BLOOD GLUCOSE 3 TIMES A DAY   Dexcom G7 Sensor Misc USE TO MONITOR BLOOD GLUCOSE 3 TIMES A DAY   EXCEDRIN MIGRAINE PO Take 3 tablets by mouth daily as needed (migraines.).   ezetimibe 10 MG tablet Commonly known as: Zetia Take 1 tablet (10 mg total) by mouth daily.   gabapentin 400 MG capsule Commonly known as: NEURONTIN Take 1 capsule (400 mg total) by mouth 3 (three) times daily.   meclizine 25 MG tablet Commonly known as: ANTIVERT Take 1 tablet (25 mg total) by mouth 3 (three) times daily as needed for dizziness.   metFORMIN 500 MG tablet Commonly known as: GLUCOPHAGE Take 1 tablet (500 mg total) by mouth 2 (two) times daily with a meal.   sertraline 100 MG tablet Commonly known as: ZOLOFT Take 1 tablet (100 mg total) by mouth daily.        All past medical history, surgical history, allergies, family history, immunizations andmedications were updated in the EMR today and reviewed under the history and medication portions of their EMR.     ROS Negative, with the exception of above mentioned in HPI   Objective:  BP 122/70   Pulse 80   Temp 97.9 F (36.6 C)   Wt 185 lb 3.2 oz (84 kg)   LMP 10/01/2018 (Approximate)   SpO2 98%   BMI 29.01 kg/m  Body mass index is 29.01 kg/m. Physical Exam Vitals and nursing note reviewed.  Constitutional:      General: She is not in acute distress.    Appearance: Normal appearance. She is normal weight. She is not ill-appearing or toxic-appearing.  HENT:     Head: Normocephalic and atraumatic.  Eyes:     General: No scleral icterus.       Right eye: No discharge.        Left eye: No discharge.     Extraocular Movements: Extraocular movements intact.     Conjunctiva/sclera: Conjunctivae normal.  Pupils: Pupils are equal, round, and reactive to light.  Skin:    Findings: Lesion (77mmx8mm lightly  pigmented raised round lesion of right temple area. at hairline) present. No rash.  Neurological:     Mental Status: She is alert and oriented to person, place, and time. Mental status is at baseline.     Motor: No weakness.     Coordination: Coordination normal.     Gait: Gait normal.  Psychiatric:        Mood and Affect: Mood normal.        Behavior: Behavior normal.        Thought Content: Thought content normal.        Judgment: Judgment normal.     No results found. No results found. No results found for this or any previous visit (from the past 24 hour(s)).  Assessment/Plan: Kristy Cooper is a 57 y.o. female present for OV for  Skin lesion of face - pt noticed a new skin lesion on her right temple 2 weeks ago and feels it has already started to become larger.  Will refer to dermatology for eval and removal/bx - Ambulatory referral to Dermatology  Reviewed expectations re: course of current medical issues. Discussed self-management of symptoms. Outlined signs and symptoms indicating need for more acute intervention. Patient verbalized understanding and all questions were answered. Patient received an After-Visit Summary.    Orders Placed This Encounter  Procedures   Ambulatory referral to Dermatology   No orders of the defined types were placed in this encounter.  Referral Orders         Ambulatory referral to Dermatology       Note is dictated utilizing voice recognition software. Although note has been proof read prior to signing, occasional typographical errors still can be missed. If any questions arise, please do not hesitate to call for verification.   electronically signed by:  Felix Pacini, DO  [facility] - [place]

## 2023-03-08 ENCOUNTER — Encounter: Payer: Self-pay | Admitting: Family Medicine

## 2023-03-08 NOTE — Telephone Encounter (Signed)
 Care team updated and letter sent for eye exam notes.

## 2023-03-10 ENCOUNTER — Other Ambulatory Visit: Payer: Self-pay | Admitting: Family Medicine

## 2023-03-19 ENCOUNTER — Other Ambulatory Visit: Payer: Self-pay

## 2023-03-19 MED ORDER — EZETIMIBE 10 MG PO TABS: 10.0000 mg | ORAL_TABLET | Freq: Every day | ORAL | 0 refills | Status: DC

## 2023-04-17 DIAGNOSIS — Z124 Encounter for screening for malignant neoplasm of cervix: Secondary | ICD-10-CM | POA: Diagnosis not present

## 2023-04-17 DIAGNOSIS — Z01419 Encounter for gynecological examination (general) (routine) without abnormal findings: Secondary | ICD-10-CM | POA: Diagnosis not present

## 2023-04-17 DIAGNOSIS — Z6829 Body mass index (BMI) 29.0-29.9, adult: Secondary | ICD-10-CM | POA: Diagnosis not present

## 2023-04-17 DIAGNOSIS — Z1231 Encounter for screening mammogram for malignant neoplasm of breast: Secondary | ICD-10-CM | POA: Diagnosis not present

## 2023-04-17 DIAGNOSIS — Z1151 Encounter for screening for human papillomavirus (HPV): Secondary | ICD-10-CM | POA: Diagnosis not present

## 2023-04-18 ENCOUNTER — Other Ambulatory Visit: Payer: Self-pay | Admitting: Obstetrics and Gynecology

## 2023-04-18 DIAGNOSIS — N6092 Unspecified benign mammary dysplasia of left breast: Secondary | ICD-10-CM

## 2023-04-22 ENCOUNTER — Other Ambulatory Visit: Payer: Self-pay | Admitting: Family Medicine

## 2023-04-23 ENCOUNTER — Encounter: Payer: Self-pay | Admitting: Obstetrics and Gynecology

## 2023-05-04 LAB — HM DIABETES EYE EXAM

## 2023-05-18 ENCOUNTER — Other Ambulatory Visit: Payer: Self-pay | Admitting: Family Medicine

## 2023-06-02 ENCOUNTER — Other Ambulatory Visit: Payer: Self-pay | Admitting: Family Medicine

## 2023-06-05 ENCOUNTER — Ambulatory Visit
Admission: RE | Admit: 2023-06-05 | Discharge: 2023-06-05 | Disposition: A | Discharge status: Home / Self Care | Payer: BC Managed Care – PPO | Source: Ambulatory Visit | Attending: Obstetrics and Gynecology

## 2023-06-05 DIAGNOSIS — Z803 Family history of malignant neoplasm of breast: Secondary | ICD-10-CM | POA: Diagnosis not present

## 2023-06-05 DIAGNOSIS — N6092 Unspecified benign mammary dysplasia of left breast: Secondary | ICD-10-CM

## 2023-06-05 MED ORDER — GADOPICLENOL 0.5 MMOL/ML IV SOLN
9.0000 mL | Freq: Once | INTRAVENOUS | Status: AC | PRN
  Administered 2023-06-05: 9 mL via INTRAVENOUS

## 2023-06-05 MED ORDER — GADOPICLENOL 0.5 MMOL/ML IV SOLN: 9.0000 mL | Freq: Once | INTRAVENOUS | Status: DC | PRN

## 2023-07-24 ENCOUNTER — Ambulatory Visit: Admitting: Family Medicine

## 2023-07-24 ENCOUNTER — Encounter: Payer: Self-pay | Admitting: Family Medicine

## 2023-07-24 VITALS — BP 120/74 | HR 81 | Temp 98.2°F | Wt 187.8 lb

## 2023-07-24 DIAGNOSIS — E785 Hyperlipidemia, unspecified: Secondary | ICD-10-CM | POA: Diagnosis not present

## 2023-07-24 DIAGNOSIS — E1169 Type 2 diabetes mellitus with other specified complication: Secondary | ICD-10-CM

## 2023-07-24 DIAGNOSIS — M5136 Other intervertebral disc degeneration, lumbar region with discogenic back pain only: Secondary | ICD-10-CM

## 2023-07-24 DIAGNOSIS — Z79899 Other long term (current) drug therapy: Secondary | ICD-10-CM | POA: Diagnosis not present

## 2023-07-24 DIAGNOSIS — Z7984 Long term (current) use of oral hypoglycemic drugs: Secondary | ICD-10-CM

## 2023-07-24 DIAGNOSIS — R350 Frequency of micturition: Secondary | ICD-10-CM

## 2023-07-24 DIAGNOSIS — Z6830 Body mass index (BMI) 30.0-30.9, adult: Secondary | ICD-10-CM

## 2023-07-24 DIAGNOSIS — M5416 Radiculopathy, lumbar region: Secondary | ICD-10-CM

## 2023-07-24 DIAGNOSIS — F4323 Adjustment disorder with mixed anxiety and depressed mood: Secondary | ICD-10-CM

## 2023-07-24 LAB — MICROALBUMIN / CREATININE URINE RATIO
Creatinine,U: 68.9 mg/dL
Microalb Creat Ratio: 28.7 mg/g (ref 0.0–30.0)
Microalb, Ur: 2 mg/dL — ABNORMAL HIGH (ref 0.0–1.9)

## 2023-07-24 LAB — HEMOGLOBIN A1C: Hgb A1c MFr Bld: 8.6 % — ABNORMAL HIGH (ref 4.6–6.5)

## 2023-07-24 MED ORDER — DAPAGLIFLOZIN PROPANEDIOL 10 MG PO TABS: 10.0000 mg | ORAL_TABLET | Freq: Every day | ORAL | 1 refills | Status: DC

## 2023-07-24 MED ORDER — GABAPENTIN 400 MG PO CAPS: 400.0000 mg | ORAL_CAPSULE | Freq: Three times a day (TID) | ORAL | 1 refills | Status: DC

## 2023-07-24 MED ORDER — SERTRALINE HCL 100 MG PO TABS: 100.0000 mg | ORAL_TABLET | Freq: Every day | ORAL | 1 refills | Status: DC

## 2023-07-24 MED ORDER — EZETIMIBE 10 MG PO TABS: 10.0000 mg | ORAL_TABLET | Freq: Every day | ORAL | 3 refills | Status: AC

## 2023-07-24 MED ORDER — ATORVASTATIN CALCIUM 80 MG PO TABS: 80.0000 mg | ORAL_TABLET | Freq: Every day | ORAL | 2 refills | Status: DC

## 2023-07-24 MED ORDER — METFORMIN HCL 500 MG PO TABS: 500.0000 mg | ORAL_TABLET | Freq: Two times a day (BID) | ORAL | 1 refills | Status: DC

## 2023-07-24 NOTE — Progress Notes (Signed)
 Patient Care Team    Relationship Specialty Notifications Start End  Mariel Shope, DO PCP - General Family Medicine  11/02/15   Concepcion Deck, MD Consulting Physician Obstetrics and Gynecology  12/28/21   [facility]  Optometry  04/02/22      SUBJECTIVE Chief Complaint  Patient presents with   Diabetes    HPI: Kristy Cooper is a  58 y.o. female presents today for chronic medical conditions management Adjustment disorder with mixed anxiety and depressed mood She reports compliance with Zoloft  100 mg daily and feels it is working well for her at this dose.   diabetes/obesity Pt reports compliance with metformin  500 BID and farxiga  10 mg qd Tried: Ozempic -patient tolerated, insurance declined.  Victoza -too expensive, Farxiga  too expensive Patient denies dizziness, hyperglycemic or hypoglycemic events. Patient denies numbness, tingling in the extremities or nonhealing wounds of feet.   Hyperlipidemia, unspecified hyperlipidemia type/ Hypertriglyceridemia Patient has a history of hyperlipidemia and hypertriglyceridemia.fhx HD.    She reports compliance with lipitor 80 mg qd.     Low back pain/ DDD (degenerative disc disease),/ Lumbar radiculopathy: Patient reports compliance with gabapentin  400 mg 3 times a day.  She feels it is working very well  for her lower back radiculopathy. Prior note This is a new problem for patient. She states she has had right-sided low back pain for about one month. There was no increase in activity, lifting or known injury acutely. She states she did have an injury when she was a young child, which did not need any medical intervention, but nothing recent. However over the last month she started with right sided lower back pain, that radiated into her buttock and now is radiating down to her right foot. She reports tingling sensation intermittently in her right foot. She denies any weakness. She denies any bladder or  bowel changes.      02/08/2023   10:42 AM 03/14/2022    3:43 PM 08/17/2021   10:52 AM 04/19/2021    1:09 PM 02/01/2020    3:44 PM  Depression screen PHQ 2/9  Decreased Interest 2 0 0 0 0  Down, Depressed, Hopeless 1 0 1 0 0  PHQ - 2 Score 3 0 1 0 0  Altered sleeping 1 1 2  1   Tired, decreased energy 0 0 1  0  Change in appetite 0 0 2  0  Feeling bad or failure about yourself  0 0 0  0  Trouble concentrating 0 0 0  0  Moving slowly or fidgety/restless 0 0 0  0  Suicidal thoughts 0 0 0  0  PHQ-9 Score 4 1 6  1   Difficult doing work/chores Not difficult at all          02/08/2023   10:43 AM 03/14/2022    3:43 PM 08/17/2021   10:52 AM 02/01/2020    3:44 PM  GAD 7 : Generalized Anxiety Score  Nervous, Anxious, on Edge 1 0 0 0  Control/stop worrying 1 0 2 1  Worry too much - different things 0 0 2 1  Trouble relaxing 0 0 1 0  Restless 0 0 1 0  Easily annoyed or irritable 0 0 0 0  Afraid - awful might happen 0 0 0 0  Total GAD 7 Score 2 0 6 2  Anxiety Difficulty Somewhat difficult       ROS: See pertinent positives and negatives per HPI.  Patient Active Problem List  Diagnosis Date Noted   Not immune to measles 05/11/2022   Type 2 diabetes mellitus with hyperlipidemia (HCC)-LDL goal <100 11/23/2020   BMI 30.0-30.9,adult 11/23/2020   On statin therapy 11/23/2020   DDD (degenerative disc disease), lumbar 09/03/2016   Lumbar radiculopathy 09/03/2016   Adjustment disorder with mixed anxiety and depressed mood 07/30/2016   History of colon polyps 11/02/2015   Morbid obesity (HCC) 11/02/2015    Social History   Tobacco Use   Smoking status: Former   Smokeless tobacco: Never  Substance Use Topics   Alcohol use: Yes    Comment: occ    Current Outpatient Medications:    Aspirin-Acetaminophen -Caffeine (EXCEDRIN MIGRAINE PO), Take 3 tablets by mouth daily as needed (migraines.). , Disp: , Rfl:    Continuous Blood Gluc Receiver (DEXCOM G7 RECEIVER) DEVI, USE TO MONITOR BLOOD  GLUCOSE 3 TIMES A DAY, Disp: 3 each, Rfl: 0   Continuous Glucose Sensor (DEXCOM G7 SENSOR) MISC, USE TO MONITOR BLOOD GLUCOSE 3 TIMES A DAY, Disp: 3 each, Rfl: 1   meclizine  (ANTIVERT ) 25 MG tablet, Take 1 tablet (25 mg total) by mouth 3 (three) times daily as needed for dizziness., Disp: 15 tablet, Rfl: 0   atorvastatin  (LIPITOR) 80 MG tablet, Take 1 tablet (80 mg total) by mouth daily., Disp: 90 tablet, Rfl: 2   dapagliflozin  propanediol (FARXIGA ) 10 MG TABS tablet, Take 1 tablet (10 mg total) by mouth daily before breakfast., Disp: 90 tablet, Rfl: 1   ezetimibe  (ZETIA ) 10 MG tablet, Take 1 tablet (10 mg total) by mouth daily. Needs OV for refills, Disp: 90 tablet, Rfl: 3   gabapentin  (NEURONTIN ) 400 MG capsule, Take 1 capsule (400 mg total) by mouth 3 (three) times daily., Disp: 270 capsule, Rfl: 1   metFORMIN  (GLUCOPHAGE ) 500 MG tablet, Take 1 tablet (500 mg total) by mouth 2 (two) times daily with a meal., Disp: 180 tablet, Rfl: 1   sertraline  (ZOLOFT ) 100 MG tablet, Take 1 tablet (100 mg total) by mouth daily., Disp: 90 tablet, Rfl: 1  Allergies  Allergen Reactions   Ibuprofen Swelling    Swelling of eyes and mouth   Meloxicam  Swelling   Voltaren  [Diclofenac ] Swelling    OBJECTIVE: BP 120/74   Pulse 81   Temp 98.2 F (36.8 C)   Wt 187 lb 12.8 oz (85.2 kg)   LMP 10/01/2018 (Approximate)   SpO2 96%   BMI 29.41 kg/m  Physical Exam Vitals and nursing note reviewed.  Constitutional:      General: She is not in acute distress.    Appearance: Normal appearance. She is obese. She is not ill-appearing, toxic-appearing or diaphoretic.  HENT:     Head: Normocephalic and atraumatic.  Eyes:     General: No scleral icterus.       Right eye: No discharge.        Left eye: No discharge.     Extraocular Movements: Extraocular movements intact.     Conjunctiva/sclera: Conjunctivae normal.     Pupils: Pupils are equal, round, and reactive to light.  Cardiovascular:     Rate and Rhythm:  Normal rate and regular rhythm.     Heart sounds: No murmur heard. Pulmonary:     Effort: Pulmonary effort is normal. No respiratory distress.     Breath sounds: Normal breath sounds. No wheezing, rhonchi or rales.  Musculoskeletal:     Right lower leg: No edema.     Left lower leg: No edema.  Skin:    Findings:  No rash.  Neurological:     Mental Status: She is alert and oriented to person, place, and time. Mental status is at baseline.  Psychiatric:        Mood and Affect: Mood normal.        Behavior: Behavior normal.        Thought Content: Thought content normal.        Judgment: Judgment normal.    No results found for this or any previous visit (from the past 48 hours).    ASSESSMENT AND PLAN: Kristy Cooper is a 58 y.o. female present for  Adjustment disorder with mixed anxiety and depressed mood Stable Continue Zoloft  100 mg daily.  We discussed maybe trying a different medication in the future with less weight gain profile. - Follow-up in 4 months, sooner if needed.   Lumbar radiculopathy Stable Continue gabapentin  400 mg 3 times daily.  - f/u 4ms unless needed sooner.   diabetes with hyperlipidemia. Stable Continue metformin  500 mg twice daily> she is already having GI SE and will not tolerate a higher dose.  Continue Farxiga  10 mg daily Tried: Ozempic  (too expensive), Victoza  (too expensive), Farxiga  too expensive Discussed diabetic diet. Increase exercise. Ophthalmology up-to-date 2024- records requested Foot exam completed  01/23/2023 Microalbumin collected today 07/24/2023> +microalb last visit.  Influenza vaccine UTD 2024 Prevnar 20 UTD 2022 Last a1c 6.3> 6.6 > 6.6> 7.6> 7.0>6.5 > 6.6 > 6.9>7.2 >7.3>  a1C today  Hyperlipidemia, unspecified hyperlipidemia type/ Hypertriglyceridemia/ on statin therapy.  -Fhx HD present in mother.  - diet and exercise.  Continue atorvastatin  80 mg daily.  Labs up-to-date  Urinary frequency: Onset 1 month Urine cx  collected- discussed could be related to DM.  Will wait for results and treat if indicated.   Orders Placed This Encounter  Procedures   Urine Culture   Urine Microalbumin w/creat. ratio   Hemoglobin A1c   Meds ordered this encounter  Medications   atorvastatin  (LIPITOR) 80 MG tablet    Sig: Take 1 tablet (80 mg total) by mouth daily.    Dispense:  90 tablet    Refill:  2   dapagliflozin  propanediol (FARXIGA ) 10 MG TABS tablet    Sig: Take 1 tablet (10 mg total) by mouth daily before breakfast.    Dispense:  90 tablet    Refill:  1   ezetimibe  (ZETIA ) 10 MG tablet    Sig: Take 1 tablet (10 mg total) by mouth daily. Needs OV for refills    Dispense:  90 tablet    Refill:  3   gabapentin  (NEURONTIN ) 400 MG capsule    Sig: Take 1 capsule (400 mg total) by mouth 3 (three) times daily.    Dispense:  270 capsule    Refill:  1   metFORMIN  (GLUCOPHAGE ) 500 MG tablet    Sig: Take 1 tablet (500 mg total) by mouth 2 (two) times daily with a meal.    Dispense:  180 tablet    Refill:  1   sertraline  (ZOLOFT ) 100 MG tablet    Sig: Take 1 tablet (100 mg total) by mouth daily.    Dispense:  90 tablet    Refill:  1    Referral Orders  No referral(s) requested today    Napolean Backbone, DO 07/24/2023

## 2023-07-24 NOTE — Patient Instructions (Signed)
 Return in about 15 weeks (around 11/06/2023) for Routine chronic condition follow-up.        Great to see you today.  I have refilled the medication(s) we provide.   If labs were collected or images ordered, we will inform you of  results once we have received them and reviewed. We will contact you either by echart message, or telephone call.  Please give ample time to the testing facility, and our office to run,  receive and review results. Please do not call inquiring of results, even if you can see them in your chart. We will contact you as soon as we are able. If it has been over 1 week since the test was completed, and you have not yet heard from us , then please call us .    - echart message- for normal results that have been seen by the patient already.   - telephone call: abnormal results or if patient has not viewed results in their echart.  If a referral to a specialist was entered for you, please call us  in 2 weeks if you have not heard from the specialist office to schedule.

## 2023-07-25 ENCOUNTER — Telehealth: Payer: Self-pay | Admitting: Family Medicine

## 2023-07-25 LAB — URINE CULTURE
MICRO NUMBER:: 16366185
Result:: NO GROWTH
SPECIMEN QUALITY:: ADEQUATE

## 2023-07-25 MED ORDER — GLIPIZIDE 5 MG PO TABS
5.0000 mg | ORAL_TABLET | Freq: Two times a day (BID) | ORAL | 1 refills | Status: DC
Start: 2023-07-25 — End: 2023-11-18

## 2023-07-25 NOTE — Telephone Encounter (Signed)
 Pt given results/recommendations. Provider notes sent to pt MyChart.

## 2023-07-25 NOTE — Telephone Encounter (Signed)
 Please call patient and communicate medication changes to her verbally  urine protein levels are good A1c increased a great deal of 7.3, now 8.6.  Additional diabetic coverage is required to get her levels to controlled at less than an A1c of 7.  Continue Farxiga  10 mg daily Continue metformin  500 mg twice a day. I have called in a medication called glipizide  for her to take 1 tab twice a day before a meal.  This medication will last be taken just before a meal.

## 2023-07-26 ENCOUNTER — Encounter: Payer: Self-pay | Admitting: Family Medicine

## 2023-09-19 ENCOUNTER — Telehealth: Payer: Self-pay

## 2023-09-19 NOTE — Telephone Encounter (Signed)
 Mychart sent to pt.

## 2023-10-24 ENCOUNTER — Ambulatory Visit: Admitting: Family Medicine

## 2023-10-24 ENCOUNTER — Other Ambulatory Visit: Payer: Self-pay

## 2023-10-24 ENCOUNTER — Encounter: Payer: Self-pay | Admitting: Family Medicine

## 2023-10-24 VITALS — BP 128/80 | HR 76 | Temp 98.2°F | Wt 190.4 lb

## 2023-10-24 DIAGNOSIS — M792 Neuralgia and neuritis, unspecified: Secondary | ICD-10-CM | POA: Diagnosis not present

## 2023-10-24 MED ORDER — PREDNISONE 20 MG PO TABS: ORAL_TABLET | ORAL | 0 refills | Status: DC

## 2023-10-24 MED ORDER — DEXCOM G7 SENSOR MISC: 1 refills | Status: AC

## 2023-10-24 NOTE — Patient Instructions (Addendum)
 Return in about 25 days (around 11/18/2023), or if symptoms worsen or fail to improve, for Routine chronic condition follow-up.        Great to see you today.  I have refilled the medication(s) we provide.   If labs were collected or images ordered, we will inform you of  results once we have received them and reviewed. We will contact you either by echart message, or telephone call.  Please give ample time to the testing facility, and our office to run,  receive and review results. Please do not call inquiring of results, even if you can see them in your chart. We will contact you as soon as we are able. If it has been over 1 week since the test was completed, and you have not yet heard from us , then please call us .    - echart message- for normal results that have been seen by the patient already.   - telephone call: abnormal results or if patient has not viewed results in their echart.  If a referral to a specialist was entered for you, please call us  in 2 weeks if you have not heard from the specialist office to schedule.

## 2023-10-24 NOTE — Progress Notes (Signed)
 Kristy Cooper , Jan 21, 1966, 58 y.o., female MRN: [id number] Patient Care Team    Relationship Specialty Notifications Start End  Catherine Charlies LABOR, DO PCP - General Family Medicine  11/02/15   Marne Kelly Nest, MD Consulting Physician Obstetrics and Gynecology  12/28/21   [facility]  Optometry  04/02/22     Chief Complaint  Patient presents with   Numbness    Both hands. Started new job and has been having issues since; has lump on left hand     Subjective: Kristy Cooper is a 58 y.o. Pt presents for an OV with complaints of bilateral numbness and tingling in her fingers.  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  She has noticed numbness and tingling in the tips of her fingers 1 through 4 on the right, and all of her fingertips on the left.  She reports she is waking up with numbness in her fingertips.  She reports after she takes a shower and shake out her hands a little bit the feeling will return.  Unfortunately patient is allergic to NSAIDs.     02/08/2023   10:42 AM 03/14/2022    3:43 PM 08/17/2021   10:52 AM 04/19/2021    1:09 PM 02/01/2020    3:44 PM  Depression screen PHQ 2/9  Decreased Interest 2 0 0 0 0  Down, Depressed, Hopeless 1 0 1 0 0  PHQ - 2 Score 3 0 1 0 0  Altered sleeping 1 1 2  1   Tired, decreased energy 0 0 1  0  Change in appetite 0 0 2  0  Feeling bad or failure about yourself  0 0 0  0  Trouble concentrating 0 0 0  0  Moving slowly or fidgety/restless 0 0 0  0  Suicidal thoughts 0 0 0  0  PHQ-9 Score 4 1 6  1   Difficult doing work/chores Not difficult at all        Allergies  Allergen Reactions   Ibuprofen Swelling    Swelling of eyes and mouth   Meloxicam  Swelling   Voltaren  [Diclofenac ] Swelling   Social History   Social History Narrative   Married to [place]. 2 chilrn Elbert)- special needs and an adult child. She also has a grandchild.    HS  grad. Artist.   Drinks caffeine. Takes daily vitamin.   Wears seatbelt. Wears bicycle helmet. Smoke detector in the home.    Exercises routinely.    Feels safe in relationships.           Past Medical History:  Diagnosis Date   Arthralgia of left temporomandibular joint 12/24/2018   Bilateral foot pain    chronic- told arthritis and was prescribed tramadol.    Colon polyp 2014   tubular adenoma   Depression    Depression with anxiety    on meds   Diabetes mellitus without complication (HCC)    on meds   GERD (gastroesophageal reflux disease)    hx of-diet controlled   Hyperlipidemia LDL goal <130    on meds   Left breast mass    Obesity    Prediabetes    Right rotator cuff tendinitis 2013   Sciatic leg pain 2019   right leg   Past Surgical History:  Procedure Laterality Date   BREAST LUMPECTOMY WITH RADIOACTIVE SEED LOCALIZATION Left 11/25/2019   Procedure: RADIOACTIVE SEED  GUIDED LEFT BREAST LUMPECTOMY;  Surgeon: Vernetta Berg, MD;  Location: [facility];  Service: General;  Laterality: Left;  LMA   CESAREAN SECTION     COLONOSCOPY  2016   [facility] Gastro-MAC-prep exc-TA   POLYPECTOMY  2016   TA   Family History  Problem Relation Age of Onset   Colon polyps Mother 64   Breast cancer Mother    Diabetes Mother    Mental illness Mother    Heart disease Mother    Arthritis Mother    Arthritis Father    Mental illness Father    Colon cancer Maternal Aunt 7   Colon polyps Maternal Aunt 53   Breast cancer Maternal Aunt    Diabetes Maternal Aunt    Arthritis Maternal Aunt    Colon polyps Maternal Uncle    Colon cancer Maternal Uncle    Diabetes Maternal Uncle    Colon polyps Paternal Aunt    Colon cancer Paternal Aunt    Diabetes Paternal Aunt    Heart disease Paternal Aunt    Esophageal cancer Neg Hx    Stomach cancer Neg Hx    Rectal cancer Neg Hx    Thyroid  cancer Neg Hx    Allergies as of 10/24/2023       Reactions   Ibuprofen Swelling    Swelling of eyes and mouth   Meloxicam  Swelling   Voltaren  [diclofenac ] Swelling        Medication List        Accurate as of October 24, 2023 10:58 AM. If you have any questions, ask your nurse or doctor.          atorvastatin  80 MG tablet Commonly known as: LIPITOR Take 1 tablet (80 mg total) by mouth daily.   dapagliflozin  propanediol 10 MG Tabs tablet Commonly known as: Farxiga  Take 1 tablet (10 mg total) by mouth daily before breakfast.   Dexcom G7 Receiver Espiridion USE TO MONITOR BLOOD GLUCOSE 3 TIMES A DAY   Dexcom G7 Sensor Misc USE TO MONITOR BLOOD GLUCOSE 3 TIMES A DAY   EXCEDRIN MIGRAINE PO Take 3 tablets by mouth daily as needed (migraines.).   ezetimibe  10 MG tablet Commonly known as: Zetia  Take 1 tablet (10 mg total) by mouth daily. Needs OV for refills   gabapentin  400 MG capsule Commonly known as: NEURONTIN  Take 1 capsule (400 mg total) by mouth 3 (three) times daily.   glipiZIDE  5 MG tablet Commonly known as: GLUCOTROL  Take 1 tablet (5 mg total) by mouth 2 (two) times daily before a meal.   meclizine  25 MG tablet Commonly known as: ANTIVERT  Take 1 tablet (25 mg total) by mouth 3 (three) times daily as needed for dizziness.   metFORMIN  500 MG tablet Commonly known as: GLUCOPHAGE  Take 1 tablet (500 mg total) by mouth 2 (two) times daily with a meal.   predniSONE  20 MG tablet Commonly known as: DELTASONE  60 mg x3d, 40 mg x3d, 20 mg x2d, 10 mg x2d Started by: Charlies Bellini   sertraline  100 MG tablet Commonly known as: ZOLOFT  Take 1 tablet (100 mg total) by mouth daily.        All past medical history, surgical history, allergies, family history, immunizations andmedications were updated in the EMR today and reviewed under the history and medication portions of their EMR.     ROS Negative, with the exception of above mentioned in HPI   Objective:  BP 128/80   Pulse 76   Temp 98.2  F (36.8 C)   Wt 190 lb 6.4 oz (86.4 kg)   LMP  10/01/2018 (Approximate)   SpO2 98%   BMI 29.82 kg/m  Body mass index is 29.82 kg/m. Physical Exam Vitals and nursing note reviewed.  Constitutional:      General: She is not in acute distress.    Appearance: Normal appearance. She is normal weight. She is not ill-appearing or toxic-appearing.  HENT:     Head: Normocephalic and atraumatic.  Eyes:     General: No scleral icterus.       Right eye: No discharge.        Left eye: No discharge.     Extraocular Movements: Extraocular movements intact.     Conjunctiva/sclera: Conjunctivae normal.     Pupils: Pupils are equal, round, and reactive to light.  Musculoskeletal:        General: Normal range of motion.     Comments: Bilateral upper extremity: No erythema, no swelling.  Small bony prominence medial left wrist with mild tenderness to palpation.  Full range of motion of wrist and fingers.  Negative Tinel's at wrist and elbow.  Positive Phalen's.  Neurovascularly intact distally  Skin:    Findings: No rash.  Neurological:     Mental Status: She is alert and oriented to person, place, and time. Mental status is at baseline.     Motor: No weakness.     Coordination: Coordination normal.     Gait: Gait normal.  Psychiatric:        Mood and Affect: Mood normal.        Behavior: Behavior normal.        Thought Content: Thought content normal.        Judgment: Judgment normal.     No results found. No results found. No results found for this or any previous visit (from the past 24 hours).  Assessment/Plan: John Sam is a 58 y.o. female present for OV for  Neuritis-bilateral hands (Primary) Discussed neuritis symptoms, treatment and prevention. She is allergic to NSAIDs, prednisone  taper prescribed. Encouraged her to purchase over-the-counter nightguard's for carpal tunnel. Encouraged her to perform carpal tunnel stretches few times a day. 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 We discussed may need to consider daily compression or wrist splints if reoccurs in new role.   Reviewed expectations re: course of current medical issues. Discussed self-management of symptoms. Outlined signs and symptoms indicating need for more acute intervention. Patient verbalized understanding and all questions were answered. Patient received an After-Visit Summary.    No orders of the defined types were placed in this encounter.  Meds ordered this encounter  Medications   predniSONE  (DELTASONE ) 20 MG tablet    Sig: 60 mg x3d, 40 mg x3d, 20 mg x2d, 10 mg x2d    Dispense:  18 tablet    Refill:  0   Referral Orders  No referral(s) requested today     Note is dictated utilizing voice recognition software. Although note has been proof read prior to signing, occasional typographical errors still can be missed. If any questions arise, please do not hesitate to call for verification.   electronically signed by:  Charlies Bellini, DO  [facility] [place]

## 2023-11-18 ENCOUNTER — Encounter: Payer: Self-pay | Admitting: Family Medicine

## 2023-11-18 ENCOUNTER — Ambulatory Visit: Admitting: Family Medicine

## 2023-11-18 ENCOUNTER — Other Ambulatory Visit (HOSPITAL_COMMUNITY)
Admission: RE | Admit: 2023-11-18 | Discharge: 2023-11-18 | Disposition: A | Discharge status: Home / Self Care | Source: Ambulatory Visit | Attending: Family Medicine | Admitting: Family Medicine

## 2023-11-18 VITALS — BP 120/70 | HR 83 | Temp 98.1°F | Wt 186.2 lb

## 2023-11-18 DIAGNOSIS — E785 Hyperlipidemia, unspecified: Secondary | ICD-10-CM | POA: Diagnosis not present

## 2023-11-18 DIAGNOSIS — Z6830 Body mass index (BMI) 30.0-30.9, adult: Secondary | ICD-10-CM

## 2023-11-18 DIAGNOSIS — F4323 Adjustment disorder with mixed anxiety and depressed mood: Secondary | ICD-10-CM

## 2023-11-18 DIAGNOSIS — E1169 Type 2 diabetes mellitus with other specified complication: Secondary | ICD-10-CM | POA: Diagnosis not present

## 2023-11-18 DIAGNOSIS — M5136 Other intervertebral disc degeneration, lumbar region with discogenic back pain only: Secondary | ICD-10-CM

## 2023-11-18 DIAGNOSIS — Z7985 Long-term (current) use of injectable non-insulin antidiabetic drugs: Secondary | ICD-10-CM

## 2023-11-18 DIAGNOSIS — N898 Other specified noninflammatory disorders of vagina: Secondary | ICD-10-CM

## 2023-11-18 DIAGNOSIS — Z7984 Long term (current) use of oral hypoglycemic drugs: Secondary | ICD-10-CM

## 2023-11-18 DIAGNOSIS — Z79899 Other long term (current) drug therapy: Secondary | ICD-10-CM

## 2023-11-18 DIAGNOSIS — M5416 Radiculopathy, lumbar region: Secondary | ICD-10-CM

## 2023-11-18 LAB — COMPREHENSIVE METABOLIC PANEL WITH GFR
ALT: 22 U/L (ref 0–35)
AST: 19 U/L (ref 0–37)
Albumin: 4.5 g/dL (ref 3.5–5.2)
Alkaline Phosphatase: 101 U/L (ref 39–117)
BUN: 18 mg/dL (ref 6–23)
CO2: 25 meq/L (ref 19–32)
Calcium: 9.7 mg/dL (ref 8.4–10.5)
Chloride: 104 meq/L (ref 96–112)
Creatinine, Ser: 0.69 mg/dL (ref 0.40–1.20)
GFR: 95.94 mL/min (ref >60.00)
Glucose, Bld: 214 mg/dL — ABNORMAL HIGH (ref 70–99)
Potassium: 4.3 meq/L (ref 3.5–5.1)
Sodium: 142 meq/L (ref 135–145)
Total Bilirubin: 0.3 mg/dL (ref 0.2–1.2)
Total Protein: 6.8 g/dL (ref 6.0–8.3)

## 2023-11-18 LAB — LIPID PANEL
Cholesterol: 163 mg/dL (ref 0–200)
HDL: 36.8 mg/dL — ABNORMAL LOW (ref >39.00)
NonHDL: 126.31
Total CHOL/HDL Ratio: 4
Triglycerides: 426 mg/dL — ABNORMAL HIGH (ref 0.0–149.0)
VLDL: 85.2 mg/dL — ABNORMAL HIGH (ref 0.0–40.0)

## 2023-11-18 LAB — LDL CHOLESTEROL, DIRECT: Direct LDL: 96 mg/dL

## 2023-11-18 LAB — CBC
HCT: 43.6 % (ref 36.0–46.0)
Hemoglobin: 14.1 g/dL (ref 12.0–15.0)
MCHC: 32.3 g/dL (ref 30.0–36.0)
MCV: 84.2 fl (ref 78.0–100.0)
Platelets: 311 K/uL (ref 150.0–400.0)
RBC: 5.18 Mil/uL — ABNORMAL HIGH (ref 3.87–5.11)
RDW: 14.5 % (ref 11.5–15.5)
WBC: 9 K/uL (ref 4.0–10.5)

## 2023-11-18 LAB — TSH: TSH: 0.96 u[IU]/mL (ref 0.35–5.50)

## 2023-11-18 LAB — HEMOGLOBIN A1C: Hgb A1c MFr Bld: 8.6 % — ABNORMAL HIGH (ref 4.6–6.5)

## 2023-11-18 MED ORDER — METFORMIN HCL 500 MG PO TABS: 500.0000 mg | ORAL_TABLET | Freq: Two times a day (BID) | ORAL | 1 refills | Status: AC

## 2023-11-18 MED ORDER — GLIPIZIDE 5 MG PO TABS: 5.0000 mg | ORAL_TABLET | Freq: Two times a day (BID) | ORAL | 1 refills | Status: AC

## 2023-11-18 MED ORDER — DAPAGLIFLOZIN PROPANEDIOL 10 MG PO TABS: 10.0000 mg | ORAL_TABLET | Freq: Every day | ORAL | 1 refills | Status: AC

## 2023-11-18 MED ORDER — ATORVASTATIN CALCIUM 80 MG PO TABS: 80.0000 mg | ORAL_TABLET | Freq: Every day | ORAL | 2 refills | Status: AC

## 2023-11-18 MED ORDER — GABAPENTIN 400 MG PO CAPS: 400.0000 mg | ORAL_CAPSULE | Freq: Three times a day (TID) | ORAL | 1 refills | Status: AC

## 2023-11-18 MED ORDER — SERTRALINE HCL 100 MG PO TABS: 100.0000 mg | ORAL_TABLET | Freq: Every day | ORAL | 1 refills | Status: DC

## 2023-11-18 NOTE — Patient Instructions (Signed)

## 2023-11-18 NOTE — Progress Notes (Signed)
 Patient Care Team    Relationship Specialty Notifications Start End  Catherine Charlies LABOR, DO PCP - General Family Medicine  11/02/15   Marne Kelly Nest, MD Consulting Physician Obstetrics and Gynecology  12/28/21   [facility]  Optometry  04/02/22      SUBJECTIVE Chief Complaint  Patient presents with   Diabetes    Pt is not fasting.  Pt also mentions brown discharge and vaginal itching for a few days. Pt has not tried anything.     HPI: Kristy Cooper is a  58 y.o. female presents today for chronic medical conditions management Adjustment disorder with mixed anxiety and depressed mood She reports compliance with Zoloft  100 mg daily and feels it is working well for her at this dose.   diabetes/obesity Pt reports compliance with metformin  500 BID, glipizide  twice daily with meal and farxiga  10 mg qd Tried: Ozempic -patient tolerated, insurance declined.  Victoza -too expensive, Farxiga  too expensive Patient denies dizziness, hyperglycemic or hypoglycemic events. Patient denies numbness, tingling in the extremities or nonhealing wounds of feet.   Hyperlipidemia, unspecified hyperlipidemia type/ Hypertriglyceridemia Patient has a history of hyperlipidemia and hypertriglyceridemia.fhx HD.    She reports compliance with lipitor 80 mg qd.     Low back pain/ DDD (degenerative disc disease),/ Lumbar radiculopathy: Patient reports compliance with gabapentin  400 mg 3 times a day.  She feels it is working very well for her lower back radiculopathy. Prior note This is a new problem for patient. She states she has had right-sided low back pain for about one month. There was no increase in activity, lifting or known injury acutely. She states she did have an injury when she was a young child, which did not need any medical intervention, but nothing recent. However over the last month she started with right sided lower back pain, that radiated into her buttock and now is  radiating down to her right foot. She reports tingling sensation intermittently in her right foot. She denies any weakness. She denies any bladder or bowel changes.   Patient reports new problem today of vaginal discharge and vaginal itching that started a couple days ago.  She reports she does not have a menstrual cycle for about 3 years.  She has noticed some mildly brown increased vaginal discharge over the last couple days.  She is established with gynecology.  She has not discussed menopausal changes with her gynecologist.     02/08/2023   10:42 AM 03/14/2022    3:43 PM 08/17/2021   10:52 AM 04/19/2021    1:09 PM 02/01/2020    3:44 PM  Depression screen PHQ 2/9  Decreased Interest 2 0 0 0 0  Down, Depressed, Hopeless 1 0 1 0 0  PHQ - 2 Score 3 0 1 0 0  Altered sleeping 1 1 2  1   Tired, decreased energy 0 0 1  0  Change in appetite 0 0 2  0  Feeling bad or failure about yourself  0 0 0  0  Trouble concentrating 0 0 0  0  Moving slowly or fidgety/restless 0 0 0  0  Suicidal thoughts 0 0 0  0  PHQ-9 Score 4 1 6  1   Difficult doing work/chores Not difficult at all          02/08/2023   10:43 AM 03/14/2022    3:43 PM 08/17/2021   10:52 AM 02/01/2020    3:44 PM  GAD 7 : Generalized Anxiety Score  Nervous, Anxious, on Edge 1 0 0 0  Control/stop worrying 1 0 2 1  Worry too much - different things 0 0 2 1  Trouble relaxing 0 0 1 0  Restless 0 0 1 0  Easily annoyed or irritable 0 0 0 0  Afraid - awful might happen 0 0 0 0  Total GAD 7 Score 2 0 6 2  Anxiety Difficulty Somewhat difficult       ROS: See pertinent positives and negatives per HPI.  Patient Active Problem List   Diagnosis Date Noted   Not immune to measles 05/11/2022   Type 2 diabetes mellitus with hyperlipidemia (HCC)-LDL goal <100 11/23/2020   BMI 30.0-30.9,adult 11/23/2020   On statin therapy 11/23/2020   DDD (degenerative disc disease), lumbar 09/03/2016   Lumbar radiculopathy 09/03/2016   Adjustment disorder  with mixed anxiety and depressed mood 07/30/2016   History of colon polyps 11/02/2015   Morbid obesity (HCC) 11/02/2015    Social History   Tobacco Use   Smoking status: Former   Smokeless tobacco: Never  Substance Use Topics   Alcohol use: Yes    Comment: occ    Current Outpatient Medications:    Aspirin-Acetaminophen -Caffeine (EXCEDRIN MIGRAINE PO), Take 3 tablets by mouth daily as needed (migraines.). , Disp: , Rfl:    Continuous Blood Gluc Receiver (DEXCOM G7 RECEIVER) DEVI, USE TO MONITOR BLOOD GLUCOSE 3 TIMES A DAY, Disp: 3 each, Rfl: 0   Continuous Glucose Sensor (DEXCOM G7 SENSOR) MISC, USE TO MONITOR BLOOD GLUCOSE 3 TIMES A DAY, Disp: 3 each, Rfl: 1   ezetimibe  (ZETIA ) 10 MG tablet, Take 1 tablet (10 mg total) by mouth daily. Needs OV for refills, Disp: 90 tablet, Rfl: 3   meclizine  (ANTIVERT ) 25 MG tablet, Take 1 tablet (25 mg total) by mouth 3 (three) times daily as needed for dizziness., Disp: 15 tablet, Rfl: 0   atorvastatin  (LIPITOR) 80 MG tablet, Take 1 tablet (80 mg total) by mouth daily., Disp: 90 tablet, Rfl: 2   dapagliflozin  propanediol (FARXIGA ) 10 MG TABS tablet, Take 1 tablet (10 mg total) by mouth daily before breakfast., Disp: 90 tablet, Rfl: 1   gabapentin  (NEURONTIN ) 400 MG capsule, Take 1 capsule (400 mg total) by mouth 3 (three) times daily., Disp: 270 capsule, Rfl: 1   glipiZIDE  (GLUCOTROL ) 5 MG tablet, Take 1 tablet (5 mg total) by mouth 2 (two) times daily before a meal., Disp: 180 tablet, Rfl: 1   metFORMIN  (GLUCOPHAGE ) 500 MG tablet, Take 1 tablet (500 mg total) by mouth 2 (two) times daily with a meal., Disp: 180 tablet, Rfl: 1   sertraline  (ZOLOFT ) 100 MG tablet, Take 1 tablet (100 mg total) by mouth daily., Disp: 90 tablet, Rfl: 1  Allergies  Allergen Reactions   Ibuprofen Swelling    Swelling of eyes and mouth   Meloxicam  Swelling   Voltaren  [Diclofenac ] Swelling    OBJECTIVE: BP 120/70   Pulse 83   Temp 98.1 F (36.7 C)   Wt 186 lb 3.2 oz  (84.5 kg)   LMP 10/01/2018 (Approximate)   SpO2 98%   BMI 29.16 kg/m  Physical Exam Vitals and nursing note reviewed.  Constitutional:      General: She is not in acute distress.    Appearance: Normal appearance. She is obese. She is not ill-appearing, toxic-appearing or diaphoretic.  HENT:     Head: Normocephalic and atraumatic.  Eyes:     General: No scleral icterus.  Right eye: No discharge.        Left eye: No discharge.     Extraocular Movements: Extraocular movements intact.     Conjunctiva/sclera: Conjunctivae normal.     Pupils: Pupils are equal, round, and reactive to light.  Cardiovascular:     Rate and Rhythm: Normal rate and regular rhythm.     Heart sounds: No murmur heard. Pulmonary:     Effort: Pulmonary effort is normal. No respiratory distress.     Breath sounds: Normal breath sounds. No wheezing, rhonchi or rales.  Musculoskeletal:     Cervical back: Neck supple.     Right lower leg: No edema.     Left lower leg: No edema.  Skin:    Findings: No rash.  Neurological:     Mental Status: She is alert and oriented to person, place, and time. Mental status is at baseline.  Psychiatric:        Mood and Affect: Mood normal.        Behavior: Behavior normal.        Thought Content: Thought content normal.        Judgment: Judgment normal.    No results found for this or any previous visit (from the past 48 hours).    ASSESSMENT AND PLAN: Kristy Cooper is a 58 y.o. female present for  Adjustment disorder with mixed anxiety and depressed mood Stable Continue Zoloft  100 mg daily.  We discussed maybe trying a different medication in the future with less weight gain profile.   Lumbar radiculopathy Stable Continue gabapentin  400 mg 3 times daily.    diabetes with hyperlipidemia. Stable Continue metformin  500 mg twice daily> she is already having GI SE and will not tolerate a higher dose.  Continue Farxiga  10 mg daily Continue glipizide  5 mg  twice daily.  Patient did have 1 level glucose last night, she had only eaten a small amount of a sandwich and then fell asleep when she had taken her glipizide .  We discussed that must be taken with a full meal.  If not eating a full meal she should not take the medication. Tried: Ozempic  (too expensive), Victoza  (too expensive), Farxiga  too expensive Discussed diabetic diet. Increase exercise. Ophthalmology up-to-date 05/2023 Foot exam completed  11/18/2023 Microalbumin collected today 07/2023> +microalb last visit.  Influenza vaccine UTD 2024 Prevnar 20 UTD 2022 Last a1c 6.3> 6.6 > 6.6> 7.6> 7.0>6.5 > 6.6 > 6.9>7.2 >7.3>collected a1C today  Hyperlipidemia, unspecified hyperlipidemia type/ Hypertriglyceridemia/ on statin therapy.  -Fhx HD present in mother.  - diet and exercise.  Continue atorvastatin  80 mg daily.   Vaginal discharge and irritation: Patient feels the symptoms are coming from her bladder/urine.  Will collect urinalysis with culture reflex and vaginal self cytology swabbing. Will hold treatment until results received.  Orders Placed This Encounter  Procedures   Comp Met (CMET)   CBC   Hemoglobin A1c   TSH   Lipid panel   Urinalysis w microscopic + reflex cultur   Meds ordered this encounter  Medications   atorvastatin  (LIPITOR) 80 MG tablet    Sig: Take 1 tablet (80 mg total) by mouth daily.    Dispense:  90 tablet    Refill:  2   dapagliflozin  propanediol (FARXIGA ) 10 MG TABS tablet    Sig: Take 1 tablet (10 mg total) by mouth daily before breakfast.    Dispense:  90 tablet    Refill:  1   gabapentin  (NEURONTIN ) 400 MG capsule  Sig: Take 1 capsule (400 mg total) by mouth 3 (three) times daily.    Dispense:  270 capsule    Refill:  1   glipiZIDE  (GLUCOTROL ) 5 MG tablet    Sig: Take 1 tablet (5 mg total) by mouth 2 (two) times daily before a meal.    Dispense:  180 tablet    Refill:  1   metFORMIN  (GLUCOPHAGE ) 500 MG tablet    Sig: Take 1 tablet (500 mg  total) by mouth 2 (two) times daily with a meal.    Dispense:  180 tablet    Refill:  1   sertraline  (ZOLOFT ) 100 MG tablet    Sig: Take 1 tablet (100 mg total) by mouth daily.    Dispense:  90 tablet    Refill:  1    Referral Orders  No referral(s) requested today    Charlies Bellini, DO 11/18/2023

## 2023-11-19 ENCOUNTER — Ambulatory Visit: Payer: Self-pay | Admitting: Family Medicine

## 2023-11-19 LAB — URINALYSIS W MICROSCOPIC + REFLEX CULTURE
Bacteria, UA: NONE SEEN /HPF
Bilirubin Urine: NEGATIVE
Hgb urine dipstick: NEGATIVE
Ketones, ur: NEGATIVE
Leukocyte Esterase: NEGATIVE
Nitrites, Initial: NEGATIVE
Protein, ur: NEGATIVE
RBC / HPF: NONE SEEN /HPF (ref 0–2)
Specific Gravity, Urine: 1.033 (ref 1.001–1.035)
Squamous Epithelial / HPF: NONE SEEN /HPF (ref <=5)
WBC, UA: NONE SEEN /HPF (ref 0–5)
pH: <=5 — AB (ref 5.0–8.0)

## 2023-11-19 LAB — CERVICOVAGINAL ANCILLARY ONLY
Bacterial Vaginitis (gardnerella): NEGATIVE
Chlamydia: NEGATIVE
Comment: NEGATIVE
Comment: NEGATIVE
Comment: NEGATIVE
Comment: NORMAL
Neisseria Gonorrhea: NEGATIVE
Trichomonas: NEGATIVE

## 2023-11-19 LAB — NO CULTURE INDICATED

## 2023-11-19 MED ORDER — TIRZEPATIDE 2.5 MG/0.5ML ~~LOC~~ SOAJ: 2.5000 mg | SUBCUTANEOUS | 2 refills | Status: DC

## 2023-11-19 MED ORDER — FLUCONAZOLE 150 MG PO TABS: 150.0000 mg | ORAL_TABLET | Freq: Every day | ORAL | 0 refills | Status: DC

## 2023-11-21 ENCOUNTER — Ambulatory Visit: Admitting: Family Medicine

## 2023-11-27 ENCOUNTER — Encounter: Payer: Self-pay | Admitting: Family Medicine

## 2023-11-27 DIAGNOSIS — M792 Neuralgia and neuritis, unspecified: Secondary | ICD-10-CM

## 2023-11-27 NOTE — Telephone Encounter (Signed)
 Please advise patient I have placed a referral to sports med for them to evaluate  and provide cortisone shot if they feel appropriate.

## 2023-12-07 ENCOUNTER — Encounter: Payer: Self-pay | Admitting: Family Medicine

## 2023-12-11 MED ORDER — MNEXSPIKE 10 MCG/0.2ML IM SUSY: 10.0000 ug | PREFILLED_SYRINGE | Freq: Once | INTRAMUSCULAR | 0 refills | Status: AC

## 2023-12-25 ENCOUNTER — Encounter: Payer: Self-pay | Admitting: Family Medicine

## 2023-12-31 ENCOUNTER — Ambulatory Visit: Admitting: Family Medicine

## 2023-12-31 ENCOUNTER — Encounter: Payer: Self-pay | Admitting: Family Medicine

## 2023-12-31 VITALS — BP 122/82 | HR 80 | Temp 98.2°F | Wt 181.0 lb

## 2023-12-31 DIAGNOSIS — Z209 Contact with and (suspected) exposure to unspecified communicable disease: Secondary | ICD-10-CM | POA: Diagnosis not present

## 2023-12-31 NOTE — Patient Instructions (Addendum)

## 2023-12-31 NOTE — Progress Notes (Signed)
 Kristy Cooper , 16-May-1965, 59 y.o., female MRN: [id number] Patient Care Team    Relationship Specialty Notifications Start End  Catherine Charlies LABOR, DO PCP - General Family Medicine  11/02/15   Marne Kelly Nest, MD Consulting Physician Obstetrics and Gynecology  12/28/21   [facility]  Optometry  04/02/22     Chief Complaint  Patient presents with   Finger Injury    Pt pricked her finger twice in one month by sewing machine needle at work; pt is concerned she could have been exposed to something due to multiple people using the name needle. Pt unsure what she could have been exposed to.      Subjective: Kristy Cooper is a 59 y.o. Pt presents for an OV with concerns of expsure to ID. 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 Over the last month she has been stuck by a needle twice that is shared by other employees. No symptoms. She would like tested today      02/08/2023   10:42 AM 03/14/2022    3:43 PM 08/17/2021   10:52 AM 04/19/2021    1:09 PM 02/01/2020    3:44 PM  Depression screen PHQ 2/9  Decreased Interest 2 0 0 0 0  Down, Depressed, Hopeless 1 0 1 0 0  PHQ - 2 Score 3 0 1 0 0  Altered sleeping 1 1 2  1   Tired, decreased energy 0 0 1  0  Change in appetite 0 0 2  0  Feeling bad or failure about yourself  0 0 0  0  Trouble concentrating 0 0 0  0  Moving slowly or fidgety/restless 0 0 0  0  Suicidal thoughts 0 0 0  0  PHQ-9 Score 4 1 6  1   Difficult doing work/chores Not difficult at all        Allergies  Allergen Reactions   Ibuprofen Swelling    Swelling of eyes and mouth   Meloxicam  Swelling   Voltaren  [Diclofenac ] Swelling   Social History   Social History Narrative   Married [place]. 2 chilrn Elbert)- special needs and an adult child. She also has a grandchild.    HS grad. Artist.   Drinks caffeine. Takes daily vitamin.   Wears seatbelt. Wears bicycle helmet. Smoke detector in the home.    Exercises  routinely.    Feels safe in relationships.           Past Medical History:  Diagnosis Date   Arthralgia of left temporomandibular joint 12/24/2018   Bilateral foot pain    chronic- told arthritis and was prescribed tramadol.    Colon polyp 2014   tubular adenoma   Depression    Depression with anxiety    on meds   Diabetes mellitus without complication (HCC)    on meds   GERD (gastroesophageal reflux disease)    hx of-diet controlled   Hyperlipidemia LDL goal <130    on meds   Left breast mass    Obesity    Prediabetes    Right rotator cuff tendinitis 2013   Sciatic leg pain 2019   right leg   Past Surgical History:  Procedure Laterality Date   BREAST LUMPECTOMY WITH RADIOACTIVE SEED LOCALIZATION Left 11/25/2019   Procedure: RADIOACTIVE SEED GUIDED LEFT BREAST LUMPECTOMY;  Surgeon: Vernetta Berg, MD;  Location: [facility];  Service: General;  Laterality: Left;  LMA   CESAREAN SECTION  COLONOSCOPY  2016   [facility] Gastro-MAC-prep exc-TA   POLYPECTOMY  2016   TA   Family History  Problem Relation Age of Onset   Colon polyps Mother 6   Breast cancer Mother    Diabetes Mother    Mental illness Mother    Heart disease Mother    Arthritis Mother    Arthritis Father    Mental illness Father    Colon cancer Maternal Aunt 25   Colon polyps Maternal Aunt 9   Breast cancer Maternal Aunt    Diabetes Maternal Aunt    Arthritis Maternal Aunt    Colon polyps Maternal Uncle    Colon cancer Maternal Uncle    Diabetes Maternal Uncle    Colon polyps Paternal Aunt    Colon cancer Paternal Aunt    Diabetes Paternal Aunt    Heart disease Paternal Aunt    Esophageal cancer Neg Hx    Stomach cancer Neg Hx    Rectal cancer Neg Hx    Thyroid  cancer Neg Hx    Allergies as of 12/31/2023       Reactions   Ibuprofen Swelling   Swelling of eyes and mouth   Meloxicam  Swelling   Voltaren  [diclofenac ] Swelling        Medication List        Accurate as of  December 31, 2023  1:40 PM. If you have any questions, ask your nurse or doctor.          atorvastatin  80 MG tablet Commonly known as: LIPITOR Take 1 tablet (80 mg total) by mouth daily.   dapagliflozin  propanediol 10 MG Tabs tablet Commonly known as: Farxiga  Take 1 tablet (10 mg total) by mouth daily before breakfast.   Dexcom G7 Receiver Espiridion USE TO MONITOR BLOOD GLUCOSE 3 TIMES A DAY   Dexcom G7 Sensor Misc USE TO MONITOR BLOOD GLUCOSE 3 TIMES A DAY   EXCEDRIN MIGRAINE PO Take 3 tablets by mouth daily as needed (migraines.).   ezetimibe  10 MG tablet Commonly known as: Zetia  Take 1 tablet (10 mg total) by mouth daily. Needs OV for refills   fluconazole  150 MG tablet Commonly known as: Diflucan  Take 1 tablet (150 mg total) by mouth daily.   gabapentin  400 MG capsule Commonly known as: NEURONTIN  Take 1 capsule (400 mg total) by mouth 3 (three) times daily.   glipiZIDE  5 MG tablet Commonly known as: GLUCOTROL  Take 1 tablet (5 mg total) by mouth 2 (two) times daily before a meal.   meclizine  25 MG tablet Commonly known as: ANTIVERT  Take 1 tablet (25 mg total) by mouth 3 (three) times daily as needed for dizziness.   metFORMIN  500 MG tablet Commonly known as: GLUCOPHAGE  Take 1 tablet (500 mg total) by mouth 2 (two) times daily with a meal.   sertraline  100 MG tablet Commonly known as: ZOLOFT  Take 1 tablet (100 mg total) by mouth daily.   tirzepatide  2.5 MG/0.5ML Pen Commonly known as: MOUNJARO  Inject 2.5 mg into the skin once a week.        All past medical history, surgical history, allergies, family history, immunizations andmedications were updated in the EMR today and reviewed under the history and medication portions of their EMR.     ROS Negative, with the exception of above mentioned in HPI   Objective:  BP 122/82   Pulse 80   Temp 98.2 F (36.8 C)   Wt 181 lb (82.1 kg)   LMP 10/01/2018 (Approximate)   SpO2  98%   BMI 28.35 kg/m  Body  mass index is 28.35 kg/m.  Physical Exam Vitals and nursing note reviewed.  Constitutional:      General: She is not in acute distress.    Appearance: Normal appearance. She is normal weight. She is not ill-appearing or toxic-appearing.  HENT:     Head: Normocephalic and atraumatic.  Eyes:     General: No scleral icterus.       Right eye: No discharge.        Left eye: No discharge.     Extraocular Movements: Extraocular movements intact.     Conjunctiva/sclera: Conjunctivae normal.     Pupils: Pupils are equal, round, and reactive to light.  Skin:    Findings: No erythema or rash.     Comments: Left Index finger without erythema or drainage. No swelling.   Neurological:     Mental Status: She is alert and oriented to person, place, and time. Mental status is at baseline.     Motor: No weakness.     Coordination: Coordination normal.     Gait: Gait normal.  Psychiatric:        Mood and Affect: Mood normal.        Behavior: Behavior normal.        Thought Content: Thought content normal.        Judgment: Judgment normal.      No results found. No results found. No results found for this or any previous visit (from the past 24 hours).  Assessment/Plan: Kristy Cooper is a 58 y.o. female present for OV for  Infectious disease exposure (Primary) - Hepatitis, Acute - HIV antibody (with reflex) Discussed cleaning machine prior to use.  F/u prn  Reviewed expectations re: course of current medical issues. Discussed self-management of symptoms. Outlined signs and symptoms indicating need for more acute intervention. Patient verbalized understanding and all questions were answered. Patient received an After-Visit Summary.    No orders of the defined types were placed in this encounter.  No orders of the defined types were placed in this encounter.  Referral Orders  No referral(s) requested today     Note is dictated utilizing voice recognition software.  Although note has been proof read prior to signing, occasional typographical errors still can be missed. If any questions arise, please do not hesitate to call for verification.   electronically signed by:  Charlies Bellini, DO  [facility] [place]

## 2024-01-01 LAB — HIV ANTIBODY (ROUTINE TESTING W REFLEX)
HIV 1&2 Ab, 4th Generation: NONREACTIVE
HIV FINAL INTERPRETATION: NEGATIVE

## 2024-01-01 LAB — HEPATITIS PANEL, ACUTE
Hep A IgM: NONREACTIVE
Hep B C IgM: NONREACTIVE
Hepatitis B Surface Ag: NONREACTIVE
Hepatitis C Ab: NONREACTIVE

## 2024-01-02 ENCOUNTER — Ambulatory Visit: Payer: Self-pay | Admitting: Family Medicine

## 2024-01-25 ENCOUNTER — Encounter: Payer: Self-pay | Admitting: Family Medicine

## 2024-01-30 ENCOUNTER — Encounter: Payer: Self-pay | Admitting: Family Medicine

## 2024-01-30 ENCOUNTER — Ambulatory Visit: Admitting: Family Medicine

## 2024-01-30 VITALS — BP 122/72 | HR 82 | Temp 98.3°F | Wt 180.6 lb

## 2024-01-30 DIAGNOSIS — E1169 Type 2 diabetes mellitus with other specified complication: Secondary | ICD-10-CM | POA: Diagnosis not present

## 2024-01-30 DIAGNOSIS — E785 Hyperlipidemia, unspecified: Secondary | ICD-10-CM | POA: Diagnosis not present

## 2024-01-30 DIAGNOSIS — Z79899 Other long term (current) drug therapy: Secondary | ICD-10-CM | POA: Diagnosis not present

## 2024-01-30 MED ORDER — SERTRALINE HCL 100 MG PO TABS: 150.0000 mg | ORAL_TABLET | Freq: Every day | ORAL | 1 refills | Status: AC

## 2024-01-30 NOTE — Patient Instructions (Signed)
 Continue her routine chronic condition follow-up scheduled.  No follow-ups on file.        Great to see you today.  I have refilled the medication(s) we provide.   If labs were collected or images ordered, we will inform you of  results once we have received them and reviewed. We will contact you either by echart message, or telephone call.  Please give ample time to the testing facility, and our office to run,  receive and review results. Please do not call inquiring of results, even if you can see them in your chart. We will contact you as soon as we are able. If it has been over 1 week since the test was completed, and you have not yet heard from us , then please call us .    - echart message- for normal results that have been seen by the patient already.   - telephone call: abnormal results or if patient has not viewed results in their echart.  If a referral to a specialist was entered for you, please call us  in 2 weeks if you have not heard from the specialist office to schedule.

## 2024-01-30 NOTE — Progress Notes (Addendum)
 Patient Care Team    Relationship Specialty Notifications Start End  Catherine Charlies LABOR, DO PCP - General Family Medicine  11/02/15   Marne Kelly Nest, MD Consulting Physician Obstetrics and Gynecology  12/28/21   [facility]  Optometry  04/02/22      SUBJECTIVE Chief Complaint  Patient presents with   Anxiety    Pt wants to discuss dosage of sertraline .     HPI: Kristy Cooper is a  58 y.o. female presents today to discuss increased anxiousness. Adjustment disorder with mixed anxiety and depressed mood She reports compliance with Zoloft  100 mg daily.  Over the last month she has noticed more anxiousness, stress and increase in sadness.   She reports she does not take much time to herself these days. She is not routinely exercising. She states she had a coworker that had a heart attack at work, and passed away.     2024-02-02   11:14 AM 02/08/2023   10:42 AM 03/14/2022    3:43 PM 08/17/2021   10:52 AM 04/19/2021    1:09 PM  Depression screen PHQ 2/9  Decreased Interest 0 2 0 0 0  Down, Depressed, Hopeless 1 1 0 1 0  PHQ - 2 Score 1 3 0 1 0  Altered sleeping 1 1 1 2    Tired, decreased energy 1 0 0 1   Change in appetite 0 0 0 2   Feeling bad or failure about yourself  0 0 0 0   Trouble concentrating 1 0 0 0   Moving slowly or fidgety/restless 0 0 0 0   Suicidal thoughts 0 0 0 0   PHQ-9 Score 4 4 1 6    Difficult doing work/chores Somewhat difficult Not difficult at all         02/08/2023   10:43 AM 03/14/2022    3:43 PM 08/17/2021   10:52 AM 02/01/2020    3:44 PM  GAD 7 : Generalized Anxiety Score  Nervous, Anxious, on Edge 1 0 0 0  Control/stop worrying 1 0 2 1  Worry too much - different things 0 0 2 1  Trouble relaxing 0 0 1 0  Restless 0 0 1 0  Easily annoyed or irritable 0 0 0 0  Afraid - awful might happen 0 0 0 0  Total GAD 7 Score 2 0 6 2  Anxiety Difficulty Somewhat difficult       ROS: See pertinent positives and negatives  per HPI.  Patient Active Problem List   Diagnosis Date Noted   Not immune to measles 05/11/2022   Type 2 diabetes mellitus with hyperlipidemia (HCC)-LDL goal <100 11/23/2020   BMI 30.0-30.9,adult 11/23/2020   On statin therapy 11/23/2020   DDD (degenerative disc disease), lumbar 09/03/2016   Lumbar radiculopathy 09/03/2016   Adjustment disorder with mixed anxiety and depressed mood 07/30/2016   History of colon polyps 11/02/2015   Morbid obesity (HCC) 11/02/2015    Social History   Tobacco Use   Smoking status: Former   Smokeless tobacco: Never  Substance Use Topics   Alcohol use: Yes    Comment: occ    Current Outpatient Medications:    Aspirin-Acetaminophen -Caffeine (EXCEDRIN MIGRAINE PO), Take 3 tablets by mouth daily as needed (migraines.). , Disp: , Rfl:    atorvastatin  (LIPITOR) 80 MG tablet, Take 1 tablet (80 mg total) by mouth daily., Disp: 90 tablet, Rfl: 2   Continuous Blood Gluc Receiver (DEXCOM G7 RECEIVER) DEVI, USE TO MONITOR  BLOOD GLUCOSE 3 TIMES A DAY, Disp: 3 each, Rfl: 0   Continuous Glucose Sensor (DEXCOM G7 SENSOR) MISC, USE TO MONITOR BLOOD GLUCOSE 3 TIMES A DAY, Disp: 3 each, Rfl: 1   dapagliflozin  propanediol (FARXIGA ) 10 MG TABS tablet, Take 1 tablet (10 mg total) by mouth daily before breakfast., Disp: 90 tablet, Rfl: 1   ezetimibe  (ZETIA ) 10 MG tablet, Take 1 tablet (10 mg total) by mouth daily. Needs OV for refills, Disp: 90 tablet, Rfl: 3   fluconazole  (DIFLUCAN ) 150 MG tablet, Take 1 tablet (150 mg total) by mouth daily., Disp: 1 tablet, Rfl: 0   gabapentin  (NEURONTIN ) 400 MG capsule, Take 1 capsule (400 mg total) by mouth 3 (three) times daily., Disp: 270 capsule, Rfl: 1   glipiZIDE  (GLUCOTROL ) 5 MG tablet, Take 1 tablet (5 mg total) by mouth 2 (two) times daily before a meal., Disp: 180 tablet, Rfl: 1   meclizine  (ANTIVERT ) 25 MG tablet, Take 1 tablet (25 mg total) by mouth 3 (three) times daily as needed for dizziness., Disp: 15 tablet, Rfl: 0    metFORMIN  (GLUCOPHAGE ) 500 MG tablet, Take 1 tablet (500 mg total) by mouth 2 (two) times daily with a meal., Disp: 180 tablet, Rfl: 1   sertraline  (ZOLOFT ) 100 MG tablet, Take 1.5 tablets (150 mg total) by mouth daily., Disp: 135 tablet, Rfl: 1  Allergies  Allergen Reactions   Ibuprofen Swelling    Swelling of eyes and mouth   Meloxicam  Swelling   Voltaren  [Diclofenac ] Swelling    OBJECTIVE: BP 122/72   Pulse 82   Temp 98.3 F (36.8 C)   Wt 180 lb 9.6 oz (81.9 kg)   LMP 10/01/2018 (Approximate)   SpO2 98%   BMI 28.29 kg/m  Physical Exam Vitals and nursing note reviewed.  Constitutional:      General: She is not in acute distress.    Appearance: Normal appearance. She is normal weight. She is not ill-appearing or toxic-appearing.  HENT:     Head: Normocephalic and atraumatic.  Eyes:     General: No scleral icterus.       Right eye: No discharge.        Left eye: No discharge.     Extraocular Movements: Extraocular movements intact.     Conjunctiva/sclera: Conjunctivae normal.     Pupils: Pupils are equal, round, and reactive to light.  Skin:    Findings: No rash.  Neurological:     Mental Status: She is alert and oriented to person, place, and time. Mental status is at baseline.     Motor: No weakness.     Coordination: Coordination normal.     Gait: Gait normal.  Psychiatric:        Mood and Affect: Mood normal.        Behavior: Behavior normal.        Thought Content: Thought content normal.        Judgment: Judgment normal.    No results found for this or any previous visit (from the past 48 hours).  ASSESSMENT AND PLAN: [name] is a 58 y.o. female present for  Adjustment disorder with mixed anxiety and depressed mood Increase in symptoms Increase Zoloft  100 mg> 150  daily.  Encouraged her to make time for herself a few times a week. Encouraged her to start exercising routinely to help with sleeping pattern.  Hyperlipidemia:  Her coworker  having a heart attack at work as her concerned.  She is prescribed atorvastatin  80 and  Zetia  to help with her cholesterol.  She is not diabetic. We discussed cardiac CT screening and she is agreeable to this today.  She is alerts on her dollars out-of-pocket. Patient will be called with results and further plan discussed with  Orders Placed This Encounter  Procedures   CT CARDIAC SCORING (SELF PAY ONLY)   Meds ordered this encounter  Medications   sertraline  (ZOLOFT ) 100 MG tablet    Sig: Take 1.5 tablets (150 mg total) by mouth daily.    Dispense:  135 tablet    Refill:  1    Referral Orders  No referral(s) requested today    Charlies Bellini, DO 01/30/2024

## 2024-02-07 ENCOUNTER — Ambulatory Visit (HOSPITAL_BASED_OUTPATIENT_CLINIC_OR_DEPARTMENT_OTHER)
Admission: RE | Admit: 2024-02-07 | Discharge: 2024-02-07 | Disposition: A | Discharge status: Home / Self Care | Payer: Self-pay | Source: Ambulatory Visit | Attending: Family Medicine | Admitting: Family Medicine

## 2024-02-07 DIAGNOSIS — E1169 Type 2 diabetes mellitus with other specified complication: Secondary | ICD-10-CM | POA: Insufficient documentation

## 2024-02-07 DIAGNOSIS — E785 Hyperlipidemia, unspecified: Secondary | ICD-10-CM | POA: Insufficient documentation

## 2024-02-07 DIAGNOSIS — Z79899 Other long term (current) drug therapy: Secondary | ICD-10-CM | POA: Insufficient documentation

## 2024-02-10 ENCOUNTER — Ambulatory Visit: Payer: Self-pay | Admitting: Family Medicine

## 2024-02-10 DIAGNOSIS — R931 Abnormal findings on diagnostic imaging of heart and coronary circulation: Secondary | ICD-10-CM | POA: Insufficient documentation

## 2024-03-03 ENCOUNTER — Ambulatory Visit: Admitting: Family Medicine

## 2024-03-03 ENCOUNTER — Encounter: Payer: Self-pay | Admitting: Sports Medicine

## 2024-03-03 ENCOUNTER — Ambulatory Visit: Admitting: Sports Medicine

## 2024-03-03 VITALS — BP 110/76 | HR 76 | Temp 98.3°F | Wt 180.4 lb

## 2024-03-03 DIAGNOSIS — R1012 Left upper quadrant pain: Secondary | ICD-10-CM

## 2024-03-03 DIAGNOSIS — R1030 Lower abdominal pain, unspecified: Secondary | ICD-10-CM | POA: Diagnosis not present

## 2024-03-03 DIAGNOSIS — N898 Other specified noninflammatory disorders of vagina: Secondary | ICD-10-CM

## 2024-03-03 LAB — BASIC METABOLIC PANEL WITH GFR
BUN: 17 mg/dL (ref 6–23)
CO2: 28 meq/L (ref 19–32)
Calcium: 9.8 mg/dL (ref 8.4–10.5)
Chloride: 104 meq/L (ref 96–112)
Creatinine, Ser: 0.61 mg/dL (ref 0.40–1.20)
GFR: 98.64 mL/min (ref >60.00)
Glucose, Bld: 128 mg/dL — ABNORMAL HIGH (ref 70–99)
Potassium: 4.2 meq/L (ref 3.5–5.1)
Sodium: 141 meq/L (ref 135–145)

## 2024-03-03 LAB — CBC WITH DIFFERENTIAL/PLATELET
Basophils Absolute: 0 K/uL (ref 0.0–0.1)
Basophils Relative: 0.4 % (ref 0.0–3.0)
Eosinophils Absolute: 0.3 K/uL (ref 0.0–0.7)
Eosinophils Relative: 3.3 % (ref 0.0–5.0)
HCT: 41.3 % (ref 36.0–46.0)
Hemoglobin: 13.8 g/dL (ref 12.0–15.0)
Lymphocytes Relative: 18.3 % (ref 12.0–46.0)
Lymphs Abs: 1.8 K/uL (ref 0.7–4.0)
MCHC: 33.4 g/dL (ref 30.0–36.0)
MCV: 82.9 fl (ref 78.0–100.0)
Monocytes Absolute: 0.6 K/uL (ref 0.1–1.0)
Monocytes Relative: 6 % (ref 3.0–12.0)
Neutro Abs: 6.9 K/uL (ref 1.4–7.7)
Neutrophils Relative %: 72 % (ref 43.0–77.0)
Platelets: 307 K/uL (ref 150.0–400.0)
RBC: 4.98 Mil/uL (ref 3.87–5.11)
RDW: 14 % (ref 11.5–15.5)
WBC: 9.6 K/uL (ref 4.0–10.5)

## 2024-03-03 LAB — POC URINALSYSI DIPSTICK (AUTOMATED)
Bilirubin, UA: NEGATIVE
Blood, UA: NEGATIVE
Glucose, UA: NEGATIVE
Ketones, UA: NEGATIVE
Leukocytes, UA: NEGATIVE
Nitrite, UA: NEGATIVE
Protein, UA: NEGATIVE
Spec Grav, UA: 1.02 (ref 1.010–1.025)
Urobilinogen, UA: 0.2 U/dL
pH, UA: 5.5 (ref 5.0–8.0)

## 2024-03-03 NOTE — Progress Notes (Signed)
 Careteam: Patient Care Team: Catherine Charlies LABOR, DO as PCP - General (Family Medicine) Marne Kelly Nest, MD as Consulting Physician (Obstetrics and Gynecology) [employer] (Optometry)  Allergies  Allergen Reactions   Ibuprofen Swelling    Swelling of eyes and mouth   Meloxicam  Swelling   Voltaren  [Diclofenac ] Swelling    Chief Complaint  Patient presents with   Abdominal Pain    Pt has been having lower abdominal pain for a few days now. Pt denies urinary symptoms but she does have some discharge.    Discussed the use of AI scribe software for clinical note transcription with the patient, who gave verbal consent to proceed.  History of Present Illness    Kristy Cooper is a 58 year old female who presents with chronic abdominal pain and recent changes in bowel habits.  She has been experiencing a 'weird pain' in her lower abdomen, described as stinging, tearing, and sometimes pressure, which has been present since her first C-section. Initially dull, the pain has worsened significantly over the past one to two months. It is intermittent, localized, and does not radiate. She experiences nausea but no vomiting. Her appetite is generally okay, though she sometimes skips meals during the day and eats at night.  She reports a change in bowel habits, with stools becoming smaller and less satisfactory than before. She underwent a colonoscopy in 2023, which revealed polyps that were removed and hemorrhoids. Recent blood work was negative for chlamydia and gonorrhea. No burning or blood in urine, and no vaginal itching. She confirms having regular bowel movements but notes recent changes in stool size and quality.  She has noticed a light brown vaginal discharge for the past two to three months, which comes and goes. There is no associated itching or foul odor. She experiences discomfort during sexual activity. Her last menstrual period was five to six years  ago, and she denies any spotting.  Review of Systems:  Review of Systems  Constitutional:  Negative for chills and fever.  HENT:  Negative for congestion and sore throat.   Respiratory:  Negative for cough, sputum production and shortness of breath.   Cardiovascular:  Negative for chest pain, palpitations and leg swelling.  Gastrointestinal:  Positive for abdominal pain. Negative for heartburn and nausea.  Genitourinary:  Negative for dysuria, frequency and hematuria.  Musculoskeletal:  Negative for falls and myalgias.  Neurological:  Negative for dizziness, sensory change and focal weakness.   Negative unless indicated in HPI.   Patient Active Problem List   Diagnosis Date Noted   Elevated coronary artery calcium  score: 81st percentile, score 21 02/10/2024   Not immune to measles 05/11/2022   Type 2 diabetes mellitus with hyperlipidemia (HCC)-LDL goal <100 11/23/2020   BMI 30.0-30.9,adult 11/23/2020   On statin therapy 11/23/2020   DDD (degenerative disc disease), lumbar 09/03/2016   Lumbar radiculopathy 09/03/2016   Adjustment disorder with mixed anxiety and depressed mood 07/30/2016   History of colon polyps 11/02/2015   Morbid obesity (HCC) 11/02/2015   Past Medical History:  Diagnosis Date   Arthralgia of left temporomandibular joint 12/24/2018   Bilateral foot pain    chronic- told arthritis and was prescribed tramadol.    Colon polyp 2014   tubular adenoma   Depression    Depression with anxiety    on meds   Diabetes mellitus without complication (HCC)    on meds   GERD (gastroesophageal reflux disease)    hx of-diet controlled  Hyperlipidemia LDL goal <130    on meds   Left breast mass    Obesity    Prediabetes    Right rotator cuff tendinitis 2013   Sciatic leg pain 2019   right leg   Past Surgical History:  Procedure Laterality Date   BREAST LUMPECTOMY WITH RADIOACTIVE SEED LOCALIZATION Left 11/25/2019   Procedure: RADIOACTIVE SEED GUIDED LEFT BREAST  LUMPECTOMY;  Surgeon: Vernetta Berg, MD;  Location:  [facility];  Service: General;  Laterality: Left;  LMA   CESAREAN SECTION     COLONOSCOPY  2016   [facility]-prep exc-TA   POLYPECTOMY  2016   TA   Social History   Tobacco Use   Smoking status: Former   Smokeless tobacco: Never  Vaping Use   Vaping status: Never Used  Substance Use Topics   Alcohol use: Yes    Comment: occ   Drug use: No   Family History  Problem Relation Age of Onset   Colon polyps Mother 75   Breast cancer Mother    Diabetes Mother    Mental illness Mother    Heart disease Mother    Arthritis Mother    Arthritis Father    Mental illness Father    Colon cancer Maternal Aunt 53   Colon polyps Maternal Aunt 53   Breast cancer Maternal Aunt    Diabetes Maternal Aunt    Arthritis Maternal Aunt    Colon polyps Maternal Uncle    Colon cancer Maternal Uncle    Diabetes Maternal Uncle    Colon polyps Paternal Aunt    Colon cancer Paternal Aunt    Diabetes Paternal Aunt    Heart disease Paternal Aunt    Esophageal cancer Neg Hx    Stomach cancer Neg Hx    Rectal cancer Neg Hx    Thyroid  cancer Neg Hx    Allergies  Allergen Reactions   Ibuprofen Swelling    Swelling of eyes and mouth   Meloxicam  Swelling   Voltaren  [Diclofenac ] Swelling    Medications: Patient's Medications  New Prescriptions   No medications on file  Previous Medications   ASPIRIN-ACETAMINOPHEN -CAFFEINE (EXCEDRIN MIGRAINE PO)    Take 3 tablets by mouth daily as needed (migraines.).    ATORVASTATIN  (LIPITOR) 80 MG TABLET    Take 1 tablet (80 mg total) by mouth daily.   CONTINUOUS BLOOD GLUC RECEIVER (DEXCOM G7 RECEIVER) DEVI    USE TO MONITOR BLOOD GLUCOSE 3 TIMES A DAY   CONTINUOUS GLUCOSE SENSOR (DEXCOM G7 SENSOR) MISC    USE TO MONITOR BLOOD GLUCOSE 3 TIMES A DAY   DAPAGLIFLOZIN  PROPANEDIOL (FARXIGA ) 10 MG TABS TABLET    Take 1 tablet (10 mg total) by mouth daily before breakfast.   EZETIMIBE  (ZETIA )  10 MG TABLET    Take 1 tablet (10 mg total) by mouth daily. Needs OV for refills   FLUCONAZOLE  (DIFLUCAN ) 150 MG TABLET    Take 1 tablet (150 mg total) by mouth daily.   GABAPENTIN  (NEURONTIN ) 400 MG CAPSULE    Take 1 capsule (400 mg total) by mouth 3 (three) times daily.   GLIPIZIDE  (GLUCOTROL ) 5 MG TABLET    Take 1 tablet (5 mg total) by mouth 2 (two) times daily before a meal.   MECLIZINE  (ANTIVERT ) 25 MG TABLET    Take 1 tablet (25 mg total) by mouth 3 (three) times daily as needed for dizziness.   METFORMIN  (GLUCOPHAGE ) 500 MG TABLET    Take 1 tablet (500  mg total) by mouth 2 (two) times daily with a meal.   SERTRALINE  (ZOLOFT ) 100 MG TABLET    Take 1.5 tablets (150 mg total) by mouth daily.  Modified Medications   No medications on file  Discontinued Medications   No medications on file    Physical Exam: Vitals:   03/03/24 1347  BP: 110/76  Pulse: 76  Temp: 98.3 F (36.8 C)  TempSrc: Oral  SpO2: 96%  Weight: 180 lb 6.4 oz (81.8 kg)   Body mass index is 28.25 kg/m. BP Readings from Last 3 Encounters:  03/03/24 110/76  01/30/24 122/72  12/31/23 122/82   Wt Readings from Last 3 Encounters:  03/03/24 180 lb 6.4 oz (81.8 kg)  01/30/24 180 lb 9.6 oz (81.9 kg)  12/31/23 181 lb (82.1 kg)    Physical Exam Constitutional:      Appearance: Normal appearance.  HENT:     Head: Normocephalic and atraumatic.  Cardiovascular:     Rate and Rhythm: Normal rate and regular rhythm.  Pulmonary:     Effort: Pulmonary effort is normal. No respiratory distress.     Breath sounds: Normal breath sounds. No wheezing.  Abdominal:     General: Bowel sounds are normal. There is no distension.     Tenderness: There is abdominal tenderness in the left lower quadrant. There is no guarding or rebound.     Comments:    Musculoskeletal:        General: No swelling or tenderness.  Skin:    Findings: No rash.  Neurological:     Mental Status: She is alert. Mental status is at baseline.      Sensory: No sensory deficit.     Motor: No weakness.     Labs reviewed: Basic Metabolic Panel: Recent Labs    11/18/23 1047  NA 142  K 4.3  CL 104  CO2 25  GLUCOSE 214*  BUN 18  CREATININE 0.69  CALCIUM  9.7  TSH 0.96   Liver Function Tests: Recent Labs    11/18/23 1047  AST 19  ALT 22  ALKPHOS 101  BILITOT 0.3  PROT 6.8  ALBUMIN 4.5   No results for input(s): LIPASE, AMYLASE in the last 8760 hours. No results for input(s): AMMONIA in the last 8760 hours. CBC: Recent Labs    11/18/23 1047  WBC 9.0  HGB 14.1  HCT 43.6  MCV 84.2  PLT 311.0   Lipid Panel: Recent Labs    11/18/23 1047  CHOL 163  HDL 36.80*  TRIG 426.0 Triglyceride is over 400; calculations on Lipids are invalid.*  CHOLHDL 4  LDLDIRECT 96.0   TSH: Recent Labs    11/18/23 1047  TSH 0.96   A1C: Lab Results  Component Value Date   HGBA1C 8.6 (H) 11/18/2023    Assessment & Plan Lower abdominal pain Chronic worsening lower abdominal pain since 3 months  Intermittent nausea Colonoscopy 2023 Reports some change in bowel movements Will order labs Ct abd/pelvis Urine dipstick neg Orders:   POCT Urinalysis Dipstick (Automated)  Left upper quadrant abdominal pain Urine dipstick neg Reports some change in bowel movements Take stool softeners Orders:   Urine Culture   NuSwab Vaginitis Plus (VG+)   CBC w/Diff   Basic Metabolic Panel (BMET)   CT ABDOMEN PELVIS W CONTRAST; Future  Vaginal discharge Reports intermittent vaginal brownish discharge Instructed patient to follow up with GYN  Orders:   US  Transvaginal Non-OB; Future   No follow-ups on file.: pcp  Jasey Cortez  Blase Beckner

## 2024-03-04 ENCOUNTER — Ambulatory Visit: Payer: Self-pay | Admitting: Sports Medicine

## 2024-03-05 LAB — URINE CULTURE
MICRO NUMBER:: 17302906
SPECIMEN QUALITY:: ADEQUATE

## 2024-03-06 LAB — NUSWAB VAGINITIS PLUS (VG+)
Candida albicans, NAA: NEGATIVE
Candida glabrata, NAA: NEGATIVE
Chlamydia trachomatis, NAA: NEGATIVE
Neisseria gonorrhoeae, NAA: NEGATIVE
Trich vag by NAA: NEGATIVE

## 2024-04-09 ENCOUNTER — Encounter (HOSPITAL_BASED_OUTPATIENT_CLINIC_OR_DEPARTMENT_OTHER): Payer: Self-pay | Admitting: Nurse Practitioner

## 2024-04-09 ENCOUNTER — Other Ambulatory Visit (HOSPITAL_COMMUNITY): Payer: Self-pay

## 2024-04-09 ENCOUNTER — Telehealth: Payer: Self-pay | Admitting: Pharmacy Technician

## 2024-04-09 ENCOUNTER — Ambulatory Visit (INDEPENDENT_AMBULATORY_CARE_PROVIDER_SITE_OTHER): Admitting: Nurse Practitioner

## 2024-04-09 VITALS — BP 112/78 | HR 72 | Ht 67.0 in | Wt 178.0 lb

## 2024-04-09 DIAGNOSIS — E781 Pure hyperglyceridemia: Secondary | ICD-10-CM

## 2024-04-09 DIAGNOSIS — Z7189 Other specified counseling: Secondary | ICD-10-CM | POA: Diagnosis not present

## 2024-04-09 DIAGNOSIS — E1169 Type 2 diabetes mellitus with other specified complication: Secondary | ICD-10-CM

## 2024-04-09 DIAGNOSIS — I2510 Atherosclerotic heart disease of native coronary artery without angina pectoris: Secondary | ICD-10-CM

## 2024-04-09 DIAGNOSIS — R931 Abnormal findings on diagnostic imaging of heart and coronary circulation: Secondary | ICD-10-CM

## 2024-04-09 DIAGNOSIS — E785 Hyperlipidemia, unspecified: Secondary | ICD-10-CM

## 2024-04-09 LAB — BASIC METABOLIC PANEL WITH GFR
BUN/Creatinine Ratio: 20 (ref 9–23)
BUN: 13 mg/dL (ref 6–24)
CO2: 21 mmol/L (ref 20–29)
Calcium: 10.1 mg/dL (ref 8.7–10.2)
Chloride: 102 mmol/L (ref 96–106)
Creatinine, Ser: 0.64 mg/dL (ref 0.57–1.00)
Glucose: 125 mg/dL — ABNORMAL HIGH (ref 70–99)
Potassium: 4 mmol/L (ref 3.5–5.2)
Sodium: 141 mmol/L (ref 134–144)
eGFR: 102 mL/min/1.73

## 2024-04-09 MED ORDER — METOPROLOL TARTRATE 100 MG PO TABS: 100.0000 mg | ORAL_TABLET | Freq: Once | ORAL | 0 refills | Status: AC

## 2024-04-09 MED ORDER — REPATHA SURECLICK 140 MG/ML ~~LOC~~ SOAJ
140.0000 mg | SUBCUTANEOUS | 11 refills | Status: AC
  Filled 2024-04-09 – 2024-04-21 (×2): qty 2, 28d supply, fill #0

## 2024-04-09 NOTE — Addendum Note (Signed)
 Addended by: PERCY ROSALINE HERO on: 04/09/2024 03:39 PM   Modules accepted: Orders

## 2024-04-09 NOTE — Telephone Encounter (Signed)
 Pharmacy Patient Advocate Encounter   Received notification from Physician's Office that prior authorization for vascepa is required/requested.   Insurance verification completed.   The patient is insured through [employer].   Per test claim: PA required; PA submitted to above mentioned insurance via Latent Key/confirmation #/EOC [id number] Status is pending

## 2024-04-09 NOTE — Patient Instructions (Addendum)
 Medication Instructions:   Will get back to you on the price of medications Kristy wants you to start.  Vascepa/PCSK9  *If you need a refill on your cardiac medications before your next appointment, please call your pharmacy*  Lab Work:  TODAY!!!! BMET  If you have labs (blood work) drawn today and your tests are completely normal, you will receive your results only by: MyChart Message (if you have MyChart) OR A paper copy in the mail If you have any lab test that is abnormal or we need to change your treatment, we will call you to review the results.  Testing/Procedures:    Your cardiac CT will be scheduled at one of the below locations:     [facility] [address]  [place], [place] [address]   If scheduled at the [facility] at [address], please enter the parking lot using the [address] entrance and use the FREE valet service at the patient drop-off area. Enter the building and check-in with registration on the main floor.   Please follow these instructions carefully (unless otherwise directed):  An IV will be required for this test and Nitroglycerin will be given.     On the Night Before the Test: Be sure to Drink plenty of water. Do not consume any caffeinated/decaffeinated beverages or chocolate 12 hours prior to your test. Do not take any antihistamines 12 hours prior to your test.   On the Day of the Test: Drink plenty of water until 1 hour prior to the test. Do not eat any food 1 hour prior to test. You may take your regular medications prior to the test.  Take metoprolol  (Lopressor )ONE (1) TABLET BY MOUTH ( 100 MG)  two hours prior to test. Patients who wear a continuous glucose monitor MUST remove the device prior to scanning. FEMALES- please wear underwire-free bra if available, avoid dresses & tight clothing      After the Test: Drink plenty of water. After receiving IV contrast, you may  experience a mild flushed feeling. This is normal. On occasion, you may experience a mild rash up to 24 hours after the test. This is not dangerous. If this occurs, you can take Benadryl 25 mg, Zyrtec, Claritin, or Allegra and increase your fluid intake. (Patients taking Tikosyn should avoid Benadryl, and may take Zyrtec, Claritin, or Allegra) If you experience trouble breathing, this can be serious. If it is severe call 911 IMMEDIATELY. If it is mild, please call our office.  We will call to schedule your test 2-4 weeks out understanding that some insurance companies will need an authorization prior to the service being performed.   For more information and frequently asked questions, please visit our website : [email or web address]  For non-scheduling related questions, please contact the cardiac imaging nurse navigator should you have any questions/concerns: Cardiac Imaging Nurse Navigators Direct Office Dial: [phone]   For scheduling needs, including cancellations and rescheduling, please call Brittany, [phone].   Follow-Up: At [facility], you and your health needs are our priority.  As part of our continuing mission to provide you with exceptional heart care, our providers are all part of one team.  This team includes your primary Cardiologist (physician) and Advanced Practice Providers or APPs (Physician Assistants and Nurse Practitioners) who all work together to provide you with the care you need, when you need it.  Your next appointment:   3 month(s)  Provider:   Rosaline Bane, NP  We recommend signing up for the patient portal called MyChart.  Sign up information is provided on this After Visit Summary.  MyChart is used to connect with patients for Virtual Visits (Telemedicine).  Patients are able to view lab/test results, encounter notes, upcoming appointments, etc.  Non-urgent messages can be sent to your provider as well.   To learn more  about what you can do with MyChart, go to [email or web address].      Adopting a Healthy Lifestyle.   Weight: Know what a healthy weight is for you (roughly BMI <25) and aim to maintain this. You can calculate your body mass index on your smart phone. Unfortunately, this is not the most accurate measure of healthy weight, but it is the simplest measurement to use. A more accurate measurement involves body scanning which measures lean muscle, fat tissue and bony density. We do not have this equipment at [facility].    Diet: Aim for 7+ servings of fruits and vegetables daily Limit animal fats in diet for cholesterol and heart health - choose grass fed whenever available Avoid highly processed foods (fast food burgers, tacos, fried chicken, pizza, hot dogs, french fries)  Saturated fat comes in the form of butter, lard, coconut oil, margarine, partially hydrogenated oils, dairy products, and fat in meat. These increase your risk of cardiovascular disease.  Use healthy plant oils, such as olive, canola, soy, corn, sunflower and peanut.  Whole foods such as fruits, vegetables and whole grains have fiber  Men need > 38 grams of fiber per day Women need > 25 grams of fiber per day  Load up on vegetables and fruits - one-half of your plate: Aim for color and variety, and remember that potatoes dont count. Go for whole grains - one-quarter of your plate: Whole wheat, barley, wheat berries, quinoa, oats, brown rice, and foods made with them. If you want pasta, go with whole wheat pasta. Protein power - one-quarter of your plate: Fish, chicken, beans, and nuts are all healthy, versatile protein sources. Limit red meat. You need carbohydrates for energy! The type of carbohydrate is more important than the amount. Choose carbohydrates such as vegetables, fruits, whole grains, beans, and nuts in the place of white rice, white pasta, potatoes (baked or fried), macaroni and cheese, cakes, cookies, and donuts.   If youre thirsty, drink water. Coffee and tea are good in moderation, but skip sugary drinks and limit milk and dairy products to one or two daily servings. Keep sugar intake at 6 teaspoons or 24 grams or LESS       Exercise: Aim for 150 min of moderate intensity exercise weekly for heart health, and weights twice weekly for bone health Stay active - any steps are better than no steps! Aim for 7-9 hours of sleep daily   Sleep: This provides your body with the reset and relaxation that it needs!  Aim to get 7-8 hours of sleep each night. Limit caffeine, screen time, and other distractions prior to bedtime.  Keep your bedroom cool and dark and do not wear heavy clothing to bed or use heavy bed covers - layer if needed.

## 2024-04-09 NOTE — Telephone Encounter (Signed)
 Pharmacy Patient Advocate Encounter   Received notification from Physician's Office that prior authorization for repatha  is required/requested.   Insurance verification completed.   The patient is insured through [employer].   Per test claim: The current 04/09/24 day co-pay is, $34.99   one month.  No PA needed at this time. This test claim was processed through [facility]- copay amounts may vary at other pharmacies due to pharmacy/plan contracts, or as the patient moves through the different stages of their insurance plan.

## 2024-04-09 NOTE — Progress Notes (Signed)
 " Cardiology Office Note:  .   Date:  04/09/2024 ID:  Kristy Cooper, DOB 13-May-1965, MRN [id number] PCP: Catherine Charlies LABOR, DO [facility] Providers Cardiologist:  None   Patient Profile: .      PMH Coronary artery disease CT Calcium  score 02/07/2024 CAC 21 (81st percentile) LM 0, LAD 2.29, LCx 18.6, RCA 0 Type 2 diabetes Hyperlipidemia Hypertriglyceridemia Obesity Depression Former smoker  10 pack year history       History of Present Illness: .   Discussed the use of AI scribe software for clinical note transcription with the patient, who gave verbal consent to proceed.  History of Present Illness Kristy Cooper is a very pleasant 59 year old female who is here for evaluation of coronary artery calcification. Recent CT calcium  scan showed a total score of 21 (81st percentile) with calcification in the left anterior descending artery (2.29) and left circumflex artery (18.6), and no calcification in the right coronary or left main arteries. History of high cholesterol treated with atorvastatin  and Zetia , with concern for elevated triglycerides. She has diabetes treated with metformin , Farxiga , and glipizide . She has shortness of breath when sitting at her sewing station and with climbing stairs, which causes fatigue. She reports occasional sharp chest pain and episodes of lightheadedness and feeling she might pass out, especially when moving from sitting to standing. Her mother has coronary artery disease treated with stents without prior myocardial infarction. Her siblings have no known coronary events or stents. She smoked for about ten years in her youth and quit in her twenties. 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 She focuses her diet on vegetables and salads, limits bread and rice, avoids fast food when possible, and drinks alcohol occasionally, mostly on holidays.  Family history: Her family history includes Arthritis  in her father, maternal aunt, and mother; Breast cancer in her maternal aunt and mother; Colon cancer in her maternal uncle and paternal aunt; Colon cancer (age of onset: 51) in her maternal aunt; Colon polyps in her maternal uncle and paternal aunt; Colon polyps (age of onset: 45) in her mother; Colon polyps (age of onset: 17) in her maternal aunt; Diabetes in her maternal aunt, maternal uncle, mother, and paternal aunt; Heart disease in her mother and paternal aunt; Mental illness in her father and mother.   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 Tries to walk around some during the day Light housework  No results found for: LIPOA   ROS: See HPI       Studies Reviewed: SABRA   EKG Interpretation Date/Time:  Thursday April 09 2024 10:17:34 EST Ventricular Rate:  72 PR Interval:  162 QRS Duration:  80 QT Interval:  416 QTC Calculation: 455 R Axis:   42  Text Interpretation: Normal sinus rhythm Normal ECG When compared with ECG of 17-Nov-2022 08:18, PREVIOUS ECG IS PRESENT Confirmed by Percy Browning [phone]) on 04/09/2024 10:22:59 AM      Risk Assessment/Calculations:             Physical Exam:   VS: BP 112/78 (BP Location: Right Arm, Patient Position: Sitting, Cuff Size: Normal)   Pulse 72   Ht 5' 7 (1.702 m)   Wt 178 lb (80.7 kg)   LMP 10/01/2018   SpO2 98%   BMI 27.88 kg/m   Wt  Readings from Last 3 Encounters:  04/09/24 178 lb (80.7 kg)  03/03/24 180 lb 6.4 oz (81.8 kg)  01/30/24 180 lb 9.6 oz (81.9 kg)     GEN: Well nourished, well developed in no acute distress NECK: No JVD; No carotid bruits CARDIAC: RRR, no murmurs, rubs, gallops RESPIRATORY:  Clear to auscultation without rales, wheezing or rhonchi  ABDOMEN: Soft, non-tender, non-distended EXTREMITIES:  No edema; No deformity     ASSESSMENT AND PLAN: .    Assessment & Plan Coronary artery disease  Cardiac risk CT calcium  score of 21 (81st percentile) with bulk of calcification in LCx, small amount in LAD. She experiences shortness of breath, chest pain, and lightheadedness.  She has additional risk factors of diabetes and hyperlipidemia.  Family history for mother who has had stents, she is still living. BP is well-controlled. She continues to struggle with elevated triglycerides and glucose despite healthy diet.  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 She does not exercise on a consistent basis. EKG today with no acute ST/T abnormality. Due to concerning calcification along with additional risk factors, we will proceed with ischemia evaluation. She generally eats a healthy diet but A1C remains not well controlled.  - Coronary CTA for evaluation of ischemia and mapping of coronary anatomy - Lopressor  100 mg 2 hours prior to CT - BMET today to ensure stable renal function - Heart healthy diet avoiding processed foods, saturated fat, sugar, and other simple carbohydrates encouraged - Be as physically active as possible every day and aim for at least 150 minutes of moderate intensity exercise each week  Type 2 diabetes mellitus   A1C 8.6% on 11/18/23.  Glipizide  was recently added to metformin  and Farxiga .  She feels that GLP-1 agonists were not approved by insurance but is unsure.  She generally eats a lot of home-cooked meals including vegetables, salads, and  fruit and rarely eats processed food. Despite this, glucose has been difficult to control.  - Continue heart healthy diet - Recommend walking for at least 10 minutes after meals to potentially lower blood sugar - Management per PCP   Hyperlipidemia LDL goal < 55 Hypertriglyceridemia Lipid panel completed 11/18/2023 with total cholesterol 163, triglycerides 426, HDL 36, direct LDL 96.  Triglycerides remain elevated despite maximum dose atorvastatin  and Zetia , possibly due to a genetic component. Lengthy discussion about other potential lipid lowering therapies including PCSK9i and Vascepa to lower triglycerides.  - Check coverage of PCSK9i and Vascepa for additional lipid lowering benefits - Recheck lipid panel after 8-12 weeks of therapy if additional agent is added - Heart healthy diet and regular exercise recommended       Dispo: 3 months with me  Signed, Browning Percy, NP-C "

## 2024-04-09 NOTE — Telephone Encounter (Signed)
 Recommend that patient start Repatha  140 mg every 14 days. Please notify her of co-pay through [facility], was much more when I attempted to send to [facility]. Recommend repeat lipid panel prior to next office visit.   Vascepa cost is 167.18 per month. We can start with Repatha  and see how much improvement she gets in her triglycerides prior to starting Vascepa.

## 2024-04-09 NOTE — Telephone Encounter (Signed)
 Pharmacy Patient Advocate Encounter  Received notification from [facility] that Prior Authorization for vascepa brand has been APPROVED from 04/09/24 to 04/09/25. Ran test claim, Copay is $167.18- one month. This test claim was processed through [facility]- copay amounts may vary at other pharmacies due to pharmacy/plan contracts, or as the patient moves through the different stages of their insurance plan.   PA #/Case ID/Reference #: [id number]

## 2024-04-10 ENCOUNTER — Ambulatory Visit (HOSPITAL_BASED_OUTPATIENT_CLINIC_OR_DEPARTMENT_OTHER): Payer: Self-pay | Admitting: Nurse Practitioner

## 2024-04-10 NOTE — Telephone Encounter (Signed)
 This encounter was created in error - please disregard.

## 2024-04-13 ENCOUNTER — Encounter (HOSPITAL_BASED_OUTPATIENT_CLINIC_OR_DEPARTMENT_OTHER): Payer: Self-pay

## 2024-04-17 ENCOUNTER — Telehealth: Payer: Self-pay

## 2024-04-17 NOTE — Telephone Encounter (Signed)
 MyChart sent regarding A1C F/U.

## 2024-04-21 ENCOUNTER — Other Ambulatory Visit (HOSPITAL_COMMUNITY): Payer: Self-pay

## 2024-04-23 ENCOUNTER — Encounter (HOSPITAL_COMMUNITY): Payer: Self-pay

## 2024-04-24 ENCOUNTER — Ambulatory Visit (HOSPITAL_COMMUNITY)
Admission: RE | Admit: 2024-04-24 | Discharge: 2024-04-24 | Disposition: A | Source: Ambulatory Visit | Attending: Nurse Practitioner | Admitting: Nurse Practitioner

## 2024-04-24 DIAGNOSIS — R931 Abnormal findings on diagnostic imaging of heart and coronary circulation: Secondary | ICD-10-CM | POA: Diagnosis present

## 2024-04-24 MED ORDER — IOHEXOL 350 MG/ML SOLN
100.0000 mL | Freq: Once | INTRAVENOUS | Status: AC | PRN
  Administered 2024-04-24: 100 mL via INTRAVENOUS

## 2024-04-24 MED ORDER — DILTIAZEM HCL 25 MG/5ML IV SOLN: 10.0000 mg | INTRAVENOUS | Status: DC | PRN

## 2024-04-24 MED ORDER — NITROGLYCERIN 0.4 MG SL SUBL
0.8000 mg | SUBLINGUAL_TABLET | Freq: Once | SUBLINGUAL | Status: AC
  Administered 2024-04-24: 0.8 mg via SUBLINGUAL

## 2024-04-24 MED ORDER — METOPROLOL TARTRATE 5 MG/5ML IV SOLN
10.0000 mg | Freq: Once | INTRAVENOUS | Status: AC | PRN
  Administered 2024-04-24: 5 mg via INTRAVENOUS

## 2024-04-27 ENCOUNTER — Telehealth (HOSPITAL_BASED_OUTPATIENT_CLINIC_OR_DEPARTMENT_OTHER): Payer: Self-pay | Admitting: *Deleted

## 2024-04-29 ENCOUNTER — Encounter (HOSPITAL_BASED_OUTPATIENT_CLINIC_OR_DEPARTMENT_OTHER): Payer: Self-pay

## 2024-04-29 MED ORDER — ASPIRIN 81 MG PO TBEC: 81.0000 mg | DELAYED_RELEASE_TABLET | Freq: Every day | ORAL | Status: AC

## 2024-04-29 NOTE — Telephone Encounter (Signed)
 Sent to [employer] to advise.

## 2024-07-13 ENCOUNTER — Ambulatory Visit (HOSPITAL_BASED_OUTPATIENT_CLINIC_OR_DEPARTMENT_OTHER): Admitting: Nurse Practitioner
